# Patient Record
Sex: Female | Born: 1990 | Hispanic: No | Marital: Single | Smoking: Never smoker
Health system: Southern US, Community
[De-identification: ages and names within clinical notes are randomized; demographics above are authoritative.]

## PROBLEM LIST (undated history)

## (undated) ENCOUNTER — Inpatient Hospital Stay (HOSPITAL_COMMUNITY): Payer: Self-pay

## (undated) DIAGNOSIS — IMO0002 Reserved for concepts with insufficient information to code with codable children: Secondary | ICD-10-CM

## (undated) DIAGNOSIS — F419 Anxiety disorder, unspecified: Secondary | ICD-10-CM

## (undated) DIAGNOSIS — N201 Calculus of ureter: Secondary | ICD-10-CM

## (undated) DIAGNOSIS — Z9851 Tubal ligation status: Secondary | ICD-10-CM

## (undated) DIAGNOSIS — B379 Candidiasis, unspecified: Secondary | ICD-10-CM

---
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
## 2010-05-17 ENCOUNTER — Emergency Department (HOSPITAL_COMMUNITY): Admission: EM | Admit: 2010-05-17 | Discharge: 2010-05-17 | Payer: Self-pay | Admitting: Emergency Medicine

## 2010-06-15 ENCOUNTER — Emergency Department (HOSPITAL_COMMUNITY): Admission: EM | Admit: 2010-06-15 | Discharge: 2010-06-16 | Payer: Self-pay | Admitting: Emergency Medicine

## 2010-12-23 LAB — URINALYSIS, ROUTINE W REFLEX MICROSCOPIC
Bilirubin Urine: NEGATIVE
Nitrite: NEGATIVE
Specific Gravity, Urine: 1.021 (ref 1.005–1.030)
Urobilinogen, UA: 0.2 mg/dL (ref 0.0–1.0)

## 2010-12-23 LAB — URINE MICROSCOPIC-ADD ON

## 2010-12-23 LAB — POCT PREGNANCY, URINE: Preg Test, Ur: NEGATIVE

## 2011-08-08 ENCOUNTER — Inpatient Hospital Stay (HOSPITAL_COMMUNITY)
Admission: AD | Admit: 2011-08-08 | Discharge: 2011-08-08 | Disposition: A | Discharge status: Home / Self Care | Payer: Medicaid Other | Source: Ambulatory Visit | Attending: Obstetrics & Gynecology | Admitting: Obstetrics & Gynecology

## 2011-08-08 ENCOUNTER — Inpatient Hospital Stay (HOSPITAL_COMMUNITY): Payer: Medicaid Other

## 2011-08-08 ENCOUNTER — Encounter (HOSPITAL_COMMUNITY): Payer: Self-pay | Admitting: *Deleted

## 2011-08-08 DIAGNOSIS — R109 Unspecified abdominal pain: Secondary | ICD-10-CM | POA: Insufficient documentation

## 2011-08-08 DIAGNOSIS — O26899 Other specified pregnancy related conditions, unspecified trimester: Secondary | ICD-10-CM

## 2011-08-08 DIAGNOSIS — O99891 Other specified diseases and conditions complicating pregnancy: Secondary | ICD-10-CM | POA: Insufficient documentation

## 2011-08-08 DIAGNOSIS — N949 Unspecified condition associated with female genital organs and menstrual cycle: Secondary | ICD-10-CM

## 2011-08-08 LAB — WET PREP, GENITAL

## 2011-08-08 LAB — URINALYSIS, ROUTINE W REFLEX MICROSCOPIC
Bilirubin Urine: NEGATIVE
Nitrite: NEGATIVE
Specific Gravity, Urine: 1.015 (ref 1.005–1.030)
pH: 7 (ref 5.0–8.0)

## 2011-08-08 LAB — CBC
MCV: 93.4 fL (ref 78.0–100.0)
Platelets: 257 10*3/uL (ref 150–400)
RBC: 4.38 MIL/uL (ref 3.87–5.11)
WBC: 17.3 10*3/uL — ABNORMAL HIGH (ref 4.0–10.5)

## 2011-08-08 LAB — ABO/RH: ABO/RH(D): O POS

## 2011-08-08 NOTE — ED Provider Notes (Signed)
History     Chief Complaint  Patient presents with  . Abdominal Pain   HPI Marie Holt 20 y.o.  Positive pregnancy test in MAU.  History of periodic stabbing abdominal pain for 3 days.  No vaginal bleeding.  LMP first of Sept.   OB History    Grav Para Term Preterm Abortions TAB SAB Ect Mult Living   0               Past Medical History  Diagnosis Date  . Asthma     Past Surgical History  Procedure Date  . No past surgeries     No family history on file.  History  Substance Use Topics  . Smoking status: Never Smoker   . Smokeless tobacco: Not on file  . Alcohol Use: No    Allergies: No Known Allergies  Prescriptions prior to admission  Medication Sig Dispense Refill  . budesonide-formoterol (SYMBICORT) 160-4.5 MCG/ACT inhaler Inhale 2 puffs into the lungs 2 (two) times daily.          Review of Systems  Gastrointestinal: Positive for abdominal pain.   Physical Exam   Blood pressure 116/75, pulse 86, temperature 98.7 F (37.1 C), temperature source Oral, resp. rate 16, height 5\' 2"  (1.575 m), weight 111 lb 9.6 oz (50.621 kg).  Physical Exam  Nursing note and vitals reviewed. Constitutional: She is oriented to person, place, and time. She appears well-developed and well-nourished.  HENT:  Head: Normocephalic.  Eyes: EOM are normal.  Neck: Neck supple.  GI: Soft. There is no tenderness. There is no rebound and no guarding.  Genitourinary:       Speculum exam: Vagina - Small amount of creamy discharge, no odor Cervix - No contact bleeding Bimanual exam: Cervix closed Uterus mildly tender, 4-5 week size Adnexa non tender, no masses bilaterally GC/Chlam, wet prep done Chaperone present for exam.  Musculoskeletal: Normal range of motion.  Neurological: She is alert and oriented to person, place, and time.  Skin: Skin is warm and dry.  Psychiatric: She has a normal mood and affect.    MAU Course  Procedures Results for orders placed during the  hospital encounter of 08/08/11 (from the past 24 hour(s))  URINALYSIS, ROUTINE W REFLEX MICROSCOPIC     Status: Normal   Collection Time   08/08/11  8:55 PM      Component Value Range   Color, Urine YELLOW  YELLOW    Appearance CLEAR  CLEAR    Specific Gravity, Urine 1.015  1.005 - 1.030    pH 7.0  5.0 - 8.0    Glucose, UA NEGATIVE  NEGATIVE (mg/dL)   Hgb urine dipstick NEGATIVE  NEGATIVE    Bilirubin Urine NEGATIVE  NEGATIVE    Ketones, ur NEGATIVE  NEGATIVE (mg/dL)   Protein, ur NEGATIVE  NEGATIVE (mg/dL)   Urobilinogen, UA 0.2  0.0 - 1.0 (mg/dL)   Nitrite NEGATIVE  NEGATIVE    Leukocytes, UA NEGATIVE  NEGATIVE   POCT PREGNANCY, URINE     Status: Normal   Collection Time   08/08/11  9:16 PM      Component Value Range   Preg Test, Ur POSITIVE    CBC     Status: Abnormal   Collection Time   08/08/11  9:40 PM      Component Value Range   WBC 17.3 (*) 4.0 - 10.5 (K/uL)   RBC 4.38  3.87 - 5.11 (MIL/uL)   Hemoglobin 13.8  12.0 -  15.0 (g/dL)   HCT 16.1  09.6 - 04.5 (%)   MCV 93.4  78.0 - 100.0 (fL)   MCH 31.5  26.0 - 34.0 (pg)   MCHC 33.7  30.0 - 36.0 (g/dL)   RDW 40.9  81.1 - 91.4 (%)   Platelets 257  150 - 400 (K/uL)  ABO/RH     Status: Normal   Collection Time   08/08/11  9:40 PM      Component Value Range   ABO/RH(D) O POS    HCG, QUANTITATIVE, PREGNANCY     Status: Abnormal   Collection Time   08/08/11  9:40 PM      Component Value Range   hCG, Beta Chain, Quant, S 5212 (*) <5 (mIU/mL)  WET PREP, GENITAL     Status: Abnormal   Collection Time   08/08/11 10:00 PM      Component Value Range   Yeast, Wet Prep NONE SEEN  NONE SEEN    Trich, Wet Prep NONE SEEN  NONE SEEN    Clue Cells, Wet Prep MODERATE (*) NONE SEEN    WBC, Wet Prep HPF POC FEW (*) NONE SEEN     MDM Ultrasound Clinical Data: Abdominal pain. Pregnant.  OBSTETRIC <14 WK [place] AND TRANSVAGINAL OB US  Technique: Both transabdominal and transvaginal ultrasound  examinations were performed for complete  evaluation of the  gestation as well as the maternal uterus, adnexal regions, and  pelvic cul-de-sac. Transvaginal technique was performed to assess  early pregnancy.  Comparison: None.  Intrauterine gestational sac: Visualized/normal in shape.  Yolk sac: Not visualized  Embryo: Not visualized  Cardiac Activity: Not visualized  Heart Rate: Not visualized bpm  MSD: 5.2 mm 5 w 0 d  CRL: mm w d [place] EDC:  Maternal uterus/adnexae:  Normal ovaries. Corpus luteum cyst on the left. No free fluid  IMPRESSION:  Gestational sac is present corresponding to 5-week-0-day gestation.  Fetal pole nor yolk sac visualized. Follow-up suggested.    Assessment and Plan  Abdominal pain in pregnancy  Plan: Return on Wed, Nov 7 between 8 am and 12 noon for repeat ultrasound. Return sooner if you have severe pain or severe bleeding.   Marie Holt 08/08/2011, 10:01 PM   Marie Bernheim, NP 08/08/11 2250

## 2011-08-08 NOTE — Progress Notes (Signed)
Pt unknown LMP, thinks it was sometime early September, having lower abd pain x 2-3 days.  Pt reports increased clear discharge, denies bleeding.

## 2011-08-09 LAB — GC/CHLAMYDIA PROBE AMP, GENITAL
Chlamydia, DNA Probe: NEGATIVE
GC Probe Amp, Genital: NEGATIVE

## 2011-08-13 ENCOUNTER — Inpatient Hospital Stay (HOSPITAL_COMMUNITY)
Admission: AD | Admit: 2011-08-13 | Discharge: 2011-08-13 | Disposition: A | Discharge status: Home / Self Care | Payer: Medicaid Other | Source: Ambulatory Visit | Attending: Obstetrics and Gynecology | Admitting: Obstetrics and Gynecology

## 2011-08-13 ENCOUNTER — Encounter (HOSPITAL_COMMUNITY): Payer: Self-pay

## 2011-08-13 DIAGNOSIS — O210 Mild hyperemesis gravidarum: Secondary | ICD-10-CM | POA: Insufficient documentation

## 2011-08-13 DIAGNOSIS — O211 Hyperemesis gravidarum with metabolic disturbance: Secondary | ICD-10-CM

## 2011-08-13 LAB — URINALYSIS, ROUTINE W REFLEX MICROSCOPIC
Bilirubin Urine: NEGATIVE
Glucose, UA: NEGATIVE mg/dL
Specific Gravity, Urine: >1.03 — ABNORMAL HIGH (ref 1.005–1.030)
pH: 6 (ref 5.0–8.0)

## 2011-08-13 LAB — COMPREHENSIVE METABOLIC PANEL
ALT: 9 U/L (ref 0–35)
CO2: 24 mEq/L (ref 19–32)
Calcium: 10.6 mg/dL — ABNORMAL HIGH (ref 8.4–10.5)
Creatinine, Ser: 0.65 mg/dL (ref 0.50–1.10)
GFR calc Af Amer: >90 mL/min (ref >90)
GFR calc non Af Amer: >90 mL/min (ref >90)
Glucose, Bld: 92 mg/dL (ref 70–99)
Total Bilirubin: 1.3 mg/dL — ABNORMAL HIGH (ref 0.3–1.2)

## 2011-08-13 LAB — URINE MICROSCOPIC-ADD ON

## 2011-08-13 LAB — CBC
Hemoglobin: 14 g/dL (ref 12.0–15.0)
MCH: 31.8 pg (ref 26.0–34.0)
MCV: 93.6 fL (ref 78.0–100.0)
RBC: 4.4 MIL/uL (ref 3.87–5.11)

## 2011-08-13 MED ORDER — PROMETHAZINE HCL 25 MG/ML IJ SOLN
25.0000 mg | Freq: Once | INTRAVENOUS | Status: AC
  Administered 2011-08-13: 25 mg via INTRAVENOUS
  Filled 2011-08-13: qty 1

## 2011-08-13 MED ORDER — ONDANSETRON 8 MG PO TBDP: 8.0000 mg | ORAL_TABLET | Freq: Three times a day (TID) | ORAL | Status: AC | PRN

## 2011-08-13 MED ORDER — ONDANSETRON HCL 4 MG/2ML IJ SOLN
4.0000 mg | Freq: Once | INTRAMUSCULAR | Status: AC
  Administered 2011-08-13: 4 mg via INTRAVENOUS
  Filled 2011-08-13: qty 2

## 2011-08-13 MED ORDER — PROMETHAZINE HCL 25 MG PO TABS: 25.0000 mg | ORAL_TABLET | Freq: Four times a day (QID) | ORAL | Status: DC | PRN

## 2011-08-13 NOTE — Progress Notes (Signed)
Since yesterday morning vomiting, can't keep anything down, has not started prenatal care.

## 2011-08-13 NOTE — ED Provider Notes (Signed)
History     Chief Complaint  Patient presents with  . Morning Sickness   HPI Marie Holt 20 y.o. 5w 4d gestation.  Started vomiting repeatedly yesterday and is not able to keep down food or fluids.  No medications at home.   OB History    Grav Para Term Preterm Abortions TAB SAB Ect Mult Living   1               Past Medical History  Diagnosis Date  . Asthma     Past Surgical History  Procedure Date  . No past surgeries     No family history on file.  History  Substance Use Topics  . Smoking status: Never Smoker   . Smokeless tobacco: Not on file  . Alcohol Use: No    Allergies: No Known Allergies  Prescriptions prior to admission  Medication Sig Dispense Refill  . budesonide-formoterol (SYMBICORT) 160-4.5 MCG/ACT inhaler Inhale 2 puffs into the lungs 2 (two) times daily.          Review of Systems  Gastrointestinal: Positive for nausea and vomiting.   Physical Exam   Blood pressure 117/69, pulse 107, temperature 97.9 F (36.6 C), temperature source Oral, resp. rate 16, height 5\' 2"  (1.575 m), weight 107 lb (48.535 kg).  Physical Exam  Nursing note and vitals reviewed. Constitutional: She is oriented to person, place, and time. She appears well-developed and well-nourished.  HENT:  Head: Normocephalic.  Eyes: EOM are normal.  Neck: Neck supple.  GI: Soft. There is no tenderness.  Musculoskeletal: Normal range of motion.  Neurological: She is alert and oriented to person, place, and time.  Skin: Skin is warm and dry.  Psychiatric: She has a normal mood and affect.    MAU Course  Procedures  MDM Results for orders placed during the hospital encounter of 08/13/11 (from the past 24 hour(s))  URINALYSIS, ROUTINE W REFLEX MICROSCOPIC     Status: Abnormal   Collection Time   08/13/11  7:54 AM      Component Value Range   Color, Urine YELLOW  YELLOW    Appearance HAZY (*) CLEAR    Specific Gravity, Urine >1.030 (*) 1.005 - 1.030    pH 6.0  5.0 -  8.0    Glucose, UA NEGATIVE  NEGATIVE (mg/dL)   Hgb urine dipstick TRACE (*) NEGATIVE    Bilirubin Urine NEGATIVE  NEGATIVE    Ketones, ur >80 (*) NEGATIVE (mg/dL)   Protein, ur NEGATIVE  NEGATIVE (mg/dL)   Urobilinogen, UA 0.2  0.0 - 1.0 (mg/dL)   Nitrite NEGATIVE  NEGATIVE    Leukocytes, UA NEGATIVE  NEGATIVE   URINE MICROSCOPIC-ADD ON     Status: Abnormal   Collection Time   08/13/11  7:54 AM      Component Value Range   Squamous Epithelial / LPF FEW (*) RARE    WBC, UA 0-2  <3 (WBC/hpf)   RBC / HPF 3-6  <3 (RBC/hpf)   Bacteria, UA FEW (*) RARE    Urine-Other MUCOUS PRESENT    CBC     Status: Abnormal   Collection Time   08/13/11  8:30 AM      Component Value Range   WBC 14.7 (*) 4.0 - 10.5 (K/uL)   RBC 4.40  3.87 - 5.11 (MIL/uL)   Hemoglobin 14.0  12.0 - 15.0 (g/dL)   HCT 16.1  09.6 - 04.5 (%)   MCV 93.6  78.0 - 100.0 (fL)  MCH 31.8  26.0 - 34.0 (pg)   MCHC 34.0  30.0 - 36.0 (g/dL)   RDW 08.6  57.8 - 46.9 (%)   Platelets 242  150 - 400 (K/uL)  COMPREHENSIVE METABOLIC PANEL     Status: Abnormal   Collection Time   08/13/11  8:30 AM      Component Value Range   Sodium 132 (*) 135 - 145 (mEq/L)   Potassium 4.1  3.5 - 5.1 (mEq/L)   Chloride 97  96 - 112 (mEq/L)   CO2 24  19 - 32 (mEq/L)   Glucose, Bld 92  70 - 99 (mg/dL)   BUN 10  6 - 23 (mg/dL)   Creatinine, Ser 6.29  0.50 - 1.10 (mg/dL)   Calcium 52.8 (*) 8.4 - 10.5 (mg/dL)   Total Protein 7.4  6.0 - 8.3 (g/dL)   Albumin 4.4  3.5 - 5.2 (g/dL)   AST 13  0 - 37 (U/L)   ALT 9  0 - 35 (U/L)   Alkaline Phosphatase 73  39 - 117 (U/L)   Total Bilirubin 1.3 (*) 0.3 - 1.2 (mg/dL)   GFR calc non Af Amer >90  >90 (mL/min)   GFR calc Af Amer >90  >90 (mL/min)   Urine culture pending - blood noted on micro Phenergan in 1000cc LR given with Zofran 4 mg IV also.  Client able to keep down ice chips well.  Assessment and Plan  Hyperemesis  Plan: Will prescribe Zofran ODT and Phenergan tablets to use at home. Begin prenatal  care as soon as possible. Drink at least 8 8-oz glasses of water every day.   Dylyn Mclaren 08/13/2011, 8:25 AM   [name], NP 08/13/11 1043

## 2011-08-14 LAB — URINE CULTURE

## 2011-08-14 NOTE — ED Provider Notes (Signed)
Attestation of Attending Supervision of Advanced Practitioner: Evaluation and management procedures were performed by the PA/NP/CNM/OB Fellow under my supervision/collaboration. Chart reviewed and agree with management and plan.  Kendyn Zaman V 08/14/2011 9:14 AM

## 2011-08-16 ENCOUNTER — Inpatient Hospital Stay (HOSPITAL_COMMUNITY): Payer: Self-pay

## 2011-08-16 ENCOUNTER — Inpatient Hospital Stay (HOSPITAL_COMMUNITY)
Admission: AD | Admit: 2011-08-16 | Discharge: 2011-08-16 | Disposition: A | Discharge status: Home / Self Care | Payer: Self-pay | Source: Ambulatory Visit | Attending: Obstetrics & Gynecology | Admitting: Obstetrics & Gynecology

## 2011-08-16 ENCOUNTER — Encounter (HOSPITAL_COMMUNITY): Payer: Self-pay | Admitting: *Deleted

## 2011-08-16 DIAGNOSIS — Z3490 Encounter for supervision of normal pregnancy, unspecified, unspecified trimester: Secondary | ICD-10-CM

## 2011-08-16 DIAGNOSIS — Z1389 Encounter for screening for other disorder: Secondary | ICD-10-CM

## 2011-08-16 DIAGNOSIS — Z363 Encounter for antenatal screening for malformations: Secondary | ICD-10-CM

## 2011-08-16 DIAGNOSIS — O99891 Other specified diseases and conditions complicating pregnancy: Secondary | ICD-10-CM | POA: Insufficient documentation

## 2011-08-16 NOTE — ED Provider Notes (Signed)
History     Chief Complaint  Patient presents with  . Follow-up   HPI Marie Holt 20 y.o. 6w 0d gestation.  Comes to MAU today for repeat ultrasound.   OB History    Grav Para Term Preterm Abortions TAB SAB Ect Mult Living   1               Past Medical History  Diagnosis Date  . Asthma     Past Surgical History  Procedure Date  . No past surgeries     Family History  Problem Relation Age of Onset  . Anesthesia problems Neg Hx     History  Substance Use Topics  . Smoking status: Never Smoker   . Smokeless tobacco: Never Used  . Alcohol Use: No    Allergies: No Known Allergies  Prescriptions prior to admission  Medication Sig Dispense Refill  . budesonide-formoterol (SYMBICORT) 160-4.5 MCG/ACT inhaler Inhale 2 puffs into the lungs 2 (two) times daily.        . ondansetron (ZOFRAN ODT) 8 MG disintegrating tablet Take 1 tablet (8 mg total) by mouth every 8 (eight) hours as needed for nausea.  20 tablet  0  . promethazine (PHENERGAN) 25 MG tablet Take 1 tablet (25 mg total) by mouth every 6 (six) hours as needed for nausea. May take 1/2 tablet for milder symptoms.  Sedation precautions.  30 tablet  0    ROS Physical Exam   Blood pressure 111/72, pulse 98, temperature 98.4 F (36.9 C), temperature source Oral, resp. rate 18, SpO2 98.00%.  Physical Exam  MAU Course  Procedures  6w 1d viable pregnancy  Assessment and Plan  Viable pregnancy  Plan: Begin prenatal care   Neil Brickell 08/16/2011, 8:43 AM   [name], NP 08/16/11 [phone]

## 2011-08-16 NOTE — Progress Notes (Signed)
Here today for viability scan.  Doing ok, feels sick- is taking meds which do help.  No pain or bleeding.

## 2011-08-17 NOTE — ED Provider Notes (Signed)
Attestation of Attending Supervision of Advanced Practitioner: Evaluation and management procedures were performed by the PA/NP/CNM/OB Fellow under my supervision/collaboration. Chart reviewed, and agree with management and plan.  Wayburn Shaler A M.D. 08/17/2011 1:27 PM   

## 2011-10-10 NOTE — L&D Delivery Note (Signed)
Delivery Note  Cervix was complete at 0111 and pt was feeling inc pressure, FHR remained reassuring w mod variable decels, pt began pushing at 0120,   At 2:04 AM a viable female was delivered via Vaginal, Spontaneous Delivery (Presentation: Right Occiput Anterior). Somersault  Through loose nuchal cord, shoulders deliv easily  APGAR: 9, 10; weight .pending   Placenta status: Intact, Spontaneous.  Cord: 3 vessels with the following complications: None.  Cord pH: n/a cytotec PR for inc vag bleeding  Anesthesia: Epidural  Episiotomy: Midline Lacerations: 2nd degree Suture Repair: 3.0 vicryl rapide Est. Blood Loss (mL): 300  Mom to postpartum.  Baby to nursery-stable . Skin-skin w mom Mom plans to BF  Marie Holt M 04/01/2012, 2:35 AM

## 2011-12-11 ENCOUNTER — Encounter: Payer: Self-pay | Admitting: Obstetrics and Gynecology

## 2011-12-15 ENCOUNTER — Encounter (INDEPENDENT_AMBULATORY_CARE_PROVIDER_SITE_OTHER): Payer: Medicaid Other | Admitting: Obstetrics and Gynecology

## 2011-12-15 DIAGNOSIS — Z3400 Encounter for supervision of normal first pregnancy, unspecified trimester: Secondary | ICD-10-CM

## 2011-12-18 ENCOUNTER — Encounter: Payer: Self-pay | Admitting: Obstetrics and Gynecology

## 2012-01-12 ENCOUNTER — Encounter: Payer: Medicaid Other | Admitting: Obstetrics and Gynecology

## 2012-01-15 ENCOUNTER — Encounter: Payer: Medicaid Other | Admitting: Registered Nurse

## 2012-01-29 ENCOUNTER — Encounter: Payer: Self-pay | Admitting: Obstetrics and Gynecology

## 2012-01-29 ENCOUNTER — Encounter: Payer: Medicaid Other | Admitting: Obstetrics and Gynecology

## 2012-01-29 DIAGNOSIS — O0930 Supervision of pregnancy with insufficient antenatal care, unspecified trimester: Secondary | ICD-10-CM | POA: Insufficient documentation

## 2012-01-29 DIAGNOSIS — N926 Irregular menstruation, unspecified: Secondary | ICD-10-CM | POA: Insufficient documentation

## 2012-01-29 DIAGNOSIS — J45909 Unspecified asthma, uncomplicated: Secondary | ICD-10-CM | POA: Insufficient documentation

## 2012-01-29 DIAGNOSIS — K089 Disorder of teeth and supporting structures, unspecified: Secondary | ICD-10-CM | POA: Insufficient documentation

## 2012-02-19 ENCOUNTER — Encounter: Payer: Self-pay | Admitting: Obstetrics and Gynecology

## 2012-02-19 ENCOUNTER — Ambulatory Visit (INDEPENDENT_AMBULATORY_CARE_PROVIDER_SITE_OTHER): Payer: Medicaid Other | Admitting: Obstetrics and Gynecology

## 2012-02-19 VITALS — BP 98/70 | Wt 122.0 lb

## 2012-02-19 DIAGNOSIS — Z331 Pregnant state, incidental: Secondary | ICD-10-CM

## 2012-02-19 NOTE — Progress Notes (Signed)
Patient ID: Marie Holt, female   DOB: 1991/04/14, 21 y.o.   MRN: [id number] EGBUS perineum wnl, neg clue, trich, and hyphae discussed. Reviewed s/s preterm labor, srom, vag bleeding, kick counts to report, enc 8 water daily and frequent voids 1 gtt, rpr, hgb today is RH positive Lavera Guise, CNM

## 2012-02-19 NOTE — Progress Notes (Signed)
Pt c/o of discharge & itching

## 2012-02-20 LAB — CBC
MCV: 96.3 fL (ref 78.0–100.0)
Platelets: 226 10*3/uL (ref 150–400)
RDW: 12.8 % (ref 11.5–15.5)
WBC: 8.9 10*3/uL (ref 4.0–10.5)

## 2012-02-20 LAB — RPR

## 2012-02-26 ENCOUNTER — Telehealth: Payer: Self-pay | Admitting: Obstetrics and Gynecology

## 2012-02-26 NOTE — Telephone Encounter (Signed)
Incoming call to CNM, pt c/o "swolen" vagina since yesterday, denies any pain, ctx, VB or LOF, no vag d/c, +FM Pt is [redacted]w[redacted]d Per M.Krebsbach, CNM exam 1wk ago normal rec no use of scented body wash, switch to plain dial soap Reassured pt and to call for [place] visit in AM if not improved overnight

## 2012-02-27 ENCOUNTER — Ambulatory Visit (INDEPENDENT_AMBULATORY_CARE_PROVIDER_SITE_OTHER): Payer: Medicaid Other | Admitting: Obstetrics and Gynecology

## 2012-02-27 ENCOUNTER — Encounter: Payer: Self-pay | Admitting: Obstetrics and Gynecology

## 2012-02-27 ENCOUNTER — Telehealth: Payer: Self-pay | Admitting: Obstetrics and Gynecology

## 2012-02-27 VITALS — BP 106/56 | Wt 123.0 lb

## 2012-02-27 DIAGNOSIS — B373 Candidiasis of vulva and vagina: Secondary | ICD-10-CM

## 2012-02-27 DIAGNOSIS — O26899 Other specified pregnancy related conditions, unspecified trimester: Secondary | ICD-10-CM

## 2012-02-27 DIAGNOSIS — O2343 Unspecified infection of urinary tract in pregnancy, third trimester: Secondary | ICD-10-CM

## 2012-02-27 DIAGNOSIS — N898 Other specified noninflammatory disorders of vagina: Secondary | ICD-10-CM

## 2012-02-27 DIAGNOSIS — Z331 Pregnant state, incidental: Secondary | ICD-10-CM

## 2012-02-27 DIAGNOSIS — O9989 Other specified diseases and conditions complicating pregnancy, childbirth and the puerperium: Secondary | ICD-10-CM

## 2012-02-27 DIAGNOSIS — Z283 Underimmunization status: Secondary | ICD-10-CM | POA: Insufficient documentation

## 2012-02-27 LAB — POCT WET PREP (WET MOUNT): Clue Cells Wet Prep Whiff POC: NEGATIVE

## 2012-02-27 LAB — POCT URINALYSIS DIPSTICK
Blood, UA: NEGATIVE
Glucose, UA: NEGATIVE
Nitrite, UA: POSITIVE
Urobilinogen, UA: NEGATIVE

## 2012-02-27 MED ORDER — NITROFURANTOIN MONOHYD MACRO 100 MG PO CAPS: 100.0000 mg | ORAL_CAPSULE | Freq: Two times a day (BID) | ORAL | Status: AC

## 2012-02-27 MED ORDER — NYSTATIN-TRIAMCINOLONE 100000-0.1 UNIT/GM-% EX OINT: TOPICAL_OINTMENT | Freq: Three times a day (TID) | CUTANEOUS | Status: DC | PRN

## 2012-02-27 MED ORDER — TERCONAZOLE 0.4 % VA CREA: 1.0000 | TOPICAL_CREAM | Freq: Every day | VAGINAL | Status: DC

## 2012-02-27 NOTE — Progress Notes (Signed)
Pt complaining of thick white discharge and swollen with itching x 3days JM

## 2012-02-27 NOTE — Progress Notes (Signed)
U/A with 2+ leuk and positive nitrites: to culture, Macrobid BID for 7 days Wet prep: Hyphae : Terazol 7 and Mycolog II GBS at next visit S<D with poor weigh gain: ultrasound ordered

## 2012-02-28 LAB — CULTURE, OB URINE: Colony Count: NO GROWTH

## 2012-03-05 ENCOUNTER — Ambulatory Visit (INDEPENDENT_AMBULATORY_CARE_PROVIDER_SITE_OTHER): Payer: Medicaid Other | Admitting: Obstetrics and Gynecology

## 2012-03-05 ENCOUNTER — Encounter: Payer: Self-pay | Admitting: Obstetrics and Gynecology

## 2012-03-05 ENCOUNTER — Ambulatory Visit (INDEPENDENT_AMBULATORY_CARE_PROVIDER_SITE_OTHER): Payer: Medicaid Other

## 2012-03-05 VITALS — BP 108/60 | Ht 62.0 in | Wt 122.0 lb

## 2012-03-05 DIAGNOSIS — K089 Disorder of teeth and supporting structures, unspecified: Secondary | ICD-10-CM

## 2012-03-05 DIAGNOSIS — R110 Nausea: Secondary | ICD-10-CM

## 2012-03-05 DIAGNOSIS — B49 Unspecified mycosis: Secondary | ICD-10-CM

## 2012-03-05 DIAGNOSIS — B379 Candidiasis, unspecified: Secondary | ICD-10-CM

## 2012-03-05 DIAGNOSIS — O26899 Other specified pregnancy related conditions, unspecified trimester: Secondary | ICD-10-CM

## 2012-03-05 DIAGNOSIS — B373 Candidiasis of vulva and vagina: Secondary | ICD-10-CM

## 2012-03-05 DIAGNOSIS — O0930 Supervision of pregnancy with insufficient antenatal care, unspecified trimester: Secondary | ICD-10-CM

## 2012-03-05 DIAGNOSIS — O2610 Low weight gain in pregnancy, unspecified trimester: Secondary | ICD-10-CM | POA: Insufficient documentation

## 2012-03-05 DIAGNOSIS — O3660X0 Maternal care for excessive fetal growth, unspecified trimester, not applicable or unspecified: Secondary | ICD-10-CM

## 2012-03-05 LAB — POCT WET PREP (WET MOUNT)
Bacteria Wet Prep HPF POC: NEGATIVE
Source Wet Prep POC: NEGATIVE
WBC, Wet Prep HPF POC: NEGATIVE

## 2012-03-05 LAB — US OB FOLLOW UP

## 2012-03-05 MED ORDER — RANITIDINE HCL 150 MG PO TABS: 150.0000 mg | ORAL_TABLET | Freq: Two times a day (BID) | ORAL | Status: DC

## 2012-03-05 MED ORDER — TERCONAZOLE 0.4 % VA CREA: 1.0000 | TOPICAL_CREAM | Freq: Every day | VAGINAL | Status: AC

## 2012-03-05 MED ORDER — ENSURE NUTRA SHAKE HI-CAL PO LIQD: 1.0000 | Freq: Every day | ORAL | Status: DC

## 2012-03-05 NOTE — Patient Instructions (Signed)
High Protein, High Calorie Diet  A high protein, high calorie diet increases the amount of protein and calories you eat. You may need more protein and calories in your diet because of illness, surgery, injury, weight loss, or having a poor appetite. Eating high protein and high calorie foods can help you gain weight, heal, and recover after illness.    SERVING SIZES  Measuring foods and serving sizes helps to make sure you are getting the right amount of food. The list below tells how big or small some common serving sizes are.     1 oz.........4 stacked dice.    3 oz.........Deck of cards.    1 tsp........Tip of little finger.    1 tbs........Thumb.    2 tbs........Golf ball.     cup.......Half of a fist.    1 cup........A fist.   HIGH PROTEIN FOODS  Dairy   Whole milk.    Whole milk yogurt.    Powdered milk.    Cheese.    Cottage Cheese.    Instant breakfast products.    Eggnog.   Tips for adding to your diet:   Use whole milk when making hot cereal, puddings, soups, and hot cocoa.    Add powdered milk to baked goods, smoothies, and milkshakes.    Make whole milk yogurt parfaits by adding granola, fruit, or nuts.    Add cheese to sandwiches, pastas, soups, and casseroles.    Add fruit to cottage cheese.   Meat    Beef, pork, and poultry.    Fish and seafood.    Peanut butter.    Dried beans.    Eggs.   Tips for adding to your diet:   Make meat and cheese omelets.    Add eggs to salads and baked goods.    Add fish and seafood to salads.    Add meat and poultry to casseroles, salads, and soups.    Use peanut butter as a topping for pretzels, celery, crackers, or add it to baked goods.    Use beans in casseroles, dips, and spreads.   GENERAL GUIDELINES TO INCREASE CALORIES   Replace calorie-free drinks with calorie-containing drinks, such as milk, fruit juices, regular soda, milkshakes, and hot chocolate.    Try to eat 6 small meals instead of 3 large meals each day.     Keep snacks handy, such as nuts, trail mixes, dried fruit, and yogurt.    Choose foods with sauces and gravies.    Add dried fruits, honey, and half-and-half to hot or cold cereal.    Add extra fats when possible, such as butter, sour cream, cream cheese, and salad dressings.    Add cheese to foods often.    Consider adding a clear liquid nutritional supplement to your diet. Your caregiver can give you recommendations.   HIGH CALORIE FOODS  Grain/Starch   Baked goods, such as muffins and quick breads.    Croissants.    Pancakes and waffles.   Vegetable    Sauted vegetables in oil.    Fried vegetables.    Salad greens with regular salad dressing or vinegar and oil.   Fruit   Dried fruit.    Canned fruit in syrup.    Fruit juice.   Fat   Avocado.    Butter or margarine.    Whipped cream.    Mayonnaise.    Salad dressing.    Peanuts and mixed nuts.    Cream cheese and sour cream.   

## 2012-03-05 NOTE — Progress Notes (Signed)
1) [place] EFW  5lb 3 oz 27.6%ile Nl fluid vtx  2) Poor wt gain.  Pt states nausea as soon as she starts eating makes her unable to eat well Rec;  a) Zantac bid  b) Ensure qd  C) high calorie, high protein diet instructions 3) Irritation.  Wet Prep= yeast.   Rec: Repeat Terazol 7 4) Checklist completed

## 2012-03-19 ENCOUNTER — Encounter: Payer: Self-pay | Admitting: Obstetrics and Gynecology

## 2012-03-19 ENCOUNTER — Ambulatory Visit (INDEPENDENT_AMBULATORY_CARE_PROVIDER_SITE_OTHER): Payer: Medicaid Other | Admitting: Obstetrics and Gynecology

## 2012-03-19 ENCOUNTER — Encounter: Payer: Medicaid Other | Admitting: Obstetrics and Gynecology

## 2012-03-19 VITALS — BP 98/58 | Wt 125.0 lb

## 2012-03-19 DIAGNOSIS — O368190 Decreased fetal movements, unspecified trimester, not applicable or unspecified: Secondary | ICD-10-CM

## 2012-03-19 DIAGNOSIS — N898 Other specified noninflammatory disorders of vagina: Secondary | ICD-10-CM

## 2012-03-19 DIAGNOSIS — Z331 Pregnant state, incidental: Secondary | ICD-10-CM

## 2012-03-19 LAB — POCT WET PREP (WET MOUNT)

## 2012-03-19 MED ORDER — TERCONAZOLE 80 MG VA SUPP: 80.0000 mg | Freq: Every day | VAGINAL | Status: DC

## 2012-03-19 NOTE — Progress Notes (Addendum)
Pt c/o vaginal itching and irritation and does not think the rx for yeast infection is working.  Pt also c/o decreased fetal movement BP 98/58  Wt 125 lb (56.7 kg) V/v white d/c wet prep c/w yeast  terazol rx given NST for decreased fetal movemnt FK reviewed NST 130s reacvtive   No ctx

## 2012-03-19 NOTE — Patient Instructions (Signed)
Fetal Movement Counts Patient Name: __________________________________________________ Patient Due Date: ____________________ Kick counts is highly recommended in high risk pregnancies, but it is a good idea for every pregnant woman to do. Start counting fetal movements at 28 weeks of the pregnancy. Fetal movements increase after eating a full meal or eating or drinking something sweet (the blood sugar is higher). It is also important to drink plenty of fluids (well hydrated) before doing the count. Lie on your left side because it helps with the circulation or you can sit in a comfortable chair with your arms over your belly (abdomen) with no distractions around you. DOING THE COUNT  Try to do the count the same time of day each time you do it.   Mark the day and time, then see how long it takes for you to feel 10 movements (kicks, flutters, swishes, rolls). You should have at least 10 movements within 2 hours. You will most likely feel 10 movements in much less than 2 hours. If you do not, wait an hour and count again. After a couple of days you will see a pattern.   What you are looking for is a change in the pattern or not enough counts in 2 hours. Is it taking longer in time to reach 10 movements?  SEEK MEDICAL CARE IF:  You feel less than 10 counts in 2 hours. Tried twice.   No movement in one hour.   The pattern is changing or taking longer each day to reach 10 counts in 2 hours.   You feel the baby is not moving as it usually does.  Date: ____________ Movements: ____________ Start time: ____________ Finish time: ____________  Date: ____________ Movements: ____________ Start time: ____________ Finish time: ____________ Date: ____________ Movements: ____________ Start time: ____________ Finish time: ____________ Date: ____________ Movements: ____________ Start time: ____________ Finish time: ____________ Date: ____________ Movements: ____________ Start time: ____________ Finish time:  ____________ Date: ____________ Movements: ____________ Start time: ____________ Finish time: ____________ Date: ____________ Movements: ____________ Start time: ____________ Finish time: ____________ Date: ____________ Movements: ____________ Start time: ____________ Finish time: ____________  Date: ____________ Movements: ____________ Start time: ____________ Finish time: ____________ Date: ____________ Movements: ____________ Start time: ____________ Finish time: ____________ Date: ____________ Movements: ____________ Start time: ____________ Finish time: ____________ Date: ____________ Movements: ____________ Start time: ____________ Finish time: ____________ Date: ____________ Movements: ____________ Start time: ____________ Finish time: ____________ Date: ____________ Movements: ____________ Start time: ____________ Finish time: ____________ Date: ____________ Movements: ____________ Start time: ____________ Finish time: ____________  Date: ____________ Movements: ____________ Start time: ____________ Finish time: ____________ Date: ____________ Movements: ____________ Start time: ____________ Finish time: ____________ Date: ____________ Movements: ____________ Start time: ____________ Finish time: ____________ Date: ____________ Movements: ____________ Start time: ____________ Finish time: ____________ Date: ____________ Movements: ____________ Start time: ____________ Finish time: ____________ Date: ____________ Movements: ____________ Start time: ____________ Finish time: ____________ Date: ____________ Movements: ____________ Start time: ____________ Finish time: ____________  Date: ____________ Movements: ____________ Start time: ____________ Finish time: ____________ Date: ____________ Movements: ____________ Start time: ____________ Finish time: ____________ Date: ____________ Movements: ____________ Start time: ____________ Finish time: ____________ Date: ____________ Movements:  ____________ Start time: ____________ Finish time: ____________ Date: ____________ Movements: ____________ Start time: ____________ Finish time: ____________ Date: ____________ Movements: ____________ Start time: ____________ Finish time: ____________ Date: ____________ Movements: ____________ Start time: ____________ Finish time: ____________  Date: ____________ Movements: ____________ Start time: ____________ Finish time: ____________ Date: ____________ Movements: ____________ Start time: ____________ Finish time: ____________ Date: ____________ Movements: ____________ Start time:   ____________ Finish time: ____________ Date: ____________ Movements: ____________ Start time: ____________ Finish time: ____________ Date: ____________ Movements: ____________ Start time: ____________ Finish time: ____________ Date: ____________ Movements: ____________ Start time: ____________ Finish time: ____________ Date: ____________ Movements: ____________ Start time: ____________ Finish time: ____________  Date: ____________ Movements: ____________ Start time: ____________ Finish time: ____________ Date: ____________ Movements: ____________ Start time: ____________ Finish time: ____________ Date: ____________ Movements: ____________ Start time: ____________ Finish time: ____________ Date: ____________ Movements: ____________ Start time: ____________ Finish time: ____________ Date: ____________ Movements: ____________ Start time: ____________ Finish time: ____________ Date: ____________ Movements: ____________ Start time: ____________ Finish time: ____________ Date: ____________ Movements: ____________ Start time: ____________ Finish time: ____________  Date: ____________ Movements: ____________ Start time: ____________ Finish time: ____________ Date: ____________ Movements: ____________ Start time: ____________ Finish time: ____________ Date: ____________ Movements: ____________ Start time: ____________ Finish  time: ____________ Date: ____________ Movements: ____________ Start time: ____________ Finish time: ____________ Date: ____________ Movements: ____________ Start time: ____________ Finish time: ____________ Date: ____________ Movements: ____________ Start time: ____________ Finish time: ____________ Date: ____________ Movements: ____________ Start time: ____________ Finish time: ____________  Date: ____________ Movements: ____________ Start time: ____________ Finish time: ____________ Date: ____________ Movements: ____________ Start time: ____________ Finish time: ____________ Date: ____________ Movements: ____________ Start time: ____________ Finish time: ____________ Date: ____________ Movements: ____________ Start time: ____________ Finish time: ____________ Date: ____________ Movements: ____________ Start time: ____________ Finish time: ____________ Date: ____________ Movements: ____________ Start time: ____________ Finish time: ____________ Document Released: 10/25/2006 Document Revised: 09/14/2011 Document Reviewed: 04/27/2009 [employer] Patient Information 2012 [employer]. 

## 2012-03-21 ENCOUNTER — Other Ambulatory Visit: Payer: Self-pay | Admitting: Obstetrics and Gynecology

## 2012-03-21 ENCOUNTER — Telehealth: Payer: Self-pay | Admitting: Obstetrics and Gynecology

## 2012-03-21 MED ORDER — FLUCONAZOLE 150 MG PO TABS: 150.0000 mg | ORAL_TABLET | Freq: Once | ORAL | Status: AC

## 2012-03-21 NOTE — Telephone Encounter (Signed)
Triage/2nd call 

## 2012-03-21 NOTE — Telephone Encounter (Signed)
PT CALLED, STATES HAS YEAST INFECTION AND HAS NOT GOTTEN ANY RELIEF FROM THE TEREZOL 3 THAT WAS RX'D TO HER AND HAS WORSENED SXS.  PT HAS VAGINAL REDNESS, BURNING AND ITCHING AND IS PAINFUL, ALSO HAS A LOT OF WHITE D/C.  PT DENIES ANY BLDG, DOES HAVE +FM.  PER VL STOP TEREZOL, CAN RX DIFLUCAN 150MG  AND MYCOLOG CREAM.  DIFLUCAN WAS E-PRESCRIBED TO PT'S PHARM, PT ALREADY HAS RX FOR MYCOLOG CREAM.  PT TO CALL BACK IF NO RELIEF OR ANY CONCERNS.

## 2012-03-21 NOTE — Telephone Encounter (Signed)
Triage/epic 

## 2012-03-22 LAB — CULTURE, BETA STREP (GROUP B ONLY)

## 2012-03-26 ENCOUNTER — Ambulatory Visit (INDEPENDENT_AMBULATORY_CARE_PROVIDER_SITE_OTHER): Payer: Medicaid Other | Admitting: Obstetrics and Gynecology

## 2012-03-26 ENCOUNTER — Telehealth: Payer: Self-pay | Admitting: Obstetrics and Gynecology

## 2012-03-26 ENCOUNTER — Ambulatory Visit (INDEPENDENT_AMBULATORY_CARE_PROVIDER_SITE_OTHER): Payer: Medicaid Other

## 2012-03-26 ENCOUNTER — Encounter: Payer: Medicaid Other | Admitting: Obstetrics and Gynecology

## 2012-03-26 ENCOUNTER — Inpatient Hospital Stay (HOSPITAL_COMMUNITY)
Admission: AD | Admit: 2012-03-26 | Discharge: 2012-03-26 | Disposition: A | Discharge status: Home / Self Care | Payer: Medicaid Other | Source: Ambulatory Visit | Attending: Obstetrics and Gynecology | Admitting: Obstetrics and Gynecology

## 2012-03-26 ENCOUNTER — Other Ambulatory Visit: Payer: Self-pay | Admitting: Obstetrics and Gynecology

## 2012-03-26 ENCOUNTER — Encounter (HOSPITAL_COMMUNITY): Payer: Self-pay

## 2012-03-26 DIAGNOSIS — O368190 Decreased fetal movements, unspecified trimester, not applicable or unspecified: Secondary | ICD-10-CM

## 2012-03-26 DIAGNOSIS — O26849 Uterine size-date discrepancy, unspecified trimester: Secondary | ICD-10-CM

## 2012-03-26 DIAGNOSIS — O4690 Antepartum hemorrhage, unspecified, unspecified trimester: Secondary | ICD-10-CM

## 2012-03-26 DIAGNOSIS — Z331 Pregnant state, incidental: Secondary | ICD-10-CM

## 2012-03-26 DIAGNOSIS — Z283 Underimmunization status: Secondary | ICD-10-CM

## 2012-03-26 DIAGNOSIS — Z3490 Encounter for supervision of normal pregnancy, unspecified, unspecified trimester: Secondary | ICD-10-CM

## 2012-03-26 LAB — WET PREP, GENITAL
Trich, Wet Prep: NONE SEEN
Yeast Wet Prep HPF POC: NONE SEEN

## 2012-03-26 NOTE — Discharge Instructions (Signed)
Fetal Movement Counts Patient Name: __________________________________________________ Patient Due Date: ____________________ Kick counts is highly recommended in high risk pregnancies, but it is a good idea for every pregnant woman to do. Start counting fetal movements at 28 weeks of the pregnancy. Fetal movements increase after eating a full meal or eating or drinking something sweet (the blood sugar is higher). It is also important to drink plenty of fluids (well hydrated) before doing the count. Lie on your left side because it helps with the circulation or you can sit in a comfortable chair with your arms over your belly (abdomen) with no distractions around you. DOING THE COUNT  Try to do the count the same time of day each time you do it.   Mark the day and time, then see how long it takes for you to feel 10 movements (kicks, flutters, swishes, rolls). You should have at least 10 movements within 2 hours. You will most likely feel 10 movements in much less than 2 hours. If you do not, wait an hour and count again. After a couple of days you will see a pattern.   What you are looking for is a change in the pattern or not enough counts in 2 hours. Is it taking longer in time to reach 10 movements?  SEEK MEDICAL CARE IF:  You feel less than 10 counts in 2 hours. Tried twice.   No movement in one hour.   The pattern is changing or taking longer each day to reach 10 counts in 2 hours.   You feel the baby is not moving as it usually does.  Date: ____________ Movements: ____________ Start time: ____________ Finish time: ____________  Date: ____________ Movements: ____________ Start time: ____________ Finish time: ____________ Date: ____________ Movements: ____________ Start time: ____________ Finish time: ____________ Date: ____________ Movements: ____________ Start time: ____________ Finish time: ____________ Date: ____________ Movements: ____________ Start time: ____________ Finish time:  ____________ Date: ____________ Movements: ____________ Start time: ____________ Finish time: ____________ Date: ____________ Movements: ____________ Start time: ____________ Finish time: ____________ Date: ____________ Movements: ____________ Start time: ____________ Finish time: ____________  Date: ____________ Movements: ____________ Start time: ____________ Finish time: ____________ Date: ____________ Movements: ____________ Start time: ____________ Finish time: ____________ Date: ____________ Movements: ____________ Start time: ____________ Finish time: ____________ Date: ____________ Movements: ____________ Start time: ____________ Finish time: ____________ Date: ____________ Movements: ____________ Start time: ____________ Finish time: ____________ Date: ____________ Movements: ____________ Start time: ____________ Finish time: ____________ Date: ____________ Movements: ____________ Start time: ____________ Finish time: ____________  Date: ____________ Movements: ____________ Start time: ____________ Finish time: ____________ Date: ____________ Movements: ____________ Start time: ____________ Finish time: ____________ Date: ____________ Movements: ____________ Start time: ____________ Finish time: ____________ Date: ____________ Movements: ____________ Start time: ____________ Finish time: ____________ Date: ____________ Movements: ____________ Start time: ____________ Finish time: ____________ Date: ____________ Movements: ____________ Start time: ____________ Finish time: ____________ Date: ____________ Movements: ____________ Start time: ____________ Finish time: ____________  Date: ____________ Movements: ____________ Start time: ____________ Finish time: ____________ Date: ____________ Movements: ____________ Start time: ____________ Finish time: ____________ Date: ____________ Movements: ____________ Start time: ____________ Finish time: ____________ Date: ____________ Movements:  ____________ Start time: ____________ Finish time: ____________ Date: ____________ Movements: ____________ Start time: ____________ Finish time: ____________ Date: ____________ Movements: ____________ Start time: ____________ Finish time: ____________ Date: ____________ Movements: ____________ Start time: ____________ Finish time: ____________  Date: ____________ Movements: ____________ Start time: ____________ Finish time: ____________ Date: ____________ Movements: ____________ Start time: ____________ Finish time: ____________ Date: ____________ Movements: ____________ Start time:   ____________ Finish time: ____________ Date: ____________ Movements: ____________ Start time: ____________ Finish time: ____________ Date: ____________ Movements: ____________ Start time: ____________ Finish time: ____________ Date: ____________ Movements: ____________ Start time: ____________ Finish time: ____________ Date: ____________ Movements: ____________ Start time: ____________ Finish time: ____________  Date: ____________ Movements: ____________ Start time: ____________ Finish time: ____________ Date: ____________ Movements: ____________ Start time: ____________ Finish time: ____________ Date: ____________ Movements: ____________ Start time: ____________ Finish time: ____________ Date: ____________ Movements: ____________ Start time: ____________ Finish time: ____________ Date: ____________ Movements: ____________ Start time: ____________ Finish time: ____________ Date: ____________ Movements: ____________ Start time: ____________ Finish time: ____________ Date: ____________ Movements: ____________ Start time: ____________ Finish time: ____________  Date: ____________ Movements: ____________ Start time: ____________ Finish time: ____________ Date: ____________ Movements: ____________ Start time: ____________ Finish time: ____________ Date: ____________ Movements: ____________ Start time: ____________ Finish  time: ____________ Date: ____________ Movements: ____________ Start time: ____________ Finish time: ____________ Date: ____________ Movements: ____________ Start time: ____________ Finish time: ____________ Date: ____________ Movements: ____________ Start time: ____________ Finish time: ____________ Date: ____________ Movements: ____________ Start time: ____________ Finish time: ____________  Date: ____________ Movements: ____________ Start time: ____________ Finish time: ____________ Date: ____________ Movements: ____________ Start time: ____________ Finish time: ____________ Date: ____________ Movements: ____________ Start time: ____________ Finish time: ____________ Date: ____________ Movements: ____________ Start time: ____________ Finish time: ____________ Date: ____________ Movements: ____________ Start time: ____________ Finish time: ____________ Date: ____________ Movements: ____________ Start time: ____________ Finish time: ____________ Document Released: 10/25/2006 Document Revised: 09/14/2011 Document Reviewed: 04/27/2009 ExitCare Patient Information 2012 [employer].Normal Labor and Delivery Your caregiver must first be sure you are in labor. Signs of labor include:  You may pass what is called "the mucus plug" before labor begins. This is a small amount of blood stained mucus.   Regular uterine contractions.   The time between contractions get closer together.   The discomfort and pain gradually gets more intense.   Pains are mostly located in the back.   Pains get worse when walking.   The cervix (the opening of the uterus becomes thinner (begins to efface) and opens up (dilates).  Once you are in labor and admitted into the hospital or care center, your caregiver will do the following:  A complete physical examination.   Check your vital signs (blood pressure, pulse, temperature and the fetal heart rate).   Do a vaginal examination (using a sterile glove and  lubricant) to determine:   The position (presentation) of the baby (head [vertex] or buttock first).   The level (station) of the baby's head in the birth canal.   The effacement and dilatation of the cervix.   You may have your pubic hair shaved and be given an enema depending on your caregiver and the circumstance.   An electronic monitor is usually placed on your abdomen. The monitor follows the length and intensity of the contractions, as well as the baby's heart rate.   Usually, your caregiver will insert an IV in your arm with a bottle of sugar water. This is done as a precaution so that medications can be given to you quickly during labor or delivery.  NORMAL LABOR AND DELIVERY IS DIVIDED UP INTO 3 STAGES: First Stage This is when regular contractions begin and the cervix begins to efface and dilate. This stage can last from 3 to 15 hours. The end of the first stage is when the cervix is 100% effaced and 10 centimeters dilated. Pain medications may be given by   Injection (morphine, demerol,   etc.)   Regional anesthesia (spinal, caudal or epidural, anesthetics given in different locations of the spine). Paracervical pain medication may be given, which is an injection of and anesthetic on each side of the cervix.  A pregnant woman may request to have "Natural Childbirth" which is not to have any medications or anesthesia during her labor and delivery. Second Stage This is when the baby comes down through the birth canal (vagina) and is born. This can take 1 to 4 hours. As the baby's head comes down through the birth canal, you may feel like you are going to have a bowel movement. You will get the urge to bear down and push until the baby is delivered. As the baby's head is being delivered, the caregiver will decide if an episiotomy (a cut in the perineum and vagina area) is needed to prevent tearing of the tissue in this area. The episiotomy is sewn up after the delivery of the baby and  placenta. Sometimes a mask with nitrous oxide is given for the mother to breath during the delivery of the baby to help if there is too much pain. The end of Stage 2 is when the baby is fully delivered. Then when the umbilical cord stops pulsating it is clamped and cut. Third Stage The third stage begins after the baby is completely delivered and ends after the placenta (afterbirth) is delivered. This usually takes 5 to 30 minutes. After the placenta is delivered, a medication is given either by intravenous or injection to help contract the uterus and prevent bleeding. The third stage is not painful and pain medication is usually not necessary. If an episiotomy was done, it is repaired at this time. After the delivery, the mother is watched and monitored closely for 1 to 2 hours to make sure there is no postpartum bleeding (hemorrhage). If there is a lot of bleeding, medication is given to contract the uterus and stop the bleeding. Document Released: 07/04/2008 Document Revised: 09/14/2011 Document Reviewed: 07/04/2008 ExitCare Patient Information 2012 [employer].  

## 2012-03-26 NOTE — Telephone Encounter (Signed)
Pt called and states that she woke up this morning with heavy bleeding. No fetal movement. Advised pt to go to [facility] hospital.

## 2012-03-26 NOTE — MAU Provider Note (Signed)
History     CSN: [id number]  Arrival date and time: 03/26/12 1000   First Provider Initiated Contact with Patient 03/26/12 1035      Chief Complaint  Patient presents with  . Vaginal Bleeding  . Decreased Fetal Movement   HPI Comments: Pt is a G1P0 at [redacted]w[redacted]d that arrives after calling office this morning c/o vaginal bleeding. She denies any ctx, LOF, also c/o dec FM earlier today, reports baby active since being here. She denies any d/c. She does report having IC last night.  Pregnancy significant for:  1. Late PNC 2. S<D, poor wgt gain, [place] from 5-28, EFW 26% 3. Asthma 4. Rubella NI 5. irreg menses/?LMP       Past Medical History  Diagnosis Date  . Asthma     Past Surgical History  Procedure Date  . No past surgeries     Family History  Problem Relation Age of Onset  . Anesthesia problems Neg Hx   . Von Willebrand disease Sister   . Hypertension Maternal Grandmother   . Cancer Paternal Grandmother     unknown origin  . Cancer Paternal Grandfather     unknown origin    History  Substance Use Topics  . Smoking status: Never Smoker   . Smokeless tobacco: Never Used  . Alcohol Use: No    Allergies: No Known Allergies  Prescriptions prior to admission  Medication Sig Dispense Refill  . budesonide-formoterol (SYMBICORT) 160-4.5 MCG/ACT inhaler Inhale 2 puffs into the lungs 2 (two) times daily.        . Nutritional Supplements (ENSURE NUTRA SHAKE HI-CAL) LIQD Take 1 Can by mouth daily.  30 Can  3  . nystatin-triamcinolone ointment (MYCOLOG) Apply 1 application topically 3 (three) times daily as needed. For itch from infection      . Prenatal Vit-Fe Fumarate-FA (PRENATAL MULTIVITAMIN) TABS Take 1 tablet by mouth daily.      . ranitidine (ZANTAC) 150 MG tablet Take 1 tablet (150 mg total) by mouth 2 (two) times daily.  60 tablet  3  . terconazole (TERAZOL 3) 80 MG vaginal suppository Place 1 suppository (80 mg total) vaginally at bedtime.  3 suppository  0     Review of Systems  Genitourinary:       Lg amt dark red blood this AM, spotting when wiping now   All other systems reviewed and are negative.   Physical Exam   Blood pressure 110/71, pulse 87, temperature 97.8 F (36.6 C), temperature source Oral, resp. rate 16, height 5' 2.5" (1.588 m), weight 124 lb 3.2 oz (56.337 kg).  Physical Exam  Nursing note and vitals reviewed. Constitutional: She is oriented to person, place, and time. She appears well-developed and well-nourished. No distress.  HENT:  Head: Normocephalic.  Neck: Normal range of motion.  Cardiovascular: Normal rate, regular rhythm and normal heart sounds.   Respiratory: Effort normal and breath sounds normal.  GI: Soft. Bowel sounds are normal.       Gravid NT FH=34cm - vertex is low   Genitourinary: Vagina normal.       Scant amt BRB in vault, cx friable  Wet prep - neg GC/CT collected VE=FT/50/0 - cx posterior, scant bloody show on glove  Musculoskeletal: Normal range of motion. She exhibits no edema.  Neurological: She is alert and oriented to person, place, and time.  Skin: Skin is warm and dry.  Psychiatric: She has a normal mood and affect. Her behavior is normal.   FHR 130  reactive, cat 1 toco irreg 4-7  MAU Course  Procedures  Wet prep GC/CT   Assessment and Plan  IUP at [redacted]w[redacted]d  S<D Vag bleeding - likely post-coital bleeding FHR reassuring GBS neg Wet prep neg GC/CT - pending  Pt worked in at [employer] office for [place] at noon for f/u growth and to check placenta and AFI, and BPP Pt then has f/u w Dr Su Hilt at 1pm D/C'd from MAU w instructions to go to office, rv'd labor sx's and FKC   Marie Holt M 03/26/2012, 11:03 AM

## 2012-03-26 NOTE — Telephone Encounter (Signed)
TC from [facility].  Pt is being D/C'd from MAU at this time.   Sched for U/S for growth, bleeding, BPP and AFI today and to keep ROB appt with DR AR.

## 2012-03-26 NOTE — MAU Note (Signed)
Patient is in with c/o light vaginal bleeding that she noticed while wiping this morning. She also reports decreased fetal movement. Denies pain, feels mild pelvic pressure. She denies lof.

## 2012-03-26 NOTE — Progress Notes (Signed)
Pt did not return to be seen. U/S 6lbs 4oz, 19.9%, nl fluid (AFI 14cm), vtx, BPP 8/8, post placenta

## 2012-03-27 LAB — GC/CHLAMYDIA PROBE AMP, GENITAL: GC Probe Amp, Genital: NEGATIVE

## 2012-03-31 ENCOUNTER — Inpatient Hospital Stay (HOSPITAL_COMMUNITY): Payer: Medicaid Other | Admitting: Anesthesiology

## 2012-03-31 ENCOUNTER — Inpatient Hospital Stay (HOSPITAL_COMMUNITY)
Admission: AD | Admit: 2012-03-31 | Discharge: 2012-04-03 | DRG: 775 | Disposition: A | Discharge status: Home / Self Care | Payer: Medicaid Other | Source: Ambulatory Visit | Attending: Obstetrics and Gynecology | Admitting: Obstetrics and Gynecology

## 2012-03-31 ENCOUNTER — Encounter (HOSPITAL_COMMUNITY): Payer: Self-pay | Admitting: *Deleted

## 2012-03-31 ENCOUNTER — Encounter (HOSPITAL_COMMUNITY): Payer: Self-pay | Admitting: Anesthesiology

## 2012-03-31 DIAGNOSIS — Z283 Underimmunization status: Secondary | ICD-10-CM

## 2012-03-31 DIAGNOSIS — O0930 Supervision of pregnancy with insufficient antenatal care, unspecified trimester: Secondary | ICD-10-CM

## 2012-03-31 DIAGNOSIS — O365990 Maternal care for other known or suspected poor fetal growth, unspecified trimester, not applicable or unspecified: Secondary | ICD-10-CM | POA: Diagnosis present

## 2012-03-31 DIAGNOSIS — O2610 Low weight gain in pregnancy, unspecified trimester: Secondary | ICD-10-CM | POA: Diagnosis present

## 2012-03-31 LAB — CBC
HCT: 37 % (ref 36.0–46.0)
Platelets: 201 10*3/uL (ref 150–400)
RDW: 13.2 % (ref 11.5–15.5)
WBC: 11.5 10*3/uL — ABNORMAL HIGH (ref 4.0–10.5)

## 2012-03-31 MED ORDER — LIDOCAINE HCL (PF) 1 % IJ SOLN
30.0000 mL | INTRAMUSCULAR | Status: DC | PRN
  Administered 2012-04-01: 30 mL via SUBCUTANEOUS
  Filled 2012-03-31: qty 30

## 2012-03-31 MED ORDER — LACTATED RINGERS IV SOLN
INTRAVENOUS | Status: DC
  Administered 2012-03-31 (×2): via INTRAVENOUS

## 2012-03-31 MED ORDER — ONDANSETRON HCL 4 MG/2ML IJ SOLN: 4.0000 mg | Freq: Four times a day (QID) | INTRAMUSCULAR | Status: DC | PRN

## 2012-03-31 MED ORDER — OXYTOCIN BOLUS FROM INFUSION
250.0000 mL | Freq: Once | INTRAVENOUS | Status: DC
  Filled 2012-03-31: qty 500

## 2012-03-31 MED ORDER — DIPHENHYDRAMINE HCL 50 MG/ML IJ SOLN: 12.5000 mg | INTRAMUSCULAR | Status: DC | PRN

## 2012-03-31 MED ORDER — IBUPROFEN 600 MG PO TABS: 600.0000 mg | ORAL_TABLET | Freq: Four times a day (QID) | ORAL | Status: DC | PRN

## 2012-03-31 MED ORDER — EPHEDRINE 5 MG/ML INJ
10.0000 mg | INTRAVENOUS | Status: DC | PRN
  Filled 2012-03-31: qty 4

## 2012-03-31 MED ORDER — OXYTOCIN 40 UNITS IN LACTATED RINGERS INFUSION - SIMPLE MED
62.5000 mL/h | Freq: Once | INTRAVENOUS | Status: AC
  Administered 2012-04-01: 5 [IU]/h via INTRAVENOUS
  Administered 2012-04-01: 2.5 [IU]/h via INTRAVENOUS
  Filled 2012-03-31: qty 1000

## 2012-03-31 MED ORDER — OXYTOCIN 10 UNIT/ML IJ SOLN: 10.0000 [IU] | Freq: Once | INTRAMUSCULAR | Status: DC

## 2012-03-31 MED ORDER — CITRIC ACID-SODIUM CITRATE 334-500 MG/5ML PO SOLN
30.0000 mL | ORAL | Status: DC | PRN
  Filled 2012-03-31: qty 15

## 2012-03-31 MED ORDER — PHENYLEPHRINE 40 MCG/ML (10ML) SYRINGE FOR IV PUSH (FOR BLOOD PRESSURE SUPPORT): 80.0000 ug | PREFILLED_SYRINGE | INTRAVENOUS | Status: DC | PRN

## 2012-03-31 MED ORDER — EPHEDRINE 5 MG/ML INJ: 10.0000 mg | INTRAVENOUS | Status: DC | PRN

## 2012-03-31 MED ORDER — LACTATED RINGERS IV SOLN: 500.0000 mL | INTRAVENOUS | Status: DC | PRN

## 2012-03-31 MED ORDER — FENTANYL 2.5 MCG/ML BUPIVACAINE 1/10 % EPIDURAL INFUSION (WH - ANES)
INTRAMUSCULAR | Status: DC | PRN
  Administered 2012-03-31: 14 mL/h via EPIDURAL

## 2012-03-31 MED ORDER — OXYCODONE-ACETAMINOPHEN 5-325 MG PO TABS: 1.0000 | ORAL_TABLET | ORAL | Status: DC | PRN

## 2012-03-31 MED ORDER — FENTANYL 2.5 MCG/ML BUPIVACAINE 1/10 % EPIDURAL INFUSION (WH - ANES)
14.0000 mL/h | INTRAMUSCULAR | Status: DC
  Administered 2012-04-01: 14 mL/h via EPIDURAL
  Filled 2012-03-31 (×2): qty 60

## 2012-03-31 MED ORDER — SODIUM BICARBONATE 8.4 % IV SOLN
INTRAVENOUS | Status: DC | PRN
  Administered 2012-03-31: 4 mL via EPIDURAL

## 2012-03-31 MED ORDER — PHENYLEPHRINE 40 MCG/ML (10ML) SYRINGE FOR IV PUSH (FOR BLOOD PRESSURE SUPPORT)
80.0000 ug | PREFILLED_SYRINGE | INTRAVENOUS | Status: DC | PRN
  Filled 2012-03-31: qty 5

## 2012-03-31 MED ORDER — FENTANYL CITRATE 0.05 MG/ML IJ SOLN: 100.0000 ug | INTRAMUSCULAR | Status: DC | PRN

## 2012-03-31 MED ORDER — ACETAMINOPHEN 325 MG PO TABS: 650.0000 mg | ORAL_TABLET | ORAL | Status: DC | PRN

## 2012-03-31 MED ORDER — LACTATED RINGERS IV SOLN
500.0000 mL | Freq: Once | INTRAVENOUS | Status: AC
  Administered 2012-04-01: 500 mL via INTRAVENOUS

## 2012-03-31 NOTE — Anesthesia Procedure Notes (Signed)
Epidural Patient location during procedure: OB  Preanesthetic Checklist Completed: patient identified, site marked, surgical consent, pre-op evaluation, timeout performed, IV checked, risks and benefits discussed and monitors and equipment checked  Epidural Patient position: sitting Prep: site prepped and draped and DuraPrep Patient monitoring: continuous pulse ox and blood pressure Approach: midline Injection technique: LOR air  Needle:  Needle type: Tuohy  Needle gauge: 17 G Needle length: 9 cm Needle insertion depth: 4 cm Catheter type: closed end flexible Catheter size: 19 Gauge Catheter at skin depth: 9 cm Test dose: negative  Assessment Events: blood not aspirated, injection not painful, no injection resistance, negative IV test and no paresthesia  Additional Notes Dosing of Epidural:  1st dose, through needle ............................................. epi 1:200K + Xylocaine 40 mg  2nd dose, through catheter, after waiting 3 minutes.....epi 1:200K + Xylocaine 40 mg  3rd dose, through catheter after waiting 3 minutes .............................Marcaine   4mg   ( mg Marcaine are expressed as equivilent  cc's medication removed from the 0.1%Bupiv / fentanyl syringe from L&D pump)  ( 2% Xylo charted as a single dose in Epic Meds for ease of charting; actual dosing was fractionated as above, for saftey's sake)  As each dose occurred, patient was free of IV sx; and patient exhibited no evidence of SA injection.  Patient is more comfortable after epidural dosed. Please see RN's note for documentation of vital signs,and FHR which are stable.  Patient reminded not to try to ambulate with numb legs, and that an RN must be present the 1st time she attempts to get up.    

## 2012-03-31 NOTE — MAU Note (Signed)
Contractions every 5-15 minutes x2 hours. Denies leaking of fluid. Red/brown spotting x2 days. Has only felt fetal movement twice today.

## 2012-03-31 NOTE — Anesthesia Preprocedure Evaluation (Addendum)
Anesthesia Evaluation  Patient identified by MRN, date of birth, ID band Patient awake    Reviewed: Allergy & Precautions, H&P , Patient's Chart, lab work & pertinent test results  Airway Mallampati: II  TM Distance: >3 FB Neck ROM: full    Dental  (+) Teeth Intact   Pulmonary asthma ,  breath sounds clear to auscultation        Cardiovascular Rhythm:regular Rate:Normal     Neuro/Psych    GI/Hepatic GERD-  ,  Endo/Other    Renal/GU      Musculoskeletal   Abdominal   Peds  Hematology   Anesthesia Other Findings       Reproductive/Obstetrics (+) Pregnancy                             Anesthesia Physical Anesthesia Plan  ASA: II  Anesthesia Plan: Epidural   Post-op Pain Management:    Induction:   Airway Management Planned:   Additional Equipment:   Intra-op Plan:   Post-operative Plan:   Informed Consent: I have reviewed the patients History and Physical, chart, labs and discussed the procedure including the risks, benefits and alternatives for the proposed anesthesia with the patient or authorized representative who has indicated his/her understanding and acceptance.   Dental Advisory Given  Plan Discussed with:   Anesthesia Plan Comments: (Labs checked- platelets confirmed with RN in room. Fetal heart tracing, per RN, reported to be stable enough for sitting procedure. Discussed epidural, and patient consents to the procedure:  included risk of possible headache,backache, failed block, allergic reaction, and nerve injury. This patient was asked if she had any questions or concerns before the procedure started.)        Anesthesia Quick Evaluation  

## 2012-03-31 NOTE — MAU Note (Signed)
Pt c/o bloody show for the past two days.  Had intercourse 2 days ago, states contractions started 3 hrs ago and are getting closer together.  Denies any leaking of fluid.

## 2012-03-31 NOTE — H&P (Signed)
Marie Holt is a 21 y.o. female presenting for ctx, states she's been having them since last night, but stronger and closer this evening. Reports some brown/bloody show, no LOF, +FM, tho states she's not noticed as much FM today.  Pregnancy significant for: 1. LTC 2. Rubella equivocal 3. Poor wgt gain 4. Asthma 5. irreg menses 6. Poor dentition 7. Fibrocystic breast  HPI: Pt began [facility] at [facility] at 14wks, tho she did have [place] at [facility] at 6wk that confirmed [facility] 04/09/12. Pt was treated for YI and had normal anatomy scan at 18wks. She continued to have routine prenatal care. [place] on 6-18 for size<dates was normal w growth at 19%, norm AFI and BPP 8/8.   Maternal Medical History:  Reason for admission: Reason for admission: contractions.  Contractions: Onset was 3-5 hours ago.   Frequency: regular.   Duration is approximately 60 seconds.   Perceived severity is moderate.    Fetal activity: Perceived fetal activity is normal.   Last perceived fetal movement was within the past hour.    Prenatal complications: no prenatal complications Prenatal Complications - Diabetes: none.    OB History    Grav Para Term Preterm Abortions TAB SAB Ect Mult Living   1              Past Medical History  Diagnosis Date  . Asthma    Past Surgical History  Procedure Date  . No past surgeries    Family History: family history includes Cancer in her paternal grandfather and paternal grandmother; Hypertension in her maternal grandmother; and Von Willebrand disease in her sister.  There is no history of Anesthesia problems. Social History:  reports that she has never smoked. She has never used smokeless tobacco. She reports that she uses illicit drugs (Marijuana). She reports that she does not drink alcohol.   Prenatal Transfer Tool  Maternal Diabetes: No Genetic Screening: Declined Maternal Ultrasounds/Referrals: Normal Fetal Ultrasounds or other Referrals:  None Maternal Substance Abuse:   No Significant Maternal Medications:  None Significant Maternal Lab Results:  None Other Comments:  None  Review of Systems  All other systems reviewed and are negative.    Dilation: 4.5 Effacement (%): 90 Station: -1 Exam by:: Elie Confer RN Blood pressure 113/75, pulse 68, temperature 98.3 F (36.8 C), temperature source Oral, resp. rate 18, height 5\' 2"  (1.575 m), weight 124 lb 12.8 oz (56.609 kg), SpO2 100.00%. Maternal Exam:  Uterine Assessment: Contraction strength is moderate.  Contraction duration is 60 seconds. Contraction frequency is regular.   Abdomen: Patient reports no abdominal tenderness. Fundal height is S<D.   Estimated fetal weight is 6+.   Fetal presentation: vertex  Introitus: Normal vulva. Normal vagina.  Vagina is negative for discharge.  Pelvis: adequate for delivery.   Cervix: Cervix evaluated by digital exam.     Fetal Exam Fetal Monitor Review: Mode: ultrasound.   Baseline rate: 130.  Variability: moderate (6-25 bpm).   Pattern: accelerations present and no decelerations.    Fetal State Assessment: Category I - tracings are normal.     Physical Exam  Nursing note and vitals reviewed. Constitutional: She is oriented to person, place, and time. She appears well-developed and well-nourished. She appears distressed.  HENT:  Head: Normocephalic and atraumatic.  Eyes: Pupils are equal, round, and reactive to light.  Neck: Normal range of motion.  Cardiovascular: Normal rate, regular rhythm and normal heart sounds.   Respiratory: Effort normal and breath sounds normal.  GI: Soft.  Bowel sounds are normal.       Gravid, NT  Genitourinary: Vagina normal. No vaginal discharge found.  Musculoskeletal: Normal range of motion. She exhibits no edema.  Neurological: She is alert and oriented to person, place, and time. She has normal reflexes.  Skin: Skin is warm and dry.  Psychiatric: She has a normal mood and affect. Her behavior is normal.     Prenatal labs: ABO, Rh: --/--/O POS (10/30 2140) Antibody:  neg Rubella:  equivocal  RPR: NON REAC (05/13 1654)  HBsAg:   neg HIV:   neg GBS:   neg GC/CT neg  Assessment/Plan: IUP at [redacted]w[redacted]d Active labor GBS neg FHR reassuring  Admit to b.s. Dr Pennie Rushing attending CNM care Routine L&D orders Analgesia/anethesia per pt request    Bellany Elbaum M 03/31/2012, 9:10 PM

## 2012-03-31 NOTE — Progress Notes (Signed)
Patient ID: Marie Holt, female   DOB: 12-Dec-1990, 21 y.o.   MRN: [id number] .Subjective:  Comfortable now w epidural  Objective: BP 109/61  Pulse 65  Temp 97.3 F (36.3 C) (Oral)  Resp 20  Ht 5\' 2"  (1.575 m)  Wt 124 lb 12.8 oz (56.609 kg)  BMI 22.83 kg/m2  SpO2 100%   FHT:  FHR: 130 bpm, variability: moderate,  accelerations:  Present,  decelerations:  Absent UC:   regular, every 2-3 minutes SVE:   Dilation: 4.5 Effacement (%): 90 Station: -1 Exam by:: Marie Confer RN  VE per RN same  Assessment / Plan: Spontaneous labor, progressing normally GBS neg Will recheck cervix in 1-2 hr consider AROM/augment PRN   Fetal Wellbeing:  Category I Pain Control:  Epidural  Update physician PRN  Marie Holt 03/31/2012, 10:51 PM

## 2012-04-01 ENCOUNTER — Encounter (HOSPITAL_COMMUNITY): Payer: Self-pay | Admitting: *Deleted

## 2012-04-01 LAB — CBC
HCT: 33.7 % — ABNORMAL LOW (ref 36.0–46.0)
Hemoglobin: 11.1 g/dL — ABNORMAL LOW (ref 12.0–15.0)
MCH: 30.5 pg (ref 26.0–34.0)
MCV: 92.6 fL (ref 78.0–100.0)
RBC: 3.64 MIL/uL — ABNORMAL LOW (ref 3.87–5.11)

## 2012-04-01 LAB — RPR: RPR Ser Ql: NONREACTIVE

## 2012-04-01 MED ORDER — ONDANSETRON HCL 4 MG PO TABS: 4.0000 mg | ORAL_TABLET | ORAL | Status: DC | PRN

## 2012-04-01 MED ORDER — IBUPROFEN 600 MG PO TABS
600.0000 mg | ORAL_TABLET | Freq: Four times a day (QID) | ORAL | Status: DC
  Administered 2012-04-01 – 2012-04-03 (×9): 600 mg via ORAL
  Filled 2012-04-01 (×9): qty 1

## 2012-04-01 MED ORDER — KETOROLAC TROMETHAMINE 30 MG/ML IJ SOLN
30.0000 mg | Freq: Once | INTRAMUSCULAR | Status: AC
  Administered 2012-04-01: 30 mg via INTRAVENOUS
  Filled 2012-04-01: qty 1

## 2012-04-01 MED ORDER — LANOLIN HYDROUS EX OINT: TOPICAL_OINTMENT | CUTANEOUS | Status: DC | PRN

## 2012-04-01 MED ORDER — MISOPROSTOL 200 MCG PO TABS
ORAL_TABLET | ORAL | Status: AC
  Administered 2012-04-01: 8000 ug
  Filled 2012-04-01: qty 4

## 2012-04-01 MED ORDER — DIPHENHYDRAMINE HCL 25 MG PO CAPS: 25.0000 mg | ORAL_CAPSULE | Freq: Four times a day (QID) | ORAL | Status: DC | PRN

## 2012-04-01 MED ORDER — SIMETHICONE 80 MG PO CHEW: 80.0000 mg | CHEWABLE_TABLET | ORAL | Status: DC | PRN

## 2012-04-01 MED ORDER — DIBUCAINE 1 % RE OINT: 1.0000 "application " | TOPICAL_OINTMENT | RECTAL | Status: DC | PRN

## 2012-04-01 MED ORDER — ZOLPIDEM TARTRATE 5 MG PO TABS: 5.0000 mg | ORAL_TABLET | Freq: Every evening | ORAL | Status: DC | PRN

## 2012-04-01 MED ORDER — SENNOSIDES-DOCUSATE SODIUM 8.6-50 MG PO TABS
2.0000 | ORAL_TABLET | Freq: Every day | ORAL | Status: DC
  Administered 2012-04-01 – 2012-04-02 (×2): 2 via ORAL

## 2012-04-01 MED ORDER — TERBUTALINE SULFATE 1 MG/ML IJ SOLN
INTRAMUSCULAR | Status: AC
  Administered 2012-04-01: 0.25 mg
  Filled 2012-04-01: qty 1

## 2012-04-01 MED ORDER — TETANUS-DIPHTH-ACELL PERTUSSIS 5-2.5-18.5 LF-MCG/0.5 IM SUSP
0.5000 mL | Freq: Once | INTRAMUSCULAR | Status: AC
  Administered 2012-04-02: 0.5 mL via INTRAMUSCULAR
  Filled 2012-04-01: qty 0.5

## 2012-04-01 MED ORDER — WITCH HAZEL-GLYCERIN EX PADS: 1.0000 "application " | MEDICATED_PAD | CUTANEOUS | Status: DC | PRN

## 2012-04-01 MED ORDER — BENZOCAINE-MENTHOL 20-0.5 % EX AERO
1.0000 "application " | INHALATION_SPRAY | CUTANEOUS | Status: DC | PRN
  Administered 2012-04-01: 1 via TOPICAL
  Filled 2012-04-01: qty 56

## 2012-04-01 MED ORDER — PRENATAL MULTIVITAMIN CH
1.0000 | ORAL_TABLET | Freq: Every day | ORAL | Status: DC
  Administered 2012-04-01 – 2012-04-03 (×4): 1 via ORAL
  Filled 2012-04-01 (×3): qty 1

## 2012-04-01 MED ORDER — MEASLES, MUMPS & RUBELLA VAC ~~LOC~~ INJ
0.5000 mL | INJECTION | Freq: Once | SUBCUTANEOUS | Status: AC
  Administered 2012-04-03: 0.5 mL via SUBCUTANEOUS
  Filled 2012-04-01 (×2): qty 0.5

## 2012-04-01 MED ORDER — OXYCODONE-ACETAMINOPHEN 5-325 MG PO TABS
1.0000 | ORAL_TABLET | ORAL | Status: DC | PRN
  Administered 2012-04-01 – 2012-04-03 (×4): 1 via ORAL
  Filled 2012-04-01 (×4): qty 1

## 2012-04-01 MED ORDER — ONDANSETRON HCL 4 MG/2ML IJ SOLN
4.0000 mg | INTRAMUSCULAR | Status: DC | PRN
  Administered 2012-04-01: 4 mg via INTRAVENOUS
  Filled 2012-04-01: qty 2

## 2012-04-01 NOTE — Progress Notes (Signed)
Post Partum Day 0--s/p SVB Subjective: Feeling much better.  Nausea resolved, no further vomiting.  Breastfeeding going well.  Objective: Blood pressure 119/77, pulse 96, temperature 100.3 F (37.9 C), temperature source Oral, resp. rate 18, height 5\' 2"  (1.575 m), weight 124 lb 12.8 oz (56.609 kg), SpO2 100.00%, unknown if currently breastfeeding.  Physical Exam:  General: alert Lochia: appropriate Uterine Fundus: firm Incision: healing well DVT Evaluation: No evidence of DVT seen on physical exam. Negative Homan's sign.   Basename 04/01/12 0525 03/31/12 2057  HGB 11.1* 12.5  HCT 33.7* 37.0    Assessment/Plan: Will CTO tolerance of fluids, etc. Routine pp care. Anticipate d/c 04/03/12.   LOS: 1 day   Nigel Bridgeman 04/01/2012, 8:51 AM

## 2012-04-01 NOTE — Progress Notes (Signed)
Palpated abdomen without relaxation between uterine contractions. Almond Lint CNM palpated abdomen also

## 2012-04-01 NOTE — Progress Notes (Signed)
Pt came to unit having N/V, had two episodes of vomiting after arrival, unable to give any pain medication PO due to N/V.  Pt uterus firm and one below with small amount of lochia, small trickle of blood noted on uterine palpation.  Informed Almond Lint, CNM of N/V to get order for pain medication via IV and informed her of small trickle of blood during uterine palpation.  See orders for pain medication, took verbal order to increase flow rate of pitocin from 62.94ml/hr to 124ml/hr until bag complete.

## 2012-04-01 NOTE — Progress Notes (Signed)
SVD of viable female 

## 2012-04-01 NOTE — Progress Notes (Signed)
Patient ID: Marie Holt, female   DOB: [birth year], 21 y.o.   MRN: [id number] .Subjective: Remains comfortable   Objective: BP 105/69  Pulse 86  Temp 97.3 F (36.3 C) (Oral)  Resp 20  Ht 5\' 2"  (1.575 m)  Wt 124 lb 12.8 oz (56.609 kg)  BMI 22.83 kg/m2  SpO2 100%   FHT:  FHR: 130 bpm, variability: moderate,  accelerations:  Present,  decelerations:  Present variables UC:   regular, every 2-3 minutes SVE:   Dilation: 4.5 Effacement (%): 90 Station: -1 Exam by:: [employer] RN Cx=5/100/-1 vtx well applied AROM for mod amt clear fluid, FHR then had variable decels  Assessment / Plan: Spontaneous labor, progressing normally GBS neg Will place amnioinfusion and CTO closely  Fetal Wellbeing:  Category II Pain Control:  Epidural  Update physician PRN  Marie Holt 04/01/2012, 12:13 AM

## 2012-04-01 NOTE — Progress Notes (Signed)
Transferred to room 105 via wheelchair with FOB at bedside.

## 2012-04-01 NOTE — Progress Notes (Signed)
UR chart review completed.  

## 2012-04-01 NOTE — Progress Notes (Signed)
Called to see pt for tachysystole s/p AROM.  CNM at bedside had administered terbutaline, placed O2, started an amnioinfusion.  On my arrival, contractions q90-120 secs and FHR reassurring.  Cx recheck by CNM was 6 cm.   Will continue amnioinfusion and to observe for further cervical progression.BP 105/69  Pulse 86  Temp 97.3 F (36.3 C) (Oral)  Resp 20  Ht 5\' 2"  (1.575 m)  Wt 124 lb 12.8 oz (56.609 kg)  BMI 22.83 kg/m2  SpO2 100%

## 2012-04-01 NOTE — Progress Notes (Signed)
Patient ID: Marie Holt, female   DOB: 1991/04/21, 21 y.o.   MRN: [id number] .Subjective: Remains comfortable   Objective: BP 105/69  Pulse 86  Temp 97.3 F (36.3 C) (Oral)  Resp 20  Ht 5\' 2"  (1.575 m)  Wt 124 lb 12.8 oz (56.609 kg)  BMI 22.83 kg/m2  SpO2 100%   FHT:  FHR: 130 bpm, variability: moderate,  accelerations:  Present,  decelerations:  Present variables, prolonged UC:   Tachysystole, carrying uterine tone SVE:   Dilation: 5 Effacement (%): 100 Station: -1 Exam by:: [employer] RN  terb .25mg  sq given  Assessment / Plan: active labor GBS neg Amnioinfusion in progress   Fetal Wellbeing:  Category II Pain Control:  Epidural At 0030 called dr Pennie Rushing to inform of FHR pattern  Ercilia Bettinger M 04/01/2012, 12:38 AM

## 2012-04-01 NOTE — Progress Notes (Signed)
Patient ID: Marie Holt, female   DOB: [birth year], 21 y.o.   MRN: [id number] .Subjective: Feeling more pressure, otherwise comfortable   Objective: BP 105/69  Pulse 86  Temp 97.3 F (36.3 C) (Oral)  Resp 20  Ht 5\' 2"  (1.575 m)  Wt 124 lb 12.8 oz (56.609 kg)  BMI 22.83 kg/m2  SpO2 100%   FHT:  FHR: 130 bpm, variability: moderate,  accelerations:  Present,  decelerations:  Present variables UC:   regular, every 1-2 minutes SVE:   Dilation: 10 Effacement (%): 100 Station: -1 Exam by:: Narayan Scull CNM  At 0057 Cervix now 8cm, position changes, amnioinfusion continues, w  Assessment / Plan: Spontaneous labor, progressing normally GBS neg  Will cont labor down, await stronger urge for pushing, CTO FHR closely,   Fetal Wellbeing:  Category II Pain Control:  Epidural  Update physician PRN  [name] 04/01/2012, 2:41 AM

## 2012-04-02 MED ORDER — PNEUMOCOCCAL VAC POLYVALENT 25 MCG/0.5ML IJ INJ
0.5000 mL | INJECTION | INTRAMUSCULAR | Status: AC
  Administered 2012-04-02: 0.5 mL via INTRAMUSCULAR
  Filled 2012-04-02: qty 0.5

## 2012-04-02 NOTE — Anesthesia Postprocedure Evaluation (Signed)
  Anesthesia Post-op Note  Patient: Marie Holt  Procedure(s) Performed: * No procedures listed *  Patient Location: Mother/Baby  Anesthesia Type: Epidural  Level of Consciousness: awake  Airway and Oxygen Therapy: Patient Spontanous Breathing  Post-op Pain: none  Post-op Assessment: Post-op Vital signs reviewed  Post-op Vital Signs: Reviewed and stable  Complications: No apparent anesthesia complications

## 2012-04-02 NOTE — Progress Notes (Signed)
Post Partum Day 1 Subjective: no complaints, up ad lib, voiding, tolerating PO, + flatus and pt breastfeeding on my arrival to room; good latch on Rt breast in cradle hold.  No dizziness.  VB a littler heavier than her normal periods.   Objective: Blood pressure 96/59, pulse 75, temperature 97.6 F (36.4 C), temperature source Oral, resp. rate 18, height 5\' 2"  (1.575 m), weight 124 lb 12.8 oz (56.609 kg), SpO2 100.00%, unknown if currently breastfeeding.  Physical Exam:  General: alert, cooperative and no distress Lochia: appropriate, rubra Uterine Fundus: firm, below umbilicus Incision: n/a DVT Evaluation: No evidence of DVT seen on physical exam. Negative Homan's sign. No significant calf/ankle edema.   Basename 04/01/12 0525 03/31/12 2057  HGB 11.1* 12.5  HCT 33.7* 37.0    Assessment/Plan: Plan for discharge tomorrow and Breastfeeding Rubella Nonimmune; offer MMR before d/c.   S/p ML episiotomy.   LOS: 2 days   Marie Holt H 04/02/2012, 10:32 AM

## 2012-04-03 MED ORDER — IBUPROFEN 600 MG PO TABS: 600.0000 mg | ORAL_TABLET | Freq: Four times a day (QID) | ORAL | Status: AC | PRN

## 2012-04-03 NOTE — Discharge Instructions (Signed)
Postpartum Care After Vaginal Delivery  After you deliver your baby, you will stay in the hospital for 24 to 72 hours, unless there were problems with the labor or delivery, or you have medical problems. While you are in the hospital, you will receive help and instructions on how to care for yourself and your baby.  Your doctor will order pain medicine, in case you need it. You will have a small amount of bleeding from your vagina and should change your sanitary pad frequently. Wash your hands thoroughly with soap and water for at least 20 seconds after changing pads and using the toilet. Let the nurses know if you begin to pass blood clots or your bleeding increases. Do not flush blood clots down the toilet before having the nurse look at them, to make sure there is no placental tissue with them.  If you had an intravenous (IV), it will be removed within 24 hours, if there are no problems. The first time you get out of bed or take a shower, call the nurse to help you because you may get weak, lightheaded, or even faint. If you are breastfeeding, you may feel painful contractions of your uterus for a couple of weeks. This is normal. The contractions help your uterus get back to normal size. If you are not breastfeeding, wear a supportive bra and handle your breasts as little as possible until your milk has dried up. Hormones should not be given to dry up the breasts, because they can cause blood clots. You will be given your normal diet, unless you have diabetes or other medical problems.   The nurses may put an ice pack on your episiotomy (surgically enlarged opening), if you have one, to reduce the pain and swelling. On rare occasions, you may not be able to urinate and the nurse will need to empty your bladder with a catheter. If you had a postpartum tubal ligation ("tying tubes," female sterilization), it should not make your stay in the hospital longer.  You may have your baby in your room with you as much as  you like, unless you or the baby has a problem. Use the bassinet (basket) for the baby when going to and from the nursery. Do not carry the baby. Do not leave the postpartum area. If the mother is Rh negative (lacks a protein on the red blood cells) and the baby is Rh positive, the mother should get a Rho-gam shot to prevent Rh problems with future pregnancies.  You may be given written instructions for you and your baby, and necessary medicines, when you are discharged from the hospital. Be sure you understand and follow the instructions as advised.  HOME CARE INSTRUCTIONS   · Follow instructions and take the medicines given to you.   · Only take over-the-counter or prescription medicines for pain, discomfort, or fever as directed by your caregiver.   · Do not take aspirin, because it can cause bleeding.   · Increase your activities a little bit every day to build up your strength and endurance.   · Do not drink alcohol, especially if you are breastfeeding or taking pain medicine.   · Take your temperature twice a day and record it.   · You may have a small amount of bleeding or spotting for 2 to 4 weeks. This is normal.   · Do not use tampons or douche. Use sanitary pads.   · Try to have someone stay and help you for a   the baby is sleeping.   If you are breastfeeding, wear a good support bra. If you are not breastfeeding, wear a supportive bra and do not stimulate your nipples.   Eat a healthy, nutritious diet and continue to take your prenatal vitamins.   Do not drive, do any heavy activities, or travel until your caregiver tells you it is okay.   Do not have intercourse until your caregiver gives you permission to do so.   Ask your caregiver when you can begin to exercise and what type of exercises to do.   Call your caregiver if you think you are having a problem from your delivery.   Call your pediatrician if you are having a problem  with the baby.   Schedule your postpartum visit and keep it.  SEEK MEDICAL CARE IF:   You have a temperature of 100 F (37.8 C) or higher.   You have increased vaginal bleeding or are passing clots. Save any clots to show your caregiver.   You have bloody urine or pain when you urinate.   You have a bad smelling vaginal discharge.   You have increasing pain or swelling on your episiotomy.   You develop a severe headache.   You feel depressed.   The episiotomy is separating.   You become dizzy or lightheaded.   You develop a rash.   You have a reaction or problems with your medicine.   You have pain, redness, or swelling at the intravenous site.  SEEK IMMEDIATE MEDICAL CARE IF:   You have chest pain.   You develop shortness of breath.   You pass out.   You develop pain, with or without swelling or redness in your leg.   You develop heavy vaginal bleeding, with or without blood clots.   You develop stomach pain.   You develop a bad smelling vaginal discharge.  MAKE SURE YOU:   Understand these instructions.   Will watch your condition.   Will get help right away if you are not doing well or get worse.  Document Released: 07/23/2007 Document Revised: 09/14/2011 Document Reviewed: 08/04/2009 [facility] Patient Information 2012 [place], [place]. Episiotomy or Perineal Tear Care After Refer to this sheet in the next few weeks. These instructions provide you with information on caring for yourself after your episiotomy or perineal tear. Your caregiver may also give you more specific instructions. Your treatment has been planned according to current medical practices, but problems sometimes occur. Call your caregiver if you have any problems or questions after your episiotomy or delivery. An episiotomy is a surgical cut (incision) made in the perineum. The perineum is the area of skin between the vaginal opening and the anus.  HOME CARE INSTRUCTIONS   The first day, put  ice on the episiotomy or torn area.   Put ice in a plastic bag.   Place a towel between your skin and the bag.   Leave the ice on for 15 to 20 minutes, 3 to 4 times a day.   Sit in warm sitz baths as directed by your caregiver. This can speed up healing. Make sure the water is not hot enough to cause burns. The use of sitz baths may be continued for several days, or as directed by your caregiver.   Keep the skin dry and clean. Use a towel to lightly clean the area of the episiotomy or tear. Avoid rubbing.   Wash your hands before and after applying medicine to the episiotomy or tear  area.   Put about 3 hemorrhoid pads in your sanitary pad. The witch hazel in the hemorrhoid pads helps with the discomfort and swelling.   Get a peri-bottle to squeeze warm water on the perineum area when urinating. Pat the area to dry it.   Apply a numbing spray to the episiotomy or tear area as directed by your caregiver. This may help with discomfort.   Sitting on an inflatable ring or pillow may provide comfort.   Only take over-the-counter or prescription medicines for pain, discomfort, or fever as directed by your caregiver.   Do not have intercourse or use tampons until your caregiver says it is okay. Typically, you must wait at least 6 weeks.   Keep all postpartum appointments.  SEEK MEDICAL CARE IF:   You need stronger pain medicine.   You have painful urination.  SEEK IMMEDIATE MEDICAL CARE IF:   You have redness, swelling, or increasing pain in the episiotomy or tear area.   You have pus coming from the episiotomy or tear area.   You have a fever.   You notice a bad smell coming from the episiotomy or tear area.   Your episiotomy or tear opens.   You notice swelling that is larger than when you left the hospital in the episiotomy area.   You cannot urinate.  MAKE SURE YOU:  Understand these instructions.   Will watch your condition.   Will get help right away if you are not  doing well or get worse.  Document Released: 09/25/2005 Document Revised: 09/14/2011 Document Reviewed: 07/31/2011 [facility] Patient Information 2012 [place], [place]. Breastfeeding Challenges Breastfeeding is often the best way to feed your baby. Challenges may discourage you from breastfeeding. But solutions can usually be found to help you. Some babies have conditions that may interfere with or make breastfeeding more difficult. But, in all of the following cases, breastfeeding is still best for a baby's health.  ADVANTAGES OF BREASTFEEDING  Breastfed babies tend to be healthier and less affected by disease.   Breastfed babies may have better brain development and be less likely to be overweight than formulafed babies.   Breastfeeding also benefits the mother. It will give you time to be close to your baby and helps create a strong bond. It also:   Delays the return of your periods.   Stimulates your uterus to contract back to normal.   Helps you lose some of the weight you gained during pregnancy.   Breastfeeding is also cheaper than formula feeding. It also does not require mixing formula, heating bottles, or washing extra dishes.   Breastfeeding mothers have a lower risk of developing breast cancer.   Breastfeeding should be encouraged in women with gestational diabetes and diabetes type I and type II.  BREASTFEEDING CHALLENGES  Breastfeeding involves taking the time to get to know your baby's patterns and responding to his or her cues. Once breastfeeding is well established, feedings usually become more regular and more widely spaced. Some mothers do not nurse their babies because they come across problems early on. If at all possible, begin breastfeeding your baby within an hour after delivery. The first milk you produce is called colostrum. It is packed with nutrients and disease-fighting substances. These will help nourish and protect your baby against infections as he or she grows  up.   Some babies are unable to breastfeed because of premature birth and small size along with weakness and difficulty sucking. Sometimes with birth defects of the mouth (  cleft lip or cleft palate) the mother may be able to pump breastmilk to give to her baby. Some digestive problems (breast milk jaundice, galactosemia) may be reasons not to nurse. See a lactation consultant if you have a breast infection or breast abscess, breast cancer or other cancer, previous surgery or radiation treatment, or inadequate milk supply (uncommon).  SOME MOTHERS ARE ADVISED NOT TO BREASTFEED DUE TO HEALTH PROBLEMS SUCH AS:  Serious illnesses.   Severe malnutrition.   Undergoing radiation therapy.   Taking psychiatric medication.   Active herpes lesions on the breast.   Chickenpox or shingles.   Active, untreated tuberculosis.   HIV (human immunodeficiency virus) infection.   Drug or alcohol addiction.   Undergoing radioactive iodine therapy.   Leukemia human cell Type I or II.  BREASTFEEDING SHOULD NOT BE PAINFUL  It is natural for minor problems to arise in first time breastfeeding. Problems you may have and some solutions are as follows:   Nipple soreness may be caused by:   Improper position of baby.   Improper feeding techniques.   Improper nipple care.   For many women, there is no identified cause. A simple change in your baby's position while feeding may relieve nipple soreness. Some breastfeeding mothers report nipple soreness only during the initial adjustment period.   If there is tenderness at first, it should gradually go away as the days go by. Poor latch-on and positioning are common causes of sore nipples. This is usually because the baby is not getting enough of the areola into his or her mouth, and is sucking mostly on the nipple. The areola is the colored portion around the nipple. In general, nurse early and often. Nurse with the nipple and areola in the baby's mouth, not  just the nipple. And feed your baby on demand.   If you have sore nipples and put off feedings because of the pain, this can lead to your breasts becoming overly full. This may lead to plugged milk ducts in the breast followed by engorgement or even infection of the breasts. If your baby is latched on correctly, he or she should be able to nurse as long as needed without causing any pain. If it hurts, take the baby off of your breast and try again. Ask for help if it is still painful for you.   Check the positioning of your baby's body and the way she latches on and sucks. To minimize soreness, your baby's mouth should be open wide with as much of the areola in his or her mouth as possible. You should find that it feels better right away once the baby is positioned correctly.   Do not delay feedings. Try to relax so your let-down reflex comes easily. You also can hand-express a little milk before beginning the feeding so your baby does not clamp down harder, waiting for the milk to come.   If your nipples are very sore, it may be helpful to change positions each time you nurse. This puts the pressure on a different part of the nipple.   After nursing, you can also express a few drops of milk and gently rub it on your nipples. Human milk has natural healing properties. Let your nipples air-dry after feeding, or wear a soft-cotton shirt.   Wearing a nipple shield during nursing will not relieve sore nipples. They actually can prolong soreness by making it hard for the baby to learn to nurse without the shield.   Avoid wearing  bras or clothes that are too tight and put pressure on your nipples. If you wear a bra, get one that offers good support to your breasts.   Change nursing pads often to avoid trapping in moisture. Only use cotton pads.   Avoid using soap, ointments or other chemicals on your nipples. Make sure to avoid products that must be removed before nursing. Washing with clean water is all  that is necessary to keep your nipples and breasts clean.   Try rubbing pure lanolin on your nipples after breastfeeding to soothe the pain.   Making sure you get enough rest, eating healthy foods, and getting enough fluids also can help the healing process. If you have very sore nipples, you can ask your caregiver about using non-aspirin pain relievers.   Another cause of sore nursing is a condition called thrush. This is a fungal infection that can form on your nipples from the milk. Other signs of thrush include itching, flaking and drying skin, tender or pink skin. The infection also can form in the baby's mouth from having contact with your nipples. There it appears as little white spots on the inside of the cheeks, gums, or tongue. It also can appear as a diaper rash on your baby that will not go away by using regular diaper rash ointments. If you have any of these symptoms or think you have thrush, contact your doctor and your baby's doctor, or a Advertising copywriter. A lactation consultant is a breastfeeding Risk analyst. You can get medication for your nipples and for your baby.   If you still have sore nipples after following the above tips, you may need to see someone who is trained in breastfeeding, like a Advertising copywriter.  ENGORGEMENT Engorgement is a condition after pregnancy, when your breasts feel very hard and painful. You also may have breast swelling, tenderness, warmth, redness, throbbing and flattening of the nipple. Engorgement may cause a low-grade fever. This can be confused with a breast infection. Engorgement is the result of the milk building up. It usually happens during the third to fifth day after birth. This slows circulation. When blood and lymph move through the breasts, fluid from the blood vessels can seep into the breast tissues. All of the following can cause engorgement.  Poor positioning.   Infrequent feedings.   Giving supplementary bottles of  water, juice, formula, or breast milk or using a pacifier. All of these cut down on your feeding and may lead to engorgement.   Changing the breastfeeding schedule with decreasing in feeding.   The baby changes the nursing pattern.   Having a baby with a weak suck who is not able to nurse effectively.   Fatigue, stress, or anemia in the mother.   An overabundant milk supply.   Nipple damage.   Breast abnormalities.  Engorgement can lead to plugged ducts or a breast infection. So it is important to try to prevent it before this happens. If treated properly, engorgement should only last for one to two days.  Minimize engorgement by making sure the baby is latched on and positioned correctly at your breast.   Nurse frequently after birth. Allow the baby to nurse as long as he/she likes, as long as he/she is latched on well and sucking effectively.   In the early days when your milk is coming in, you should awaken a sleepy baby every 2 to 3 hours to breastfeed. Breastfeeding often on the affected side helps to remove the  milk and keeps milk moving freely. This prevents overfilling of the breast.   Avoid additional bottles and pacifiers.   Try hand expressing or pumping a little milk to first soften the breast, areola, and nipple before breastfeeding. Or massage the breast before feeding.   Cold compresses in between feedings can help ease pain and swelling.   If you are returning to work, try to pump your milk on the same schedule your baby was fed.   Eat a well balanced diet and drink plenty of fluids.   Use a well-fitting, supportive bra that is not too tight.   If your engorgement lasts for more than two days even after treating it, contact a Advertising copywriter.   Use a breast pump to keep up with your nursing schedule.   Use a breast pump if your baby is not taking enough milk or you feel you may be getting engorged.  PLUGGED DUCTS AND INFECTION Plugged ducts and breast  infection (mastitis) are common sources of sore breasts postpartum. It is common for many women to have a plugged duct in the breast at some point if breastfeeding.   A plugged milk duct feels like a tender, sore, lump in the breast. It is not accompanied by a fever or other symptoms. It happens when a milk duct does not properly drain. Then, pressure builds up behind the plug, and surrounding tissue becomes inflamed. A plugged duct usually only occurs in one breast at a time.   A breast infection (mastitis), on the other hand, is soreness or a lump in the breast that can be accompanied by a fever and/or flu-like symptoms. You may feel run down or very achy. Some women with a breast infection also have nausea and vomiting. You also may have yellowish discharge from the nipple that looks like colostrum. Or the breasts feel warm or hot to the touch and appear pink or red. A breast infection can occur when other family members have a cold or the flu. Like a plugged duct, it usually only occurs in one breast. It is not always easy to tell the difference between a breast infection and a plugged duct. Both have similar symptoms and can improve within 24 to 48 hours.   Treatment for plugged ducts and breast infections is similar. But some breast infections need to also be treated with an antibiotic.   If mastitis is not treated quickly, it may lead to a breast abscess.   It may help to massage the area, starting behind the sore spot. Use your fingers in a circular motion and massage toward the nipple.   Breastfeed more often on the affected side. This helps loosen the plug, keeps the milk moving freely, and the breast from becoming overly full. Nursing every two hours, both day and night on the affected side first can be helpful.   Getting extra sleep or relaxing with your feet up can help speed healing. Often a plugged duct or breast infection is the first sign that a mother is doing too much and becoming  overly tired.   Wear a well-fitting supportive bra that is not too tight, since this can constrict milk ducts.   If you do not feel better within 24 hours of trying these steps, and you have a fever or your symptoms worsen, call your doctor. You may need an antibiotic. Also, if you have a breast infection in which both breasts look affected, or there is pus or blood in the milk,  red streaks near the area, or your symptoms came on severely and suddenly, see your doctor right away.   Even if you need an antibiotic, continuing to breastfeed during treatment is best for both you and your baby. Most antibiotics will not affect your baby through your breast milk.  THRUSH Thrush (yeast) is a fungal infection that can form on your nipples or in your breast because it thrives on milk. The infection forms from an overgrowth of the candida organism. Candida usually exists in our bodies and is kept at healthy levels by the natural bacteria in our bodies. But, when the natural balance of bacteria is upset, candida can overgrow, causing an infection. Some of the things that can cause thrush include:   Having an overly moist environment on your skin or nipples that are sore or cracked.   Taking antibiotics, birth control pills or steroids.   Having a diet that contains large amounts of sugar or foods with yeast.   Having a chronic illness like HIV infection, diabetes, or anemia.  If you have sore nipples that last more than a few days even after you make sure your baby's latch and positioning is correct, or you suddenly get sore nipples after several weeks of unpainful nursing, you could have thrush. Some other signs of thrush include:   Pink, flaky, shiny, itchy or cracked nipples.   Deep pink and blistered nipples.   You also could have shooting pains deep in the breast during or after feedings, or achy breasts.  The infection also can form in your baby's mouth from having contact with your nipples, and  appear as little white spots on the inside of the cheeks, gums, or tongue. It also can appear as a diaper rash (small red dots around a rash) on your baby that will not go away by using regular diaper rash ointments. Many babies with thrush refuse to nurse, or are gassy or cranky.  Solution:   If you or your baby have any of these symptoms, contact your doctor and your baby's doctor so you both can be correctly diagnosed.   You can get medication for your nipples and for your baby. Medication for a mother is usually an ointment for the nipples. Your baby can be given a liquid medication for his/her mouth, and/or an ointment for any diaper rash.   Change disposable nursing pads often. Wash any towels or clothing that come in contact with the yeast in very hot water (above 122 F or 50 C).   Wear a clean bra every day. Wash your hands often, and wash your baby's hands often, especially if he or she sucks on his/her fingers.   Only use cotton pads.   Boil any pacifiers, bottle nipples, or toys your baby puts in his or her mouth once a day for 20 minutes to kill the thrush. After one week of treatment, discard pacifiers and nipples and buy new ones.   Boil daily for 20 minutes all breast pump parts that touch the milk.   Make sure other family members are free of thrush or other fungal infections. If they have symptoms, get them treatment.  NURSING STRIKE A nursing strike is when your baby has been nursing well for months, then suddenly loses interest in breastfeeding and begins to refuse the breast. A nursing strike can mean several things are happening with your baby and that she or he is trying to communicate with you to let you know that something is  wrong. Not all babies will react the same to different situations that can cause a nursing strike. Some will continue to breastfeed without a problem, others may just become fussy at the breast, and others will refuse the breast entirely. Some of the  major causes of a nursing strike include:  Mouth pain from teething, a fungal infection, or a cold sore.   An ear infection.   Pain from a certain nursing position.   Being upset about a long separation from the mother or a major change in routine.   Being interested in other things around him or her.   A cold or stuffy nose that makes breathing difficult.   Reduced milk supply from supplementing with bottles or overuse of a pacifier.   Responding to the mother's strong reaction if the baby has bitten her.   Being upset about hearing arguing or people talking in a harsh voice with other family members while nursing.   Reacting to stress, over-stimulation, or having been repeatedly put off when wanting to nurse.   If your baby is on a nursing strike, it is normal to feel frustrated and upset, especially if your baby is unhappy. It is important not to feel guilty or that you have done something wrong. Your breasts also may become uncomfortable as the milk builds up.  Treatment  Try to express your milk manually or with a breast pump on the same schedule as the baby used to breastfeed to avoid engorgement and plugged ducts.   Try another feeding method temporarily to give your baby your milk, such as a cup, dropper, or spoon. Keep track of your baby's wet diapers to make sure he/she is getting enough milk (5 to 6 per day).   Keep offering your breast to the baby. If the baby is frustrated, stop and try again later. Try when the baby is sleeping or very sleepy.   Try various breastfeeding positions.   Focus on the baby with all of your attention and comfort him or her with extra touching and cuddling.   Try nursing while rocking and in a quiet room free of distractions.  INVERTED OR LARGE NIPPLES Some women have nipples that naturally are inverted, or that turn inward instead of protruding, or that are flat and do not protrude. Inverted or flat nipples can sometimes make it harder to  breastfeed because your baby can have a harder time latching on. But remember that for breastfeeding to work, your baby has to latch on to both the nipple and the breast, so even inverted nipples can work just fine. Very large nipples can make it hard for the baby to get enough of the areola into his or her mouth to compress the milk ducts and get enough milk.  Know what type of nipples you have before you have your baby, so you can be prepared in case you have a problem getting your baby to latch on correctly.   Talk with a lactation consultant at the hospital or at a breastfeeding clinic for extra help if you have flat, inverted, or very large nipples.   Sometimes a Advertising copywriter can help inverted nipples to be pulled out with a small device before your baby is brought to your breast.   In many cases, inverted nipples will protrude more as the baby starts to latch on and as time passes. The baby's sucking will help.   Flat nipples cause fewer problems than inverted nipples. Good latch-on and positioning are  usually enough to ensure that a baby latched to a flat nipple breastfeeds well.   The latch for babies of mothers with very large nipples will improve with time as the baby grows. In some cases, it might take several weeks to get the baby to latch well. A good milk supply helps insure that her baby will get enough milk.  RETURNING TO WORK More and more women are breastfeeding when they return to work because they believe in the benefits of breastfeeding. You can purchase or rent effective breast pumps and storage containers for your milk. Many employers are willing to set up special rooms for mothers who pump. But others are not as educated about the benefits of breastfeeding. Also, many women are not able to take off as much time as they'd like after having their babies and might have to return to work before breastfeeding is well established. After you have your baby, take as much time  off as possible. This will help you get your breastfeeding established well and may reduce the number of months you may need to pump your milk while you are at work.   If you plan to have your baby take a bottle of expressed breast milk while you are at work, you can introduce your baby to a bottle when he or she is around four weeks old. Otherwise, the baby might not accept the bottle later on. Once your baby is comfortable taking a bottle, it is a good idea to have Dad or another family member offer a bottle of pumped breast milk on a regular basis so the baby stays in practice.   Let your employer or Geophysical data processor know that you plan to continue breastfeeding once you return to work. Before you return to work, or even before you have your baby, start talking with your employer about breastfeeding. Do not be afraid to request a clean and private area where you can pump your milk. If you do not have your own office space, you can ask to use a supervisor's office during certain times. Or you can ask to have a clean, clutter free corner of a storage room. All you need is a chair, a small table, and an outlet if you are using an electric pump. Many electric pumps also can run on batteries and do not require an outlet. You can lock the door and place a small sign on it that asks for some privacy. You can pump your breast milk during lunch or other breaks. You could suggest to your employer that you are willing to make up work time for time spent pumping milk.   After pumping, you can refrigerate your milk, place it in a cooler, or freeze it for the baby to be fed later. Many breast pumps come with carrying cases that have a section to store your milk with ice packs. If you do not have access to a refrigerator, you can leave it at room temperatures for up to 6 hours.   Many employers are not aware of state laws that state they have to allow you to breastfeed at your job. Under these laws, your  employer is required to set up a space for you to breastfeed and/or allow paid/unpaid time for breastfeeding employees. To see if your state has a breastfeeding law for employers, go to https://www.warren.info/ or call us at 1-800-LALECHE (in [place]).  JAUNDICE  Jaundice is a condition that is common in many newborns. It appears as a  yellowing of the skin and eyes. It is caused by an excess of bilirubin, a yellow pigment that is a product in the blood. All babies are born with extra red blood cells that undergo a process of being broken down and eliminated from the body. Bilirubin levels in the blood can be high because of:  Higher production of it in a newborn.   Increased ability of the newborn intestine to absorb it.   Limited ability of the newborn liver to handle large amounts of it.  Many cases of jaundice do not need to be treated. Your baby's doctor will carefully monitor your baby's bilirubin levels. Sometimes infants have to be temporarily separated from their mothers to receive special treatment with phototherapy (aiming lights on the baby). In these cases, breastfeeding is encouraged but supplements may also be given to the baby. American Academy of Pediatrics advises against stopping breastfeeding in jaundiced babies and suggests continuing frequent breastfeeding, even during treatment. If your baby is jaundiced or develops jaundice, it is important to discuss with your baby's caregiver all possible treatment options. Share that you do not want to interrupt nursing if this is at all possible.  REFLUX  It is not unusual for babies to spit up after nursing. Usually, babies can spit up and show no other signs of illness. The spitting up disappears as the baby's digestive system matures. As long as the baby has 6 to 8 wet diapers and at least 2 bowel movements in a 24 hour period (under 36 weeks of age), and your baby is gaining weight (at least 4 ounces a week) you can be assured your  baby is getting enough milk.  However, some babies have a condition called gastroesophageal reflux (GER). This happens when the muscle at the opening of the stomach opens at the wrong times, allowing milk and food to come back up into the the tube in the throat (esophagus). Symptoms of GER can include:   Crying as if in discomfort.   Waking up frequently at night.   Problems swallowing.   Frequent red or sore throat.   Signs of asthma, bronchitis, wheezing or problems breathing.   Projectile vomiting.   Arching of the back as if in severe pain.   Slow weight gain.   Gagging or choking.   Frequent hiccupping or burping.   Severe spitting up, or spitting up after every feeding, or hours after eating.   Refusal to eat.  Many healthy babies might have some of these symptoms and do not have GER. But there are babies who might only have a few of these symptoms and have a severe case of GER. Not all babies with GER spit up or vomit.  Some babies with GER do not have a serious medical problem. But caring for them can be hard since they tend to be very fussy and wake up frequently at night. More severe cases of GER may need to be treated with medication if the baby, in addition to spitting up, also refuses to nurse, gains weight poorly or is losing weight, or has periods of gagging or choking.   If your baby spits up after every feeding and has any of the other symptoms mentioned above, it is best to see his or her doctor for a correct diagnosis. Other than GER, your baby could have another condition that needs treatment. If there are no other signs of illness, he/she could just be sensitive to a food in your diet or a  medication he/she is receiving. If your baby has GER, it is important to try to continue to breastfeed since breast milk still is more easily digested than formula. Try smaller, more frequent feedings, thorough burping, and putting the baby in an upright position during and after  feedings.  CLEFT PALATE AND CLEFT LIP  Cleft palate and cleft lip are some of the most common birth defects that happen as a baby is developing in the womb. A cleft, or opening, in either the palate or lip can happen together or separately and both can be corrected through surgery. Both conditions can prevent babies from breastfeeding because a baby cannot form a good seal around the nipple and areola with his or her mouth, or get milk out of the breast well.   Cleft palate can happen on one or both sides of a baby's mouth and be partial or complete. Right after birth, a mother whose baby has a cleft palate can try to breastfeed her baby, and she can start expressing her milk right away to keep up her supply. Even if her baby cannot latch on well to her breast, the baby can be fed breast milk by cup. In some hospitals, babies with cleft palate are fitted with a mouthpiece called an obturator. This fits into the cleft and seals it for easier feeding. The baby should be able to exclusively breastfeed after surgery.   Cleft lip can happen on one or both sides of a baby's lip. But a mother can try different breastfeeding positions and use her thumb or breast to help fill in the opening left by the lip to form a seal around the breast. With cleft lip repair, breastfeeding may only have to be stopped for a few hours.   If your baby is born with a cleft palate or cleft lip, talk with a lactation consultant in the hospital for assistance as soon as possible. Human milk and early breastfeeding is still best for your baby's health.  TWINS OR MULTIPLES Mothers of twins or multiples might feel overwhelmed with the idea of breastfeeding more than one baby at a time. The benefits of human milk to both these mothers and babies are the same as for all mothers and babies. When breastfeeding twins, your breast milk will increase to the amount the babies will need. You will have to take in more calories and liquids when  nursing twins. If the babies are premature and unable to nurse, you can pump your breasts and freeze the milk until the babies are ready to nurse. But mothers of multiples get even more benefits from breastfeeding:  Their uterus contracts, which is helpful because it has stretched even more to hold more than one baby.   Hormones are released that relax the mother, which is helpful with the added stress of caring for more than one baby.   Eight to ten hours per week are saved because there is no need to prepare formula or bottles and the mother's milk is available right away.   Breastfeeding early and often for a mother of multiples is important to keep up her milk supply. A good latch-on for each baby is important to avoid sore nipples. Many mothers find that it is easier to nurse the babies together rather than separately, and that it gets easier as the babies get older. There are many breastfeeding holds that make it easier to nurse more than one baby at a time. If you are having multiples, talk with  a Advertising copywriter about more ways you can successfully breastfeed your babies.  BREASTFEEDING DURING PREGNANCY While most mothers who are nursing a toddler stop breastfeeding if they find out they are pregnant, it is an individual choice to decide whether to keep breastfeeding during the pregnancy. It is not unsafe for the unborn child if you continue to breastfeed an older child during this time. But, if you are having some problems in your pregnancy such as uterine pain or bleeding, a history of preterm labor or problems gaining weight during pregnancy, your doctor may advise you to wean. Your child also may decide to wean on his or her own because pregnancy changes the amount and flavor of your milk. Some women also choose to wean at this time because they have nipple soreness caused by pregnancy hormones, are nauseous, tire more easily, or find that their growing stomachs make breastfeeding  uncomfortable. You will need more calories when you breastfeed while pregnant. Your milk production usually slows down around the fourth month of pregnancy.  BREASTFEEDING AFTER BREAST SURGERY   If you have had breast surgery, including breast implants, you might be worried about whether you will be able to breastfeed. The most important things that affect whether you can produce enough milk for your baby are how your surgery was done and where your incisions are, and the reasons for your surgery. For example, women who have had incisions in the fold under the breasts are less likely to have problems producing milk than women who have had incisions around or across the areola. Incisions around the areola can cut into milk ducts and nerves, where milk is produced and travels. Women who have had breast surgery to augment breasts that never fully developed may not have enough glands to produce a full milk supply.   If you had breast surgery and are worried about how it will affect breastfeeding, talk with a Advertising copywriter. If you are planning breast surgery and are worried about how it will affect breastfeeding, talk with your surgeon about ways he or she can preserve as much of the breast tissue and milk ducts as possible.  INDUCING LACTATION  Many mothers who adopt want to breastfeed their babies and can do it successfully with some help. It is successful ? to  of the time it is tried. Many will need to supplement their breast milk with donated breast milk or infant formula. But some adoptive mothers can breastfeed exclusively, especially if they have been pregnant before. Lactation is a hormonal response to a physical action. So the stimulation of the baby nursing causes the body to see a need for and produce milk. The more the baby nurses, the more a woman's body will produce milk.   Beginning a combination of hormone treatment several months before the baby is born should help facilitate lactation  in many cases.   One thing you can do to prepare is to pump every 3 hours around the clock for two to three weeks before your baby arrives. Or you can wait until the baby arrives and starts to nurse. Breast milk can be frozen until you are ready to nurse the baby. A supplemental nursing system (SNS) or a lactation aid can help ensure that your baby gets enough nutrition and that your breasts are stimulated to produce milk at the same time.   If you are an adoptive mother who wants to breastfeed, you should see both a Advertising copywriter and your doctor for help  in establishing an initial milk supply.  FOR MORE INFORMATION [employer]: www.llli.org Document Released: 03/19/2006 Document Revised: 09/14/2011 Document Reviewed: 06/10/2008 [facility] Patient Information 2012 [place], [place].

## 2012-04-03 NOTE — Discharge Summary (Signed)
Obstetric Discharge Summary Reason for Admission: onset of labor Prenatal Procedures: ultrasound Intrapartum Procedures: spontaneous vaginal delivery and epidural; amnioinfusion for variables; Terb for tachysystole. Postpartum Procedures: Rubella Ig Complications-Operative and Postpartum: MLE Hemoglobin  Date Value Range Status  04/01/2012 11.1* 12.0 - 15.0 g/dL Final     HCT  Date Value Range Status  04/01/2012 33.7* 36.0 - 46.0 % Final  Hospital Course:  Pt admitted in active labor at [redacted]w[redacted]d at 4.5 cm.  Received epidural soon after admission.  Cx unchanged thereafter, and AROM for clear fluid at 0002 on 04/01/12 and cx 5cm.  Tachysystole noted soon after and moderate variables noted; Terb given and amnioinfusion started.  Dr. Pennie Rushing called to bedside for assessment.  Labor progressed rapidly after AROM.  FHR stable w/ interventions.  Cx complete at 0111.  Pushing started at 0120 and SVD over MLE at 0204.  PP recovery unremarkable.  Up ad lib and tol po.  Voiding w/o difficulty.  Breastfeeding going well.  C/o back pain on DOD, and comfort measures disc'd.  VB WNL. Pt considering Depo PP; will give Rx at [facility] visit.    Physical Exam:  General: alert, cooperative and no distress Breasts:  Bilateral breasts intact; no cracking or bruising.   Lochia: appropriate, rubra Uterine Fundus: firm; u/-2 Incision: n/a DVT Evaluation: No evidence of DVT seen on physical exam. Negative Homan's sign. No significant calf/ankle edema.  Discharge Diagnoses: Term Pregnancy-delivered and lactating; IUGR; stable asthma; rubella non-immune;   Discharge Information: Date: 04/03/2012 PPD#2 Activity: pelvic rest Diet: routine Medications: PNV, Ibuprofen and symbicort & albuterol as previously prescribed;  Condition: stable Instructions: refer to practice specific booklet Discharge to: home Follow-up Information    Follow up with [facility]. Schedule an appointment as soon as possible for a visit  in 6 weeks. (or call as needed with any questions or concerns)          Newborn Data: Live born female "Cherish" (delivery provider: Sanda Klein, CNM). Birth Weight: 5 lb 10.3 oz (2560 g) APGAR: 9, 10  Home with mother.  Tjay Velazquez H 04/03/2012, 9:54 AM

## 2012-04-04 ENCOUNTER — Ambulatory Visit (HOSPITAL_COMMUNITY)
Admission: RE | Admit: 2012-04-04 | Discharge: 2012-04-04 | Disposition: A | Discharge status: Home / Self Care | Payer: Medicaid Other | Source: Ambulatory Visit | Attending: Obstetrics and Gynecology | Admitting: Obstetrics and Gynecology

## 2012-04-04 NOTE — Progress Notes (Signed)
Adult Lactation Consultation Outpatient Visit Note  Patient Name: Marie Holt Date of Birth: May 18, 1991 Gestational Age at Delivery: [redacted]w[redacted]d Type of Delivery:   Infants Name:  Cherish Maynor DOB:  [birth year] @ 0204 Vaginal Delivery Birth weight:  2560 grams (5 lbs, 10.3 oz) Discharge weight:  2385 grams (5 lbs, 4.1 oz) Today's weight:  2348 grams (5 lbs, 2.8 oz.)  From infant's chart bilirubin levels - 12.6 at 53 hours; Serum bili 12.3 at 58 hrs.  Bilirubin was re-checked today by hospital lab.  Mother and grandmother appeared to [facility] office at 1200 asking for appointment because the baby was constantly hungry yesterday and today will not eat.  Appointment made for 1430 today.  Upon inspection at consultation breasts are soft with scabs on nipples.  Hand expression taught with only one drop of colostrum noted.  Attempted to latch infant, but infant was too sleepy.  Breastfeeding History:   Frequency of Breastfeeding: Exclusively breastfed in hospital, but last night began giving formula via bottle because infant was too sleepy to breastfeed.  Mom has not breastfed infant since 6:00 pm last night (>20 hours ago). Length of Feeding: none since last night by breast.  Formula feeding by bottle. Voids: could not get a good report from mom Stools: could not get a good report from mom  Supplementing / Method: Pumping:  Type of Pump:has hand pump but has not used it     Comments: Has been giving infant "Enfamil can milk" via bottle since last night every 2 hrs.  Infant has only consumed 15 ml or less with each feeding.  Discussed jaundice and the sleepy behaviors associated with jaundice.  Educated on infant's need to consume appropriate amounts for stooling to decrease jaundice levels.  Lots education done about early feeding cues and cluster feeding and milk production.  Mom and paternal grandmother verbalized understanding.     Consultation Evaluation:  Initial Feeding Assessment: Pre-feed  Weight: 2348 grams (5 lbs, 2.8 oz) Comments:  Could not get infant to open mouth for feeding; too sleepy.  Finally got bottle of formula in mouth; infant ate 15 ml via bottle from LC and additional 15 ml via bottle from grandmother over 5-10 minutes.   Total Breast milk Transferred this Visit: 0 Total Supplement Given: 30 ml formula  Additional Interventions: Plan of Care: Hardcopy given to mother 1.  Begin awaking infant every 1.5-2 hours so that she is on the breast by 2-2.5 hours for feeding; skin-to-skin for feedings with mom. 2.  Assure a deep latch - Keep her feeding for 15-20 minutes on each breast.  Let her finish first breast even if it is more than 20 minutes before switching her over.   3.  If infant does not feed by breast, then use hand pump and pump each side for 20 minutes with good massaging and compressions.  Hand express at end of pumping.  (Consulting WIC was discussed). 4.  Feed infant any milk pumped or formula based on supplementation guidelines (orange sheet given). 5.  Use comfort gels according to package instructions.   6.  Yellow feeding log sheets given to mom and explained how to use.  Instructed her to take log sheets with her tomorrow for pediatrician visit.     Follow-Up Infant has follow up visit with peds tomorrow according to chart. Mom & Infant have a lactation consultation visit next week Wed, July 3 @ 1430      Lendon Ka 04/04/2012, 4:01 PM

## 2012-04-05 ENCOUNTER — Encounter: Payer: Self-pay | Admitting: Obstetrics and Gynecology

## 2012-04-05 ENCOUNTER — Telehealth: Payer: Self-pay | Admitting: Obstetrics and Gynecology

## 2012-04-05 ENCOUNTER — Ambulatory Visit (INDEPENDENT_AMBULATORY_CARE_PROVIDER_SITE_OTHER): Payer: Medicaid Other | Admitting: Obstetrics and Gynecology

## 2012-04-05 VITALS — BP 108/72 | Temp 98.2°F | Wt 120.5 lb

## 2012-04-05 DIAGNOSIS — G8918 Other acute postprocedural pain: Secondary | ICD-10-CM

## 2012-04-05 DIAGNOSIS — N949 Unspecified condition associated with female genital organs and menstrual cycle: Secondary | ICD-10-CM

## 2012-04-05 NOTE — Progress Notes (Signed)
20 YO S/P SVB 04/01/12 complains of pain on right side of episiotomy.  Denies any drainage.  Has only used sitz bath once. Denies fever. Patient is breastfeeding.  O: Pelvic: EGBUS-right vulva mildly edematous and tender, sutures intact, no adenopathy or evidence of infection   A: Episiotomy Check  P:  Take Ibuprofen 600 mg q 6 hrs. pc around the clock for       5 days then prn        Sitz baths in lukewarm water 4 times a day for 15 minutes       each time.        Ice packs to perineum x 20 minutes when reclining       If symptoms don't improve by Monday, July 1st-call       RTO-PP visit or prn  Dajahnae Vondra, PA-C

## 2012-04-05 NOTE — Patient Instructions (Addendum)
Do Sitz baths 4 times a day in lukewarm water for 15 minutes each time  You may use ice pack on perineum whenever you are laying down for 20 minutes each time.  Take Ibuprofen 600 mg with food every 6 hours around the clock for the next 5 days  Call if no better on Monday, July 1st

## 2012-04-05 NOTE — Telephone Encounter (Signed)
Spoke with pt rgd concerns pt states delivered on 04/01/12 tore and had to get stitches pt states stitches are very painful only on right side no relief with meds pt has appt 04/05/12 at 2:00 with EP for eval pt voice understanding

## 2012-04-10 ENCOUNTER — Ambulatory Visit (HOSPITAL_COMMUNITY): Admission: RE | Admit: 2012-04-10 | Payer: Medicaid Other | Source: Ambulatory Visit

## 2012-04-17 ENCOUNTER — Observation Stay (HOSPITAL_COMMUNITY)
Admission: AD | Admit: 2012-04-17 | Discharge: 2012-04-18 | Disposition: A | Discharge status: Home / Self Care | Payer: Medicaid Other | Source: Ambulatory Visit | Attending: Obstetrics and Gynecology | Admitting: Obstetrics and Gynecology

## 2012-04-17 ENCOUNTER — Encounter (HOSPITAL_COMMUNITY): Payer: Self-pay

## 2012-04-17 ENCOUNTER — Inpatient Hospital Stay (HOSPITAL_COMMUNITY): Payer: Medicaid Other

## 2012-04-17 DIAGNOSIS — N200 Calculus of kidney: Secondary | ICD-10-CM | POA: Insufficient documentation

## 2012-04-17 DIAGNOSIS — O26839 Pregnancy related renal disease, unspecified trimester: Principal | ICD-10-CM | POA: Insufficient documentation

## 2012-04-17 DIAGNOSIS — O9089 Other complications of the puerperium, not elsewhere classified: Principal | ICD-10-CM | POA: Insufficient documentation

## 2012-04-17 DIAGNOSIS — M549 Dorsalgia, unspecified: Secondary | ICD-10-CM | POA: Insufficient documentation

## 2012-04-17 LAB — URINE MICROSCOPIC-ADD ON

## 2012-04-17 LAB — URINALYSIS, ROUTINE W REFLEX MICROSCOPIC
Bilirubin Urine: NEGATIVE
Glucose, UA: NEGATIVE mg/dL
Ketones, ur: NEGATIVE mg/dL
Nitrite: NEGATIVE
Protein, ur: 30 mg/dL — AB
Specific Gravity, Urine: 1.025 (ref 1.005–1.030)
Specific Gravity, Urine: >1.03 — ABNORMAL HIGH (ref 1.005–1.030)
Urobilinogen, UA: 0.2 mg/dL (ref 0.0–1.0)
pH: 6 (ref 5.0–8.0)

## 2012-04-17 LAB — CBC WITH DIFFERENTIAL/PLATELET
Basophils Absolute: 0 10*3/uL (ref 0.0–0.1)
Eosinophils Absolute: 0.1 10*3/uL (ref 0.0–0.7)
Eosinophils Relative: 1 % (ref 0–5)
Lymphocytes Relative: 22 % (ref 12–46)
Lymphs Abs: 3.2 10*3/uL (ref 0.7–4.0)
MCH: 29.9 pg (ref 26.0–34.0)
MCV: 94.1 fL (ref 78.0–100.0)
Neutrophils Relative %: 70 % (ref 43–77)
Platelets: 459 10*3/uL — ABNORMAL HIGH (ref 150–400)
RBC: 4.05 MIL/uL (ref 3.87–5.11)
RDW: 12.8 % (ref 11.5–15.5)
WBC: 14.3 10*3/uL — ABNORMAL HIGH (ref 4.0–10.5)

## 2012-04-17 LAB — COMPREHENSIVE METABOLIC PANEL
ALT: 12 U/L (ref 0–35)
AST: 16 U/L (ref 0–37)
Albumin: 3.3 g/dL — ABNORMAL LOW (ref 3.5–5.2)
Alkaline Phosphatase: 131 U/L — ABNORMAL HIGH (ref 39–117)
Calcium: 9.8 mg/dL (ref 8.4–10.5)
Glucose, Bld: 98 mg/dL (ref 70–99)
Potassium: 3.1 mEq/L — ABNORMAL LOW (ref 3.5–5.1)
Sodium: 142 mEq/L (ref 135–145)
Total Protein: 7 g/dL (ref 6.0–8.3)

## 2012-04-17 MED ORDER — OXYCODONE-ACETAMINOPHEN 5-325 MG PO TABS
1.0000 | ORAL_TABLET | ORAL | Status: DC | PRN
  Administered 2012-04-17 – 2012-04-18 (×2): 1 via ORAL
  Administered 2012-04-18: 2 via ORAL
  Administered 2012-04-18: 1 via ORAL
  Filled 2012-04-17: qty 1
  Filled 2012-04-17: qty 2
  Filled 2012-04-17 (×2): qty 1

## 2012-04-17 MED ORDER — ONDANSETRON HCL 4 MG/2ML IJ SOLN: 4.0000 mg | INTRAMUSCULAR | Status: DC | PRN

## 2012-04-17 MED ORDER — PROMETHAZINE HCL 25 MG/ML IJ SOLN
12.5000 mg | INTRAMUSCULAR | Status: DC | PRN
  Administered 2012-04-17 (×2): 12.5 mg via INTRAVENOUS
  Filled 2012-04-17 (×2): qty 1

## 2012-04-17 MED ORDER — LACTATED RINGERS IV BOLUS (SEPSIS)
500.0000 mL | Freq: Once | INTRAVENOUS | Status: AC
  Administered 2012-04-17: 500 mL via INTRAVENOUS

## 2012-04-17 MED ORDER — IOHEXOL 300 MG/ML  SOLN
100.0000 mL | Freq: Once | INTRAMUSCULAR | Status: AC | PRN
  Administered 2012-04-17: 100 mL via INTRAVENOUS

## 2012-04-17 MED ORDER — DIPHENHYDRAMINE HCL 50 MG/ML IJ SOLN
25.0000 mg | Freq: Once | INTRAMUSCULAR | Status: AC
  Administered 2012-04-17: 25 mg via INTRAVENOUS
  Filled 2012-04-17: qty 1

## 2012-04-17 MED ORDER — MORPHINE SULFATE 4 MG/ML IJ SOLN
2.0000 mg | INTRAMUSCULAR | Status: DC | PRN
  Administered 2012-04-17: 2 mg via INTRAVENOUS
  Filled 2012-04-17: qty 1

## 2012-04-17 MED ORDER — BUTORPHANOL TARTRATE 2 MG/ML IJ SOLN
2.0000 mg | INTRAMUSCULAR | Status: DC | PRN
  Administered 2012-04-17 (×2): 2 mg via INTRAVENOUS
  Filled 2012-04-17 (×2): qty 1

## 2012-04-17 MED ORDER — LACTATED RINGERS IV SOLN
INTRAVENOUS | Status: DC
  Administered 2012-04-17 – 2012-04-18 (×5): via INTRAVENOUS

## 2012-04-17 MED ORDER — ONDANSETRON 8 MG/NS 50 ML IVPB
8.0000 mg | Freq: Once | INTRAVENOUS | Status: AC
  Administered 2012-04-17: 8 mg via INTRAVENOUS
  Filled 2012-04-17: qty 8

## 2012-04-17 NOTE — MAU Note (Signed)
Right sided abdominal pain that radiates to the back for the last 3-4 hours. Patient states it feels like she has to urinate all the time, like something is sitting on her bladder. No fever or chills.

## 2012-04-17 NOTE — Progress Notes (Addendum)
Rn calls pt with emesis green now, Intake 725 IV output 150 no sediment Results for orders placed during the hospital encounter of 04/17/12 (from the past 24 hour(s))  URINALYSIS, ROUTINE W REFLEX MICROSCOPIC     Status: Abnormal   Collection Time   04/17/12  4:00 AM      Component Value Range   Color, Urine YELLOW  YELLOW   APPearance CLEAR  CLEAR   Specific Gravity, Urine 1.025  1.005 - 1.030   pH 6.0  5.0 - 8.0   Glucose, UA NEGATIVE  NEGATIVE mg/dL   Hgb urine dipstick LARGE (*) NEGATIVE   Bilirubin Urine NEGATIVE  NEGATIVE   Ketones, ur NEGATIVE  NEGATIVE mg/dL   Protein, ur 30 (*) NEGATIVE mg/dL   Urobilinogen, UA 0.2  0.0 - 1.0 mg/dL   Nitrite NEGATIVE  NEGATIVE   Leukocytes, UA TRACE (*) NEGATIVE  URINE MICROSCOPIC-ADD ON     Status: Abnormal   Collection Time   04/17/12  4:00 AM      Component Value Range   Squamous Epithelial / LPF FEW (*) RARE   WBC, UA 3-6  <3 WBC/hpf   RBC / HPF 11-20  <3 RBC/hpf   Bacteria, UA MANY (*) RARE  URINALYSIS, ROUTINE W REFLEX MICROSCOPIC     Status: Abnormal   Collection Time   04/17/12  4:43 AM      Component Value Range   Color, Urine YELLOW  YELLOW   APPearance CLEAR  CLEAR   Specific Gravity, Urine >1.030 (*) 1.005 - 1.030   pH 6.0  5.0 - 8.0   Glucose, UA NEGATIVE  NEGATIVE mg/dL   Hgb urine dipstick NEGATIVE  NEGATIVE   Bilirubin Urine NEGATIVE  NEGATIVE   Ketones, ur NEGATIVE  NEGATIVE mg/dL   Protein, ur 30 (*) NEGATIVE mg/dL   Urobilinogen, UA 0.2  0.0 - 1.0 mg/dL   Nitrite NEGATIVE  NEGATIVE   Leukocytes, UA NEGATIVE  NEGATIVE  URINE MICROSCOPIC-ADD ON     Status: Abnormal   Collection Time   04/17/12  4:43 AM      Component Value Range   Squamous Epithelial / LPF RARE  RARE   WBC, UA 3-6  <3 WBC/hpf   RBC / HPF 7-10  <3 RBC/hpf   Bacteria, UA FEW (*) RARE  CBC WITH DIFFERENTIAL     Status: Abnormal   Collection Time   04/17/12  4:53 AM      Component Value Range   WBC 14.3 (*) 4.0 - 10.5 K/uL   RBC 4.05  3.87 -  5.11 MIL/uL   Hemoglobin 12.1  12.0 - 15.0 g/dL   HCT 21.3  08.6 - 57.8 %   MCV 94.1  78.0 - 100.0 fL   MCH 29.9  26.0 - 34.0 pg   MCHC 31.8  30.0 - 36.0 g/dL   RDW 46.9  62.9 - 52.8 %   Platelets 459 (*) 150 - 400 K/uL   Neutrophils Relative 70  43 - 77 %   Neutro Abs 9.9 (*) 1.7 - 7.7 K/uL   Lymphocytes Relative 22  12 - 46 %   Lymphs Abs 3.2  0.7 - 4.0 K/uL   Monocytes Relative 7  3 - 12 %   Monocytes Absolute 1.0  0.1 - 1.0 K/uL   Eosinophils Relative 1  0 - 5 %   Eosinophils Absolute 0.1  0.0 - 0.7 K/uL   Basophils Relative 0  0 - 1 %   Basophils  Absolute 0.0  0.0 - 0.1 K/uL  COMPREHENSIVE METABOLIC PANEL     Status: Abnormal   Collection Time   04/17/12  4:53 AM      Component Value Range   Sodium 142  135 - 145 mEq/L   Potassium 3.1 (*) 3.5 - 5.1 mEq/L   Chloride 103  96 - 112 mEq/L   CO2 27  19 - 32 mEq/L   Glucose, Bld 98  70 - 99 mg/dL   BUN 7  6 - 23 mg/dL   Creatinine, Ser 3.08  0.50 - 1.10 mg/dL   Calcium 9.8  8.4 - 65.7 mg/dL   Total Protein 7.0  6.0 - 8.3 g/dL   Albumin 3.3 (*) 3.5 - 5.2 g/dL   AST 16  0 - 37 U/L   ALT 12  0 - 35 U/L   Alkaline Phosphatase 131 (*) 39 - 117 U/L   Total Bilirubin 0.2 (*) 0.3 - 1.2 mg/dL   GFR calc non Af Amer >90  >90 mL/min   GFR calc Af Amer >90  >90 mL/min  AMYLASE     Status: Normal   Collection Time   04/17/12  4:53 AM      Component Value Range   Amylase 50  0 - 105 U/L  LIPASE, BLOOD     Status: Normal   Collection Time   04/17/12  4:53 AM      Component Value Range   Lipase 29  11 - 59 U/L  A RLQ pain P CT abd pelvis without contrast RLQ pain, r/o appy, r/o kidney stone, IV stadol, phenergan, collaboration with Dr. Pennie Rushing per telephone at [phone], with review of labs, progress note. Lavera Guise, CNM

## 2012-04-17 NOTE — Consult Note (Addendum)
Patient and RN are inquiring as to whether morphine is compatible with BF.  Per Sheffield Slider it is an L3 .  He states it is the opiate of choice due to its poor bioavailibilty.   The AAP statement is that it is a maternal medication usually compatible  with BF.  Advised RN to share info with the mother so that she could make an informed decision.  Also encouraged her to pump prior to receiving morphine when the levels would be the lowest.

## 2012-04-17 NOTE — Progress Notes (Signed)
History   presents with c/o of "pain x 3-4 hours, had pain like this a few days ago that hurt for a few hours, ate at 9 pm sausage biscuit"denies N,V,D, c/o of urinary frequency" just keeps feeling like I need to pee again, some constipation last bm 2 days ago" little vaginal bleeding, SVD uncomplicated 04/01/12  Breastfeeding, no intercouse since pregnancy, denies physical abuse.  Med: Ibuprofen 400 mg at 0330          symbacort as needed not used today Chief Complaint  Patient presents with  . Abdominal Pain   @SFHPI @  OB History    Grav Para Term Preterm Abortions TAB SAB Ect Mult Living   1 1 1       1     04/01/2012 SVD uncomplicated  Past Medical History  Diagnosis Date  . Asthma     Past Surgical History  Procedure Date  . No past surgeries     Family History  Problem Relation Age of Onset  . Anesthesia problems Neg Hx   . Von Willebrand disease Sister   . Hypertension Maternal Grandmother   . Cancer Paternal Grandmother     unknown origin  . Cancer Paternal Grandfather     unknown origin    History  Substance Use Topics  . Smoking status: Never Smoker   . Smokeless tobacco: Never Used  . Alcohol Use: No    Allergies: No Known Allergies  Prescriptions prior to admission  Medication Sig Dispense Refill  . albuterol (PROVENTIL HFA;VENTOLIN HFA) 108 (90 BASE) MCG/ACT inhaler Inhale 2 puffs into the lungs every 6 (six) hours as needed. For shortness of breath      . budesonide-formoterol (SYMBICORT) 160-4.5 MCG/ACT inhaler Inhale 2 puffs into the lungs 2 (two) times daily.        Marland Kitchen ibuprofen (ADVIL,MOTRIN) 200 MG tablet Take 400 mg by mouth every 6 (six) hours as needed.      . Nutritional Supplements (ENSURE NUTRA SHAKE HI-CAL) LIQD Take 1 Can by mouth daily.  30 Can  3  . Prenatal Vit-Fe Fumarate-FA (PRENATAL MULTIVITAMIN) TABS Take 1 tablet by mouth daily.      . ranitidine (ZANTAC) 150 MG tablet Take 1 tablet (150 mg total) by mouth 2 (two) times daily.  60  tablet  3    @ROS @ Physical Exam  Moan softly, prefers to sit upright lungs clear bilaterally, AP RRR, tender to slightest touch or motion, abd soft no masses palable but difficult to assess, not tympanic bowel sounds active, abdomen tender increased pain at RLQ, uterus palpable 12 week size No adnexal masses palpable with increased discomfort to RLQ with exam, R knee with 3 cm size bruise, no edema to lower legs  {Blood pressure 137/85, pulse 61, temperature 97.7 F (36.5 C), resp. rate 18, unknown if currently breastfeeding. Voided UA with +blood   ED Course  A acute abdomen     6 days postpartum     lactating P cbc, cmp, amylase, lipase, ua cath, IV fluid bolus, strain urine, collaboration with Dr. Pennie Rushing per telephone at written at [phone]. Lavera Guise, CNM

## 2012-04-17 NOTE — Progress Notes (Addendum)
Pt is again asking for more pain medication.  Pt was able to sleep after previous Stadol dose.  Pt did incur n/v while drinking po contrast for CT scan, but none since then.  Pt's pain continues to be concentrated in RLQ, Rt flank and back area.  Pt's family now at bedside and inquiring about POC.  Infant has received formula and being cared for by family.  Rev'd CT scan result with pt and family r/e Rt obstructive renal calculi.  Dr. Su Hilt is awaiting return call from urology r/e POC, but will admit pt now for 23 hr observation to 3rd floor while awaiting consultation.  Will keep pt NPO and continue IVF.  Continue intake & output and straining urine.  CBC w/ diff in AM, and d/c'd stadol and will try Morphine sulfate prn.  Will help pt w/ lactation needs as well as needed.    Pt seen in MAU by me after CT Scan results returned and I called Urology for consultation.  Dr. Laverle Patter reviewed pts information in computer as well as CT Scan and he said that 50% of the time these stones pass on their own and that he recommended she be discharged on pain meds if they control her pain and she is afebrile and without persistent N/V.  We discussed that the pt was breastfeeding and he said for that reason may not be good candidate for medical mgmt.  He recommended she f/u with urology in a week and if it hasn't passed by then, treatment options will be pursued.  He also recommend she strain her urine and bring stone in when she comes to appt if she passes anything.  Upon seeing pt, she was afebrile and s/p stadol and phenergan with erythematous rash at side of face.  Decision was made to observe pt overnight and see which po meds would be best to send her home on.  Dr. Laverle Patter said his office would contact the pt within the next 24hrs with appt time.

## 2012-04-18 DIAGNOSIS — N200 Calculus of kidney: Secondary | ICD-10-CM | POA: Diagnosis present

## 2012-04-18 LAB — CBC WITH DIFFERENTIAL/PLATELET
Basophils Absolute: 0 10*3/uL (ref 0.0–0.1)
Basophils Relative: 0 % (ref 0–1)
HCT: 32.1 % — ABNORMAL LOW (ref 36.0–46.0)
Lymphocytes Relative: 24 % (ref 12–46)
MCHC: 31.5 g/dL (ref 30.0–36.0)
Monocytes Absolute: 1.1 10*3/uL — ABNORMAL HIGH (ref 0.1–1.0)
Neutro Abs: 6.6 10*3/uL (ref 1.7–7.7)
Neutrophils Relative %: 64 % (ref 43–77)
Platelets: 386 10*3/uL (ref 150–400)
RDW: 13 % (ref 11.5–15.5)
WBC: 10.2 10*3/uL (ref 4.0–10.5)

## 2012-04-18 MED ORDER — OXYCODONE-ACETAMINOPHEN 5-325 MG PO TABS: 1.0000 | ORAL_TABLET | ORAL | Status: AC | PRN

## 2012-04-18 MED ORDER — POTASSIUM CHLORIDE CRYS ER 20 MEQ PO TBCR
40.0000 meq | EXTENDED_RELEASE_TABLET | Freq: Two times a day (BID) | ORAL | Status: DC
  Administered 2012-04-18: 40 meq via ORAL
  Filled 2012-04-18 (×3): qty 2

## 2012-04-18 MED ORDER — PROMETHAZINE HCL 12.5 MG PO TABS: 25.0000 mg | ORAL_TABLET | Freq: Four times a day (QID) | ORAL | Status: DC | PRN

## 2012-04-18 MED ORDER — OXYCODONE-ACETAMINOPHEN 5-325 MG PO TABS: 1.0000 | ORAL_TABLET | ORAL | Status: DC | PRN

## 2012-04-18 NOTE — Progress Notes (Signed)
Pt awaiting discharge per Dr.Dillard I rewrote the RX for percocet and signed it to give to pt as per Dr Redmond Baseman orders. RX for percocet printed by Dr.Dillard was shredded, as it was not signed.  Pt is requesting medicine for nausea, I added Phenergan 25mg  PO q6h PRN for nausea e-scribe to pt's pharmacy.   S.Lyndel Dancel, CNM

## 2012-04-18 NOTE — Consult Note (Signed)
Mom is a re-admitt for kidney stones. She had used a breast pump and was dumping her milk dur to her taking morphine. She had some milk  From this morning she had pumped, which I told her was fine to keep since  She had not had Morphine since yesterday. I reviewed how to use the hand pump. Mom had easily expressed milk. She was being discharged to home. I gave her the lactation information, and encouraged her to call for any questions. I told her that is she is told to pump and dump again for any reason, to call lactation and check with  [place] first.

## 2012-04-18 NOTE — Discharge Summary (Signed)
Physician Discharge Summary  Patient ID: Marie Holt MRN: [id number] DOB/AGE: October 20, 1990 21 y.o.  Admit date: 04/17/2012 Discharge date: 04/18/2012  Admission Diagnoses: obstructing kidney stone  Discharge Diagnoses:  Principal Problem:  *Kidney stone Active Problems:  Kidney stone   Discharged Condition: stable  Hospital Course: pt came in complaining of severe back pain.  She was dxd with a obstructing kidney stone.  She received pain management and urology was consulted.  They want her to follow up in a week for an evaluation  Consults: urology  Significant Diagnostic Studies: radiology: CT scan: significant for a kidney stone  Treatments: IV hydration and analgesia: acetaminophen w/ codeine and Morphine  Discharge Exam: Blood pressure 101/63, pulse 64, temperature 98 F (36.7 C), temperature source Oral, resp. rate 16, height 5\' 2"  (1.575 m), weight 112 lb 2 oz (50.86 kg), SpO2 98.00%. Physical Examination: General appearance - alert, well appearing, and in no distress Abdomen - soft, nontender, nondistended, no masses or organomegaly Back exam - full range of motion, no tenderness, palpable spasm or pain on motion, positive CVATB Extremities - Homan's sign negative bilaterally   Disposition: 01-Home or Self Care  Discharge Orders    Future Appointments: Provider: Department: Dept Phone: Center:   05/08/2012 11:30 AM Lavera Guise, CNM [facility] [phone] None     Future Orders Please Complete By Expires   Diet general      Discharge instructions      Comments:   Call for vaginal bleeding if you soak greater than pad per hour   Increase activity slowly      May shower / Bathe      Sexual Activity Restrictions      Comments:   Pelvic rest.  Nothing in the vagina   Call MD for:  temperature >100.4      Call MD for:  severe uncontrolled pain      Call MD for:  persistant dizziness or light-headedness      Call MD for:  persistant nausea and vomiting      Call MD for:  redness, tenderness, or signs of infection (pain, swelling, redness, odor or green/yellow discharge around incision site)      Call MD for:  difficulty breathing, headache or visual disturbances      Call MD for:  hives      Call MD for:  extreme fatigue        Medication List  As of 04/18/2012 12:25 PM   TAKE these medications         albuterol 108 (90 BASE) MCG/ACT inhaler   Commonly known as: PROVENTIL HFA;VENTOLIN HFA   Inhale 2 puffs into the lungs every 6 (six) hours as needed. For shortness of breath      budesonide-formoterol 160-4.5 MCG/ACT inhaler   Commonly known as: SYMBICORT   Inhale 2 puffs into the lungs 2 (two) times daily.      Ensure Nutra Shake Hi-Cal Liqd   Take 1 Can by mouth daily.      ibuprofen 200 MG tablet   Commonly known as: ADVIL,MOTRIN   Take 400 mg by mouth every 6 (six) hours as needed.      oxyCODONE-acetaminophen 5-325 MG per tablet   Commonly known as: PERCOCET   Take 1-2 tablets by mouth every 4 (four) hours as needed for pain.      prenatal multivitamin Tabs   Take 1 tablet by mouth daily.  SignedJaymes Graff A 04/18/2012, 12:25 PM

## 2012-04-19 LAB — URINE CULTURE: Colony Count: NO GROWTH

## 2012-04-19 NOTE — Progress Notes (Signed)
Post discharge UR completed.

## 2012-05-01 NOTE — Progress Notes (Signed)
This encounter was created in error - please disregard.

## 2012-05-08 ENCOUNTER — Ambulatory Visit (INDEPENDENT_AMBULATORY_CARE_PROVIDER_SITE_OTHER): Payer: Medicaid Other | Admitting: Obstetrics and Gynecology

## 2012-05-08 VITALS — BP 90/60 | Resp 16 | Ht 62.0 in | Wt 110.5 lb

## 2012-05-11 NOTE — Progress Notes (Signed)
S: comfortable      Bleeding stopped     Breastfeeding has f/o for kidney stone removal in 2 weeks O VSS     abd soft, nontender     Diastasis recti 1 finger breath     Normal hair distrubition mons pubis,      EGBUS and perineum WNL,  vagina good vaginal tone cerix LTC, no cervical motion tenderness, No adnexal masses or tenderness uterus firm small Discharge with [place] +hyphae, neg clue, neg trich     A normal involution     Lactating     6 weeks PP P f/o up annual for PAP and prn    F/o up depo RX given 1 month had intercouse discussed risk of anomalies, tx for yeast discussed. Needs Rubella. Lavera Guise, CNM

## 2012-05-14 ENCOUNTER — Other Ambulatory Visit: Payer: Self-pay | Admitting: Urology

## 2012-05-15 ENCOUNTER — Other Ambulatory Visit (INDEPENDENT_AMBULATORY_CARE_PROVIDER_SITE_OTHER): Payer: Medicaid Other

## 2012-05-15 DIAGNOSIS — Z3009 Encounter for other general counseling and advice on contraception: Secondary | ICD-10-CM

## 2012-05-15 LAB — POCT URINE PREGNANCY: Preg Test, Ur: NEGATIVE

## 2012-05-15 MED ORDER — MEDROXYPROGESTERONE ACETATE 150 MG/ML IM SUSP
150.0000 mg | Freq: Once | INTRAMUSCULAR | Status: AC
  Administered 2012-05-15: 150 mg via INTRAMUSCULAR

## 2012-05-15 NOTE — Progress Notes (Unsigned)
Next Depo due-08-06-2012

## 2012-05-20 ENCOUNTER — Encounter: Payer: Medicaid Other | Admitting: Obstetrics and Gynecology

## 2012-05-28 ENCOUNTER — Encounter (HOSPITAL_BASED_OUTPATIENT_CLINIC_OR_DEPARTMENT_OTHER): Payer: Self-pay | Admitting: *Deleted

## 2012-05-28 ENCOUNTER — Encounter: Payer: Self-pay | Admitting: Obstetrics and Gynecology

## 2012-05-28 NOTE — Progress Notes (Signed)
NPO AFTER MN. ARRIVES AT 0730. NEEDS HG AND URINE PREG. 

## 2012-05-30 LAB — US OB FOLLOW UP

## 2012-05-30 NOTE — H&P (Signed)
presents with c/o of "pain x 3-4 hours, had pain like this a few days ago that hurt for a few hours, ate at 9 pm sausage biscuit"denies N,V,D, c/o of urinary frequency" just keeps feeling like I need to pee again, some constipation last bm 2 days ago" little vaginal bleeding, SVD uncomplicated 04/01/12 Breastfeeding, no intercouse since pregnancy, denies physical abuse.  Med: Ibuprofen 400 mg at 0330  symbacort as needed not used today  Chief Complaint   Patient presents with   .  Abdominal Pain   @SFHPI @  OB History    Grav  Para  Term  Preterm  Abortions  TAB  SAB  Ect  Mult  Living    1  1  1        1      04/01/2012 SVD uncomplicated  Past Medical History   Diagnosis  Date   .  Asthma     Past Surgical History   Procedure  Date   .  No past surgeries     Family History   Problem  Relation  Age of Onset   .  Anesthesia problems  Neg Hx    .  Von Willebrand disease  Sister    .  Hypertension  Maternal Grandmother    .  Cancer  Paternal Grandmother       unknown origin    .  Cancer  Paternal Grandfather       unknown origin    History   Substance Use Topics   .  Smoking status:  Never Smoker   .  Smokeless tobacco:  Never Used   .  Alcohol Use:  No   Allergies: No Known Allergies  Prescriptions prior to admission   Medication  Sig  Dispense  Refill   .  albuterol (PROVENTIL HFA;VENTOLIN HFA) 108 (90 BASE) MCG/ACT inhaler  Inhale 2 puffs into the lungs every 6 (six) hours as needed. For shortness of breath     .  budesonide-formoterol (SYMBICORT) 160-4.5 MCG/ACT inhaler  Inhale 2 puffs into the lungs 2 (two) times daily.     Marland Kitchen  ibuprofen (ADVIL,MOTRIN) 200 MG tablet  Take 400 mg by mouth every 6 (six) hours as needed.     .  Nutritional Supplements (ENSURE NUTRA SHAKE HI-CAL) LIQD  Take 1 Can by mouth daily.  30 Can  3   .  Prenatal Vit-Fe Fumarate-FA (PRENATAL MULTIVITAMIN) TABS  Take 1 tablet by mouth daily.     .  ranitidine (ZANTAC) 150 MG tablet  Take 1 tablet (150 mg  total) by mouth 2 (two) times daily.  60 tablet  3   @ROS @  Physical Exam   Moan softly, prefers to sit upright  lungs clear bilaterally, AP RRR, tender to slightest touch or motion, abd soft no masses palable but difficult to assess, not tympanic bowel sounds active, abdomen tender increased pain at RLQ, uterus palpable 12 week size  No adnexal masses palpable with increased discomfort to RLQ with exam, R knee with 3 cm size bruise, no edema to lower legs  {Blood pressure 137/85, pulse 61, temperature 97.7 F (36.5 C), resp. rate 18, unknown if currently breastfeeding.  Voided UA with +blood  ED Course   A acute abdomen  6 days postpartum  lactating  P cbc, cmp, amylase, lipase, ua cath, IV fluid bolus, strain urine, collaboration with Dr. Pennie Rushing per telephone at written at [phone].  Lavera Guise, CNM    Rn calls pt with  emesis green now, Intake 725 IV output 150 no sediment    Results for orders placed during the hospital encounter of 04/17/12 (from the past 24 hour(s))    URINALYSIS, ROUTINE W REFLEX MICROSCOPIC Status: Abnormal     Collection Time     04/17/12 4:00 AM    Component  Value  Range     Color, Urine  YELLOW  YELLOW     APPearance  CLEAR  CLEAR     Specific Gravity, Urine  1.025  1.005 - 1.030     pH  6.0  5.0 - 8.0     Glucose, UA  NEGATIVE  NEGATIVE mg/dL     Hgb urine dipstick  LARGE (*)  NEGATIVE     Bilirubin Urine  NEGATIVE  NEGATIVE     Ketones, ur  NEGATIVE  NEGATIVE mg/dL     Protein, ur  30 (*)  NEGATIVE mg/dL     Urobilinogen, UA  0.2  0.0 - 1.0 mg/dL     Nitrite  NEGATIVE  NEGATIVE     Leukocytes, UA  TRACE (*)  NEGATIVE    URINE MICROSCOPIC-ADD ON Status: Abnormal     Collection Time     04/17/12 4:00 AM    Component  Value  Range     Squamous Epithelial / LPF  FEW (*)  RARE     WBC, UA  3-6  <3 WBC/hpf     RBC / HPF  11-20  <3 RBC/hpf     Bacteria, UA  MANY (*)  RARE    URINALYSIS, ROUTINE W REFLEX MICROSCOPIC Status: Abnormal     Collection Time      04/17/12 4:43 AM    Component  Value  Range     Color, Urine  YELLOW  YELLOW     APPearance  CLEAR  CLEAR     Specific Gravity, Urine  >1.030 (*)  1.005 - 1.030     pH  6.0  5.0 - 8.0     Glucose, UA  NEGATIVE  NEGATIVE mg/dL     Hgb urine dipstick  NEGATIVE  NEGATIVE     Bilirubin Urine  NEGATIVE  NEGATIVE     Ketones, ur  NEGATIVE  NEGATIVE mg/dL     Protein, ur  30 (*)  NEGATIVE mg/dL     Urobilinogen, UA  0.2  0.0 - 1.0 mg/dL     Nitrite  NEGATIVE  NEGATIVE     Leukocytes, UA  NEGATIVE  NEGATIVE    URINE MICROSCOPIC-ADD ON Status: Abnormal     Collection Time     04/17/12 4:43 AM    Component  Value  Range     Squamous Epithelial / LPF  RARE  RARE     WBC, UA  3-6  <3 WBC/hpf     RBC / HPF  7-10  <3 RBC/hpf     Bacteria, UA  FEW (*)  RARE    CBC WITH DIFFERENTIAL Status: Abnormal     Collection Time     04/17/12 4:53 AM    Component  Value  Range     WBC  14.3 (*)  4.0 - 10.5 K/uL     RBC  4.05  3.87 - 5.11 MIL/uL     Hemoglobin  12.1  12.0 - 15.0 g/dL     HCT  16.1  09.6 - 04.5 %     MCV  94.1  78.0 - 100.0 fL     MCH  29.9  26.0 - 34.0 pg     MCHC  31.8  30.0 - 36.0 g/dL     RDW  16.1  09.6 - 04.5 %     Platelets  459 (*)  150 - 400 K/uL     Neutrophils Relative  70  43 - 77 %     Neutro Abs  9.9 (*)  1.7 - 7.7 K/uL     Lymphocytes Relative  22  12 - 46 %     Lymphs Abs  3.2  0.7 - 4.0 K/uL     Monocytes Relative  7  3 - 12 %     Monocytes Absolute  1.0  0.1 - 1.0 K/uL     Eosinophils Relative  1  0 - 5 %     Eosinophils Absolute  0.1  0.0 - 0.7 K/uL     Basophils Relative  0  0 - 1 %     Basophils Absolute  0.0  0.0 - 0.1 K/uL    COMPREHENSIVE METABOLIC PANEL Status: Abnormal     Collection Time     04/17/12 4:53 AM    Component  Value  Range     Sodium  142  135 - 145 mEq/L     Potassium  3.1 (*)  3.5 - 5.1 mEq/L     Chloride  103  96 - 112 mEq/L     CO2  27  19 - 32 mEq/L     Glucose, Bld  98  70 - 99 mg/dL     BUN  7  6 - 23 mg/dL     Creatinine, Ser   4.09  0.50 - 1.10 mg/dL     Calcium  9.8  8.4 - 10.5 mg/dL     Total Protein  7.0  6.0 - 8.3 g/dL     Albumin  3.3 (*)  3.5 - 5.2 g/dL     AST  16  0 - 37 U/L     ALT  12  0 - 35 U/L     Alkaline Phosphatase  131 (*)  39 - 117 U/L     Total Bilirubin  0.2 (*)  0.3 - 1.2 mg/dL     GFR calc non Af Amer  >90  >90 mL/min     GFR calc Af Amer  >90  >90 mL/min    AMYLASE Status: Normal     Collection Time     04/17/12 4:53 AM    Component  Value  Range     Amylase  50  0 - 105 U/L    LIPASE, BLOOD Status: Normal     Collection Time     04/17/12 4:53 AM    Component  Value  Range     Lipase  29  11 - 59 U/L    A RLQ pain  P CT abd pelvis without contrast RLQ pain, r/o appy, r/o kidney stone, IV stadol, phenergan, collaboration with Dr. Pennie Rushing per telephone at [phone], with review of labs, progress note.  Lavera Guise, CNM       Pt is again asking for more pain medication. Pt was able to sleep after previous Stadol dose. Pt did incur n/v while drinking po contrast for CT scan, but none since then. Pt's pain continues to be concentrated in RLQ, Rt flank and back area. Pt's family now at bedside and inquiring about POC. Infant has received formula and being cared for by family. Rev'd CT  scan result with pt and family r/e Rt obstructive renal calculi. Dr. Su Hilt is awaiting return call from urology r/e POC, but will admit pt now for 23 hr observation to 3rd floor while awaiting consultation. Will keep pt NPO and continue IVF. Continue intake & output and straining urine. CBC w/ diff in AM, and d/c'd stadol and will try Morphine sulfate prn. Will help pt w/ lactation needs as well as needed.  Pt seen in MAU by me after CT Scan results returned and I called Urology for consultation. Dr. Laverle Patter reviewed pts information in computer as well as CT Scan and he said that 50% of the time these stones pass on their own and that he recommended she be discharged on pain meds if they control her pain and she is  afebrile and without persistent N/V. We discussed that the pt was breastfeeding and he said for that reason may not be good candidate for medical mgmt. He recommended she f/u with urology in a week and if it hasn't passed by then, treatment options will be pursued. He also recommend she strain her urine and bring stone in when she comes to appt if she passes anything. Upon seeing pt, she was afebrile and s/p stadol and phenergan with erythematous rash at side of face. Decision was made to observe pt overnight and see which po meds would be best to send her home on. Dr. Laverle Patter said his office would contact the pt within the next 24hrs with appt time.

## 2012-06-02 NOTE — H&P (Signed)
  Marie Holt is an 21 y.o. female.   Chief Complaint:right urteral calculus HPI: History of Present Illness   Referral from the [facility] for a recently diagnosed 5 mm ureteral calculus. The patient is 4-5 weeks post partum.  She presented recently with some right lower quadrant abdominal pain.  She apparently was noted to have microhematuria at that time.  CT scan was performed which showed a tiny nonobstructing 1-2 mm stone in her right lower pole.  The patient also had a significant right-sided hydronephrosis down to a 5 mm stone at her ureterovesical junction.  The left side appeared unremarkable.  She was not given an alpha-blocker.  The patient has not had any other systemic issues.  She has not had any obvious fever.  She has had some increased bladder irritability.  This is her first clinical stone event.  Probable positive family history.  Patient appears to have had this distal stone now for proximally 6 weeks. Issue was seen recently by one of the physician extenders in our office and imaging at that time revealed the stone still to be present. Surgical intervention was discussed with her and was recommended that given the lack of spontaneous passage intervention needs to be considered. Risks benefits and procedure discussed with her.       Past Medical History  Diagnosis Date  . Asthma   . Right ureteral stone     History reviewed. No pertinent past surgical history.  Family History  Problem Relation Age of Onset  . Anesthesia problems Neg Hx   . Von Willebrand disease Sister   . Hypertension Maternal Grandmother   . Cancer Paternal Grandmother     unknown origin  . Cancer Paternal Grandfather     unknown origin   Social History:  reports that she has never smoked. She has never used smokeless tobacco. She reports that she uses illicit drugs (Marijuana). She reports that she does not drink alcohol.  Allergies: No Known Allergies  No prescriptions prior  to admission    No results found for this or any previous visit (from the past 48 hour(s)). No results found.  Review of Systems - Negative except mild abdominal discomfort and mild irritative voiding symptoms.  Height 5\' 2"  (1.575 m), weight 49.896 kg (110 lb), last menstrual period 05/28/2012, not currently breastfeeding. General appearance: alert, cooperative and no distress Neck: no adenopathy and no JVD Resp: clear to auscultation bilaterally Cardio: regular rate and rhythm GI: soft, non-tender; bowel sounds normal; no masses,  no organomegaly Extremities: extremities normal, atraumatic, no cyanosis or edema Skin: Skin color, texture, turgor normal. No rashes or lesions Neurologic: Grossly normal  Assessment/Plan 5 mm right distal ureteral calculus. This was diagnosed on July 10. Patient is now here for definitive management of the stone. She will undergo outpatient cystoscopy with right-sided retrograde pyelography probable ureteroscopy with stone basketing and/or holmium laser lithotripsy. Double-J stent placement may be required.  Nasser Ku S 06/02/2012, 6:39 PM

## 2012-06-03 ENCOUNTER — Encounter (HOSPITAL_BASED_OUTPATIENT_CLINIC_OR_DEPARTMENT_OTHER): Payer: Self-pay | Admitting: *Deleted

## 2012-06-03 ENCOUNTER — Ambulatory Visit (HOSPITAL_BASED_OUTPATIENT_CLINIC_OR_DEPARTMENT_OTHER): Payer: Medicaid Other | Admitting: Anesthesiology

## 2012-06-03 ENCOUNTER — Encounter (HOSPITAL_BASED_OUTPATIENT_CLINIC_OR_DEPARTMENT_OTHER)
Admission: RE | Disposition: A | Discharge status: Home / Self Care | Payer: Self-pay | Source: Ambulatory Visit | Attending: Urology

## 2012-06-03 ENCOUNTER — Ambulatory Visit (HOSPITAL_BASED_OUTPATIENT_CLINIC_OR_DEPARTMENT_OTHER)
Admission: RE | Admit: 2012-06-03 | Discharge: 2012-06-03 | Disposition: A | Discharge status: Home / Self Care | Payer: Medicaid Other | Source: Ambulatory Visit | Attending: Urology | Admitting: Urology

## 2012-06-03 ENCOUNTER — Encounter (HOSPITAL_BASED_OUTPATIENT_CLINIC_OR_DEPARTMENT_OTHER): Payer: Self-pay | Admitting: Anesthesiology

## 2012-06-03 DIAGNOSIS — N201 Calculus of ureter: Secondary | ICD-10-CM | POA: Insufficient documentation

## 2012-06-03 HISTORY — DX: Calculus of ureter: N20.1

## 2012-06-03 HISTORY — PX: CYSTOSCOPY W/ RETROGRADES: SHX1426

## 2012-06-03 LAB — POCT HEMOGLOBIN-HEMACUE: Hemoglobin: 12.5 g/dL (ref 12.0–15.0)

## 2012-06-03 SURGERY — CYSTOSCOPY, WITH RETROGRADE PYELOGRAM
Anesthesia: General | Site: Ureter | Laterality: Right | Wound class: Clean Contaminated

## 2012-06-03 MED ORDER — ONDANSETRON HCL 4 MG/2ML IJ SOLN
INTRAMUSCULAR | Status: DC | PRN
  Administered 2012-06-03: 4 mg via INTRAVENOUS

## 2012-06-03 MED ORDER — MIDAZOLAM HCL 5 MG/5ML IJ SOLN
INTRAMUSCULAR | Status: DC | PRN
  Administered 2012-06-03: 2 mg via INTRAVENOUS

## 2012-06-03 MED ORDER — SODIUM CHLORIDE 0.9 % IR SOLN
Status: DC | PRN
  Administered 2012-06-03: 3000 mL

## 2012-06-03 MED ORDER — DEXAMETHASONE SODIUM PHOSPHATE 4 MG/ML IJ SOLN
INTRAMUSCULAR | Status: DC | PRN
  Administered 2012-06-03: 10 mg via INTRAVENOUS

## 2012-06-03 MED ORDER — LACTATED RINGERS IV SOLN
INTRAVENOUS | Status: DC
Start: 2012-06-03 — End: 2012-06-03
  Administered 2012-06-03: 100 mL/h via INTRAVENOUS

## 2012-06-03 MED ORDER — FENTANYL CITRATE 0.05 MG/ML IJ SOLN
INTRAMUSCULAR | Status: DC | PRN
  Administered 2012-06-03: 50 ug via INTRAVENOUS

## 2012-06-03 MED ORDER — LIDOCAINE HCL 2 % EX GEL
CUTANEOUS | Status: DC | PRN
  Administered 2012-06-03: 1 via URETHRAL

## 2012-06-03 MED ORDER — FENTANYL CITRATE 0.05 MG/ML IJ SOLN: 25.0000 ug | INTRAMUSCULAR | Status: DC | PRN

## 2012-06-03 MED ORDER — LIDOCAINE HCL (CARDIAC) 20 MG/ML IV SOLN
INTRAVENOUS | Status: DC | PRN
  Administered 2012-06-03: 50 mg via INTRAVENOUS

## 2012-06-03 MED ORDER — PROMETHAZINE HCL 25 MG/ML IJ SOLN: 6.2500 mg | INTRAMUSCULAR | Status: DC | PRN

## 2012-06-03 MED ORDER — LACTATED RINGERS IV SOLN: INTRAVENOUS | Status: DC

## 2012-06-03 MED ORDER — CIPROFLOXACIN IN D5W 400 MG/200ML IV SOLN
400.0000 mg | INTRAVENOUS | Status: AC
  Administered 2012-06-03: 400 mg via INTRAVENOUS

## 2012-06-03 MED ORDER — FENTANYL CITRATE 0.05 MG/ML IJ SOLN: INTRAMUSCULAR | Status: DC | PRN

## 2012-06-03 MED ORDER — PROPOFOL 10 MG/ML IV EMUL
INTRAVENOUS | Status: DC | PRN
  Administered 2012-06-03: 160 mg via INTRAVENOUS

## 2012-06-03 MED ORDER — IOHEXOL 350 MG/ML SOLN
INTRAVENOUS | Status: DC | PRN
  Administered 2012-06-03: 5 mL via INTRAVENOUS

## 2012-06-03 MED ORDER — KETOROLAC TROMETHAMINE 30 MG/ML IJ SOLN
30.0000 mg | Freq: Four times a day (QID) | INTRAMUSCULAR | Status: DC | PRN
  Administered 2012-06-03: 30 mg via INTRAVENOUS

## 2012-06-03 SURGICAL SUPPLY — 38 items
ADAPTER CATH URET PLST 4-6FR (CATHETERS) ×2 IMPLANT
ADPR CATH URET STRL DISP 4-6FR (CATHETERS) ×2
APL SKNCLS STERI-STRIP NONHPOA (GAUZE/BANDAGES/DRESSINGS)
BAG DRAIN URO-CYSTO SKYTR STRL (DRAIN) ×3 IMPLANT
BAG DRN UROCATH (DRAIN) ×2
BASKET LASER NITINOL 1.9FR (BASKET) IMPLANT
BASKET STNLS GEMINI 4WIRE 3FR (BASKET) IMPLANT
BASKET ZERO TIP NITINOL 2.4FR (BASKET) ×1 IMPLANT
BENZOIN TINCTURE PRP APPL 2/3 (GAUZE/BANDAGES/DRESSINGS) IMPLANT
BSKT STON RTRVL 120 1.9FR (BASKET)
BSKT STON RTRVL GEM 120X11 3FR (BASKET)
BSKT STON RTRVL ZERO TP 2.4FR (BASKET)
CANISTER SUCT LVC 12 LTR MEDI- (MISCELLANEOUS) ×2 IMPLANT
CATH INTERMIT  6FR 70CM (CATHETERS) ×2 IMPLANT
CATH URET 5FR 28IN CONE TIP (BALLOONS)
CATH URET 5FR 28IN OPEN ENDED (CATHETERS) IMPLANT
CATH URET 5FR 70CM CONE TIP (BALLOONS) IMPLANT
CLOTH BEACON ORANGE TIMEOUT ST (SAFETY) ×3 IMPLANT
DRAPE CAMERA CLOSED 9X96 (DRAPES) ×3 IMPLANT
DRSG TEGADERM 2-3/8X2-3/4 SM (GAUZE/BANDAGES/DRESSINGS) IMPLANT
GLOVE BIO SURGEON STRL SZ7.5 (GLOVE) ×3 IMPLANT
GLOVE ECLIPSE 6.0 STRL STRAW (GLOVE) ×2 IMPLANT
GLOVE INDICATOR 6.5 STRL GRN (GLOVE) ×2 IMPLANT
GOWN STRL REIN XL XLG (GOWN DISPOSABLE) ×3 IMPLANT
GUIDEWIRE 0.038 PTFE COATED (WIRE) IMPLANT
GUIDEWIRE ANG ZIPWIRE 038X150 (WIRE) IMPLANT
GUIDEWIRE STR DUAL SENSOR (WIRE) ×2 IMPLANT
IV NS IRRIG 3000ML ARTHROMATIC (IV SOLUTION) ×6 IMPLANT
KIT BALLIN UROMAX 15FX10 (LABEL) IMPLANT
KIT BALLN UROMAX 15FX4 (MISCELLANEOUS) IMPLANT
KIT BALLN UROMAX 26 75X4 (MISCELLANEOUS)
LASER FIBER DISP (UROLOGICAL SUPPLIES) IMPLANT
LASER FIBER DISP 1000U (UROLOGICAL SUPPLIES) IMPLANT
NS IRRIG 500ML POUR BTL (IV SOLUTION) IMPLANT
PACK CYSTOSCOPY (CUSTOM PROCEDURE TRAY) ×3 IMPLANT
SET HIGH PRES BAL DIL (LABEL)
SHEATH ACCESS URETERAL 38CM (SHEATH) IMPLANT
SHEATH ACCESS URETERAL 54CM (SHEATH) IMPLANT

## 2012-06-03 NOTE — Anesthesia Preprocedure Evaluation (Signed)
Anesthesia Evaluation  Patient identified by MRN, date of birth, ID band Patient awake    Reviewed: Allergy & Precautions, H&P , NPO status , Patient's Chart, lab work & pertinent test results, reviewed documented beta blocker date and time   Airway Mallampati: II TM Distance: >3 FB Neck ROM: full    Dental No notable dental hx. (+) Teeth Intact and Dental Advidsory Given   Pulmonary asthma ,  breath sounds clear to auscultation  Pulmonary exam normal       Cardiovascular Exercise Tolerance: Good negative cardio ROS  Rhythm:regular Rate:Normal     Neuro/Psych negative neurological ROS  negative psych ROS   GI/Hepatic negative GI ROS, Neg liver ROS,   Endo/Other  negative endocrine ROS  Renal/GU negative Renal ROS  negative genitourinary   Musculoskeletal   Abdominal   Peds  Hematology negative hematology ROS (+)   Anesthesia Other Findings   Reproductive/Obstetrics negative OB ROS                           Anesthesia Physical Anesthesia Plan  ASA: II  Anesthesia Plan: General and General LMA   Post-op Pain Management:    Induction:   Airway Management Planned:   Additional Equipment:   Intra-op Plan:   Post-operative Plan:   Informed Consent: I have reviewed the patients History and Physical, chart, labs and discussed the procedure including the risks, benefits and alternatives for the proposed anesthesia with the patient or authorized representative who has indicated his/her understanding and acceptance.   Dental Advisory Given  Plan Discussed with: CRNA  Anesthesia Plan Comments:         Anesthesia Quick Evaluation

## 2012-06-03 NOTE — Anesthesia Postprocedure Evaluation (Signed)
Anesthesia Post Note  Patient: Marie Holt  Procedure(s) Performed: Procedure(s) (LRB): CYSTOSCOPY WITH RETROGRADE PYELOGRAM (Right)  Anesthesia type: General  Patient location: PACU  Post pain: Pain level controlled  Post assessment: Post-op Vital signs reviewed  Last Vitals:  Filed Vitals:   06/03/12 0900  BP: 104/67  Pulse: 58  Temp:   Resp: 14    Post vital signs: Reviewed  Level of consciousness: sedated  Complications: No apparent anesthesia complications

## 2012-06-03 NOTE — Anesthesia Procedure Notes (Signed)
Procedure Name: LMA Insertion Date/Time: 06/03/2012 8:29 AM Performed by: Maris Berger T Pre-anesthesia Checklist: Patient identified, Emergency Drugs available, Suction available and Patient being monitored Patient Re-evaluated:Patient Re-evaluated prior to inductionOxygen Delivery Method: Circle System Utilized Preoxygenation: Pre-oxygenation with 100% oxygen Intubation Type: IV induction Ventilation: Mask ventilation without difficulty LMA: LMA inserted LMA Size: 4.0 Number of attempts: 1 Placement Confirmation: positive ETCO2 Dental Injury: Teeth and Oropharynx as per pre-operative assessment  Comments: Gauze roll between teeth

## 2012-06-03 NOTE — Transfer of Care (Signed)
Immediate Anesthesia Transfer of Care Note  Patient: Marie Holt  Procedure(s) Performed: Procedure(s) (LRB): CYSTOSCOPY WITH RETROGRADE PYELOGRAM (Right)  Patient Location: PACU  Anesthesia Type: General  Level of Consciousness: sedated and responds to stimulation  Airway & Oxygen Therapy: Patient Spontanous Breathing and Patient connected to nasal cannula oxygen  Post-op Assessment: Report given to PACU RN  Post vital signs: Reviewed and stable  Complications: No apparent anesthesia complications

## 2012-06-03 NOTE — Op Note (Signed)
Preoperative diagnosis: 5 mm right distal ureteral calculus Postoperative diagnosis: No stone seen  Procedure: Cystoscopy, right retrograde pyelogram   Surgeon: Valetta Fuller M.D.  Anesthesia: Gen.  Indications: Patient had presented from the [facility] emergency room with a 5 mm distal ureteral stone. Patient was initially encouraged pass the stone spontaneously. On followup 2 weeks later the patient's stone was still present. She was scheduled at that time for a ureteroscopic extraction. The patient had not noticed spontaneous passage of the stone.     Technique and findings: Patient was brought the operating room where she had successful induction general anesthesia. She was placed in lithotomy position prepped and draped in usual manner. Appropriate surgical timeout was performed. Cystoscopy was unremarkable. Retrograde pyelogram on the right side showed a open ureteral orifice area no obvious filling defects or evidence of obstruction was appreciated. The stone that had been easy to visualize did not appear to still be present and had probably passed spontaneously since her last visit. She was brought to recovery room stable condition having had no obvious problems or complications.

## 2012-06-03 NOTE — Progress Notes (Signed)
Dr Isabel Caprice notified of pt's c/o lower mid abd pain described as throbbing & aching, rated "7".

## 2012-06-04 ENCOUNTER — Encounter (HOSPITAL_BASED_OUTPATIENT_CLINIC_OR_DEPARTMENT_OTHER): Payer: Self-pay | Admitting: Urology

## 2012-06-14 ENCOUNTER — Encounter: Payer: Medicaid Other | Admitting: Obstetrics and Gynecology

## 2012-08-24 IMAGING — US US OB TRANSVAGINAL
1 series · 14 of 26 positions shown · non-contrast
Comparison: none

[Series 1: us ob transvaginal · 14 of 26 slices shown]
[im 1/26]
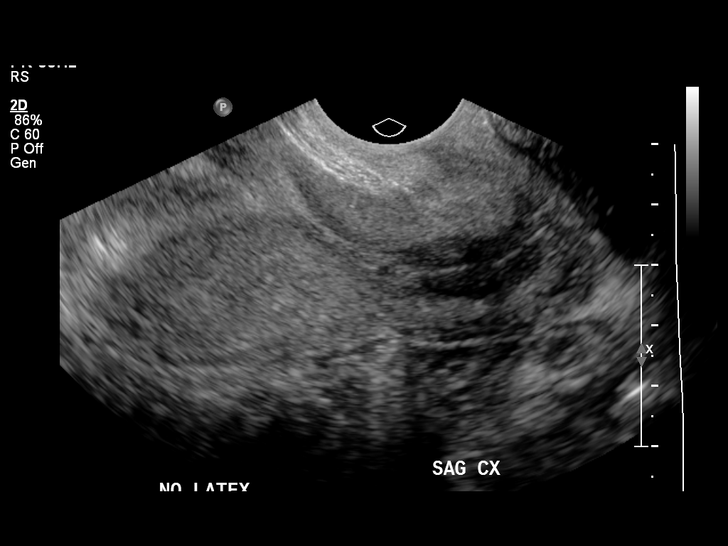
[im 3/26]
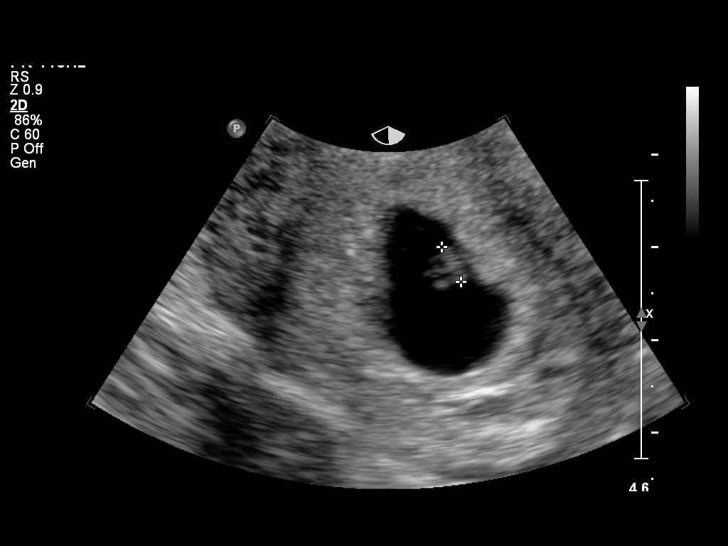
[im 5/26]
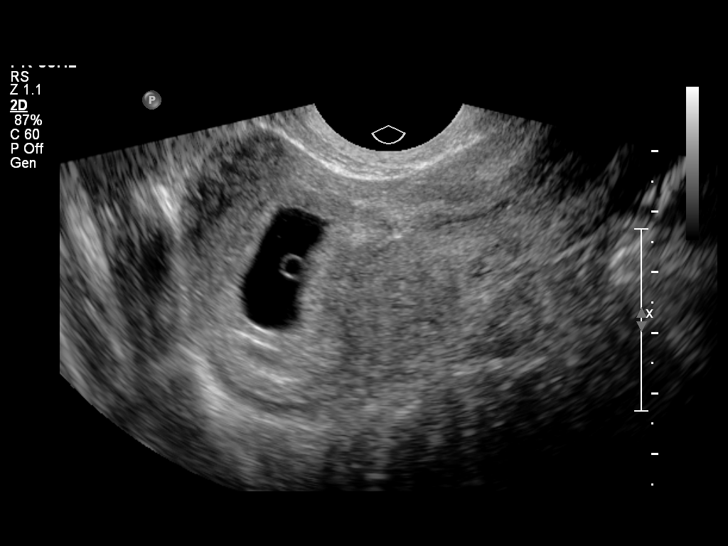
[im 7/26]
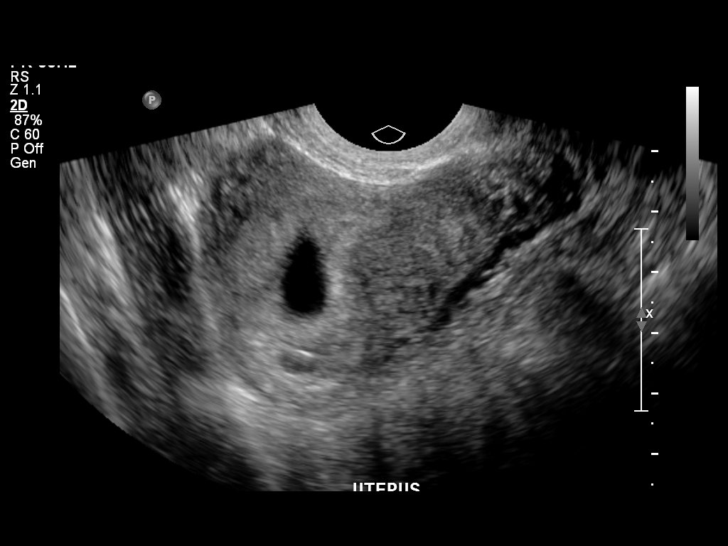
[im 9/26]
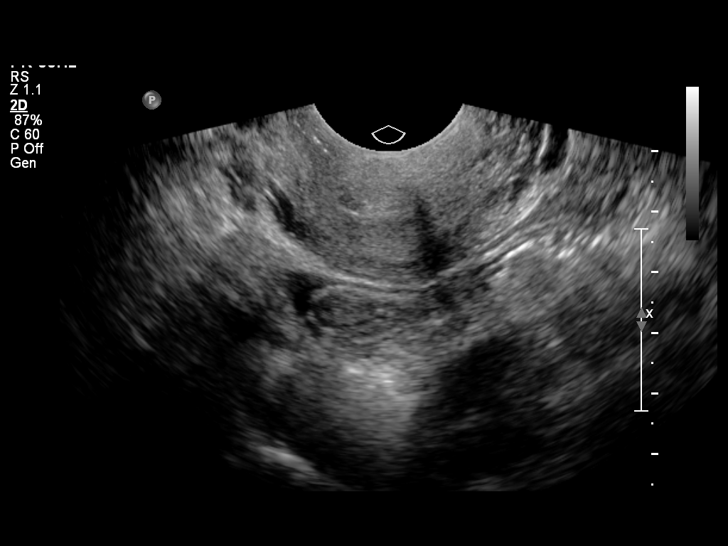
[im 11/26]
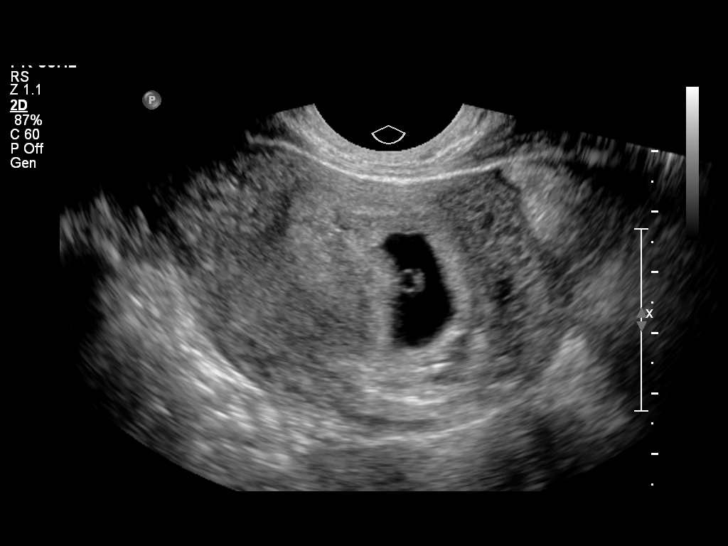
[im 13/26]
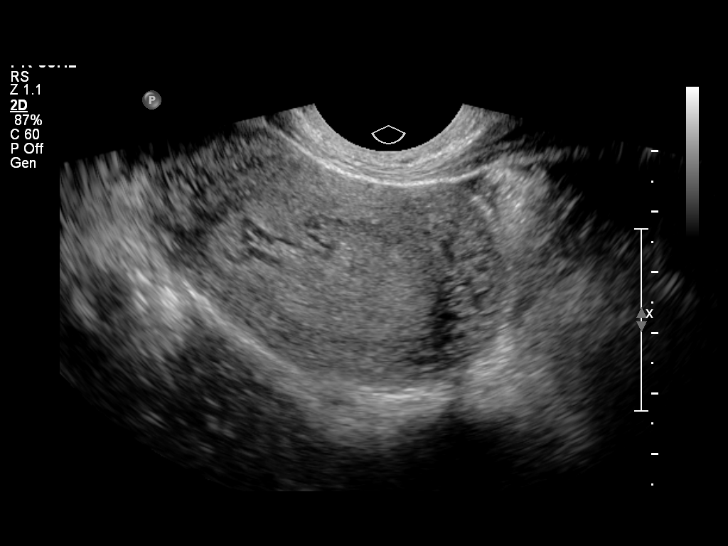
[im 14/26]
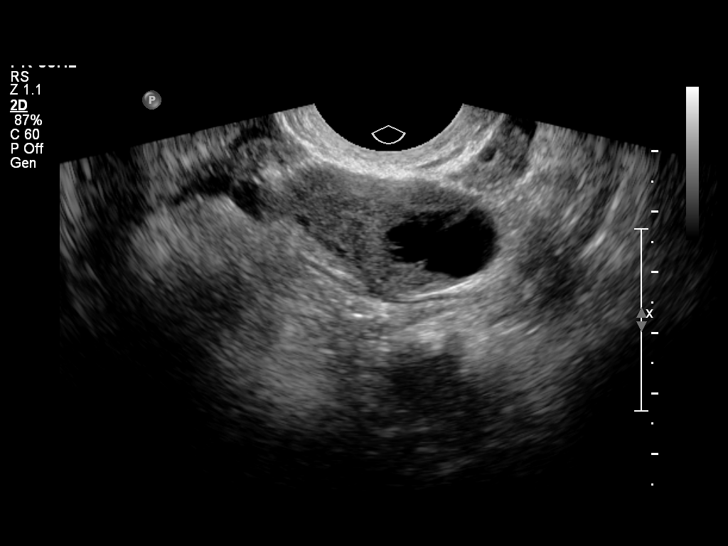
[im 16/26]
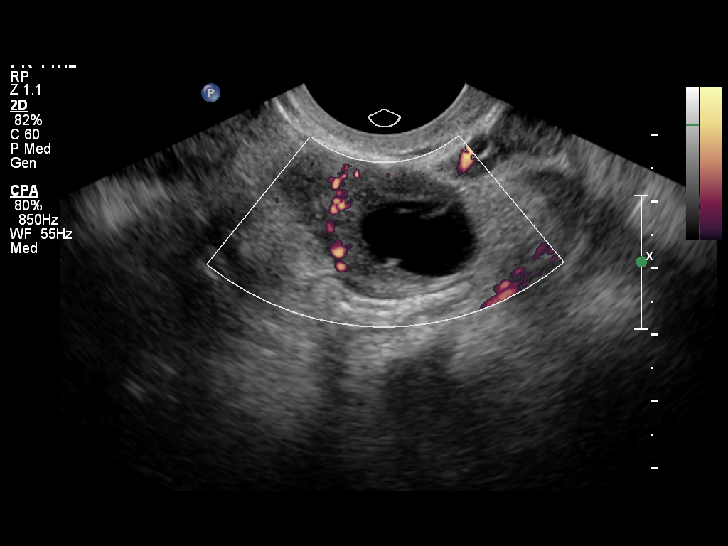
[im 18/26]
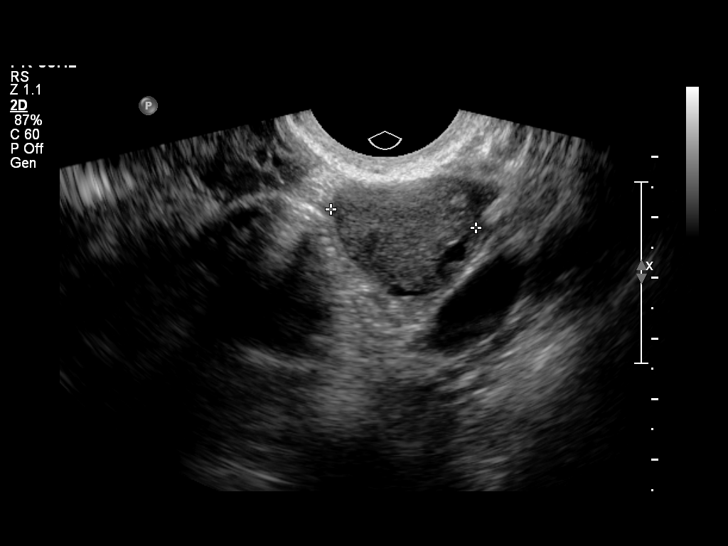
[im 20/26]
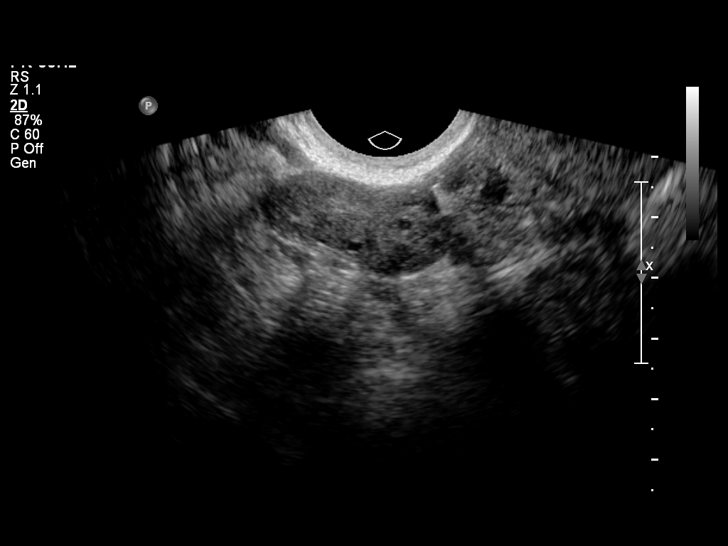
[im 22/26]
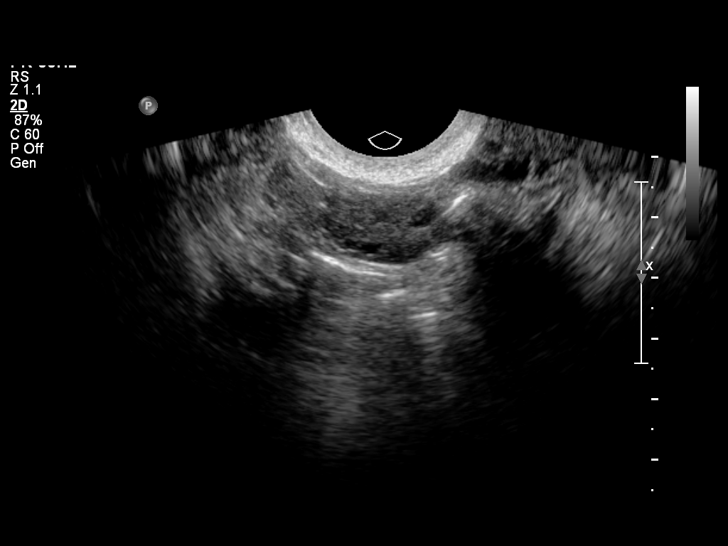
[im 24/26]
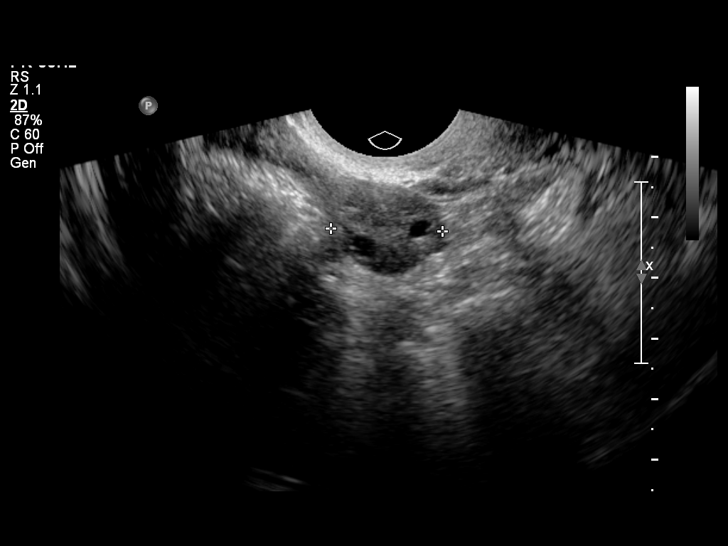
[im 26/26]
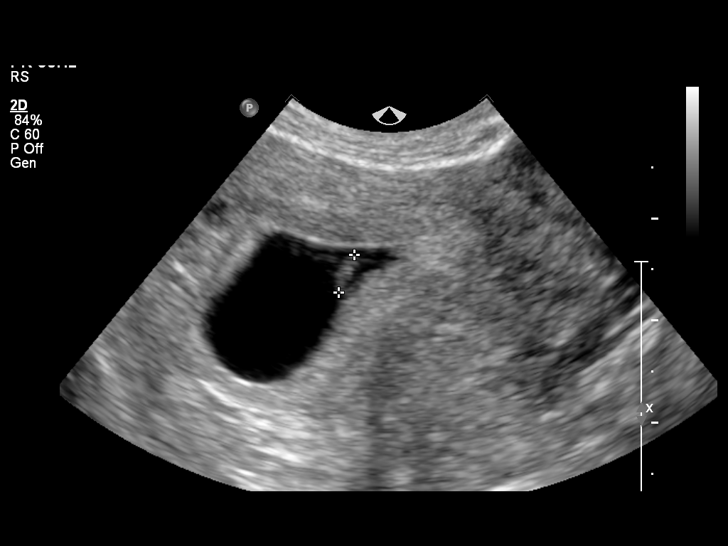

[14 of 26 positions shown; findings below may reference images not displayed]

OBSTETRICS REPORT
                      (Signed Final 08/16/2011 [DATE])

Procedures

 US OB TRANSVAGINAL                                    76817.0
Indications

 Pain - Abdominal/Pelvic
 F/u viability
 Unsure of LMP;  Establish Gestational [AGE]
Fetal Evaluation

 Preg. Location:    Intrauterine
 Gest. Sac:         Intrauterine
 Yolk Sac:          Visualized
 Fetal Pole:        Visualized
 Fetal Heart Rate:  110                          bpm
 Cardiac Activity:  Observed

 Amniotic Fluid
 AFI FV:      Subjectively within normal limits
Biometry

 CRL:       4.2 mm     G. Age:  6w 1d                   EDD:   04/09/12
Gestational Age

 Best:          6w 1d      Det. By:  U/S C R L (08/16/11)     EDD:   04/09/12
Cervix Uterus Adnexa

 Cervix:       Closed.

 Left Ovary:    Small corpus luteum noted
 Right Ovary:   Within normal limits.

 Adnexa:     No abnormality visualized.
Impression

 Single living IUP with US Gest. Age of 6w 1d, and EDD of
 04/09/12.  Appropriate progression since prior exam.
 No significant maternal uterine or adnexal abnormality
 identified.
 questions or concerns.

## 2012-10-23 ENCOUNTER — Inpatient Hospital Stay (HOSPITAL_COMMUNITY)
Admission: AD | Admit: 2012-10-23 | Discharge: 2012-10-23 | Disposition: A | Discharge status: Home / Self Care | Payer: Self-pay | Source: Ambulatory Visit | Attending: Obstetrics & Gynecology | Admitting: Obstetrics & Gynecology

## 2012-10-23 ENCOUNTER — Encounter (HOSPITAL_COMMUNITY): Payer: Self-pay

## 2012-10-23 DIAGNOSIS — N750 Cyst of Bartholin's gland: Secondary | ICD-10-CM | POA: Insufficient documentation

## 2012-10-23 DIAGNOSIS — N949 Unspecified condition associated with female genital organs and menstrual cycle: Secondary | ICD-10-CM | POA: Insufficient documentation

## 2012-10-23 DIAGNOSIS — O2390 Unspecified genitourinary tract infection in pregnancy, unspecified trimester: Secondary | ICD-10-CM | POA: Insufficient documentation

## 2012-10-23 HISTORY — DX: Anxiety disorder, unspecified: F41.9

## 2012-10-23 MED ORDER — SULFAMETHOXAZOLE-TMP DS 800-160 MG PO TABS: 1.0000 | ORAL_TABLET | Freq: Two times a day (BID) | ORAL | Status: DC

## 2012-10-23 NOTE — MAU Provider Note (Signed)
History     CSN: [id number]  Arrival date and time: 10/23/12 1215   First Provider Initiated Contact with Patient 10/23/12 1525      Chief Complaint  Patient presents with  . something is hanging out of vagina    HPI 22 y.o. G1P1001 with "skin" protruding from vagina, no pain, no bleeding. 6.5 mo postpartum    Past Medical History  Diagnosis Date  . Asthma   . Right ureteral stone   . Anxiety     Past Surgical History  Procedure Date  . Cystoscopy w/ retrogrades 06/03/2012    Procedure: CYSTOSCOPY WITH RETROGRADE PYELOGRAM;  Surgeon: Valetta Fuller, MD;  Location: [facility];  Service: Urology;  Laterality: Right;    Family History  Problem Relation Age of Onset  . Anesthesia problems Neg Hx   . Von Willebrand disease Sister   . Hypertension Maternal Grandmother   . Cancer Paternal Grandmother     unknown origin  . Cancer Paternal Grandfather     unknown origin    History  Substance Use Topics  . Smoking status: Never Smoker   . Smokeless tobacco: Never Used  . Alcohol Use: Yes     Comment: occasional    Allergies:  Allergies  Allergen Reactions  . Vicodin (Hydrocodone-Acetaminophen) Nausea And Vomiting    Prescriptions prior to admission  Medication Sig Dispense Refill  . budesonide-formoterol (SYMBICORT) 160-4.5 MCG/ACT inhaler Inhale 2 puffs into the lungs as needed. SOB      . albuterol (PROVENTIL HFA;VENTOLIN HFA) 108 (90 BASE) MCG/ACT inhaler Inhale 2 puffs into the lungs every 6 (six) hours as needed. For shortness of breath        Review of Systems  Constitutional: Negative.   Respiratory: Negative.   Cardiovascular: Negative.   Gastrointestinal: Negative for nausea, vomiting, abdominal pain, diarrhea and constipation.  Genitourinary: Negative for dysuria, urgency, frequency, hematuria and flank pain.       Negative for vaginal bleeding, vaginal discharge, dyspareunia  Musculoskeletal: Negative.   Neurological: Negative.     Psychiatric/Behavioral: Negative.    Physical Exam   Blood pressure 107/82, pulse 69, temperature 98.2 F (36.8 C), temperature source Oral, resp. rate 18, height 5\' 2"  (1.575 m), weight 118 lb (53.524 kg), last menstrual period 10/18/2012, not currently breastfeeding.  Physical Exam  Nursing note and vitals reviewed. Constitutional: She is oriented to person, place, and time. She appears well-developed and well-nourished. No distress.  Cardiovascular: Normal rate.   Respiratory: Effort normal.  GI: Soft. There is no tenderness.  Genitourinary:    No bleeding around the vagina.  Musculoskeletal: Normal range of motion.  Neurological: She is alert and oriented to person, place, and time.  Skin: Skin is warm.  Psychiatric: She has a normal mood and affect.    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 Torn at delivery, small flap of skin noted at other side of introitus as well - likely protruding more now because of bartholin's cyst. Cyst is small and deep, does not appear ready to I&D at this point. Rec. Sitz baths, f/u in clinic.    Assessment and Plan   1. Bartholin's cyst       Medication List     As of 10/23/2012  3:42 PM    START taking these medications         sulfamethoxazole-trimethoprim 800-160 MG per tablet   Commonly known as: BACTRIM DS   Take 1  tablet by mouth 2 (two) times daily.      CONTINUE taking these medications         albuterol 108 (90 BASE) MCG/ACT inhaler   Commonly known as: PROVENTIL HFA;VENTOLIN HFA      budesonide-formoterol 160-4.5 MCG/ACT inhaler   Commonly known as: SYMBICORT          Where to get your medications    These are the prescriptions that you need to pick up. We sent them to a specific pharmacy, so you will need to go there to get them.   [facility] [address], [place] - [address]    [address] [place] [address]    Phone: [phone]         sulfamethoxazole-trimethoprim 800-160 MG per tablet            Follow-up Information    Follow up with [facility]. In 2 weeks. (someone will call to schedule)    Contact information:   [address] [place] [address] [phone]           [facility] 10/23/2012, 3:33 PM

## 2012-10-23 NOTE — MAU Note (Signed)
Looked at her stuff and something is hanging out- looks kind of like skin. No pain or pressure.

## 2012-10-23 NOTE — MAU Note (Signed)
Pt states noted skin hanging down from her vagina today, not noted before, has not ever had this tissue hanging out before.

## 2012-10-24 NOTE — MAU Provider Note (Signed)
Attestation of Attending Supervision of Advanced Practitioner (PA/CNM/NP): Evaluation and management procedures were performed by the Advanced Practitioner under my supervision and collaboration.  I have reviewed the Advanced Practitioner's note and chart, and I agree with the management and plan.  Talise Sligh, MD, FACOG Attending Obstetrician & Gynecologist Faculty Practice, [facility]  

## 2012-12-06 ENCOUNTER — Inpatient Hospital Stay (HOSPITAL_COMMUNITY)
Admission: AD | Admit: 2012-12-06 | Discharge: 2012-12-06 | Disposition: A | Discharge status: Home / Self Care | Payer: Self-pay | Source: Ambulatory Visit | Attending: Obstetrics and Gynecology | Admitting: Obstetrics and Gynecology

## 2012-12-06 ENCOUNTER — Encounter (HOSPITAL_COMMUNITY): Payer: Self-pay | Admitting: *Deleted

## 2012-12-06 DIAGNOSIS — B373 Candidiasis of vulva and vagina: Secondary | ICD-10-CM

## 2012-12-06 DIAGNOSIS — B3731 Acute candidiasis of vulva and vagina: Secondary | ICD-10-CM | POA: Insufficient documentation

## 2012-12-06 DIAGNOSIS — N949 Unspecified condition associated with female genital organs and menstrual cycle: Secondary | ICD-10-CM | POA: Insufficient documentation

## 2012-12-06 LAB — URINE MICROSCOPIC-ADD ON

## 2012-12-06 LAB — URINALYSIS, ROUTINE W REFLEX MICROSCOPIC
Bilirubin Urine: NEGATIVE
Ketones, ur: 15 mg/dL — AB
Leukocytes, UA: NEGATIVE
Nitrite: NEGATIVE
Urobilinogen, UA: 0.2 mg/dL (ref 0.0–1.0)
pH: 6 (ref 5.0–8.0)

## 2012-12-06 MED ORDER — FLUCONAZOLE 150 MG PO TABS: 150.0000 mg | ORAL_TABLET | Freq: Once | ORAL | Status: DC

## 2012-12-06 NOTE — MAU Note (Signed)
Thinks might have an infection, yeast or bacterial.   Noted white foamy stuff when had intercourse.  Tried to get in to family planning, was unable.

## 2012-12-06 NOTE — MAU Note (Signed)
Denies pain, though has vaginal burning and irritation

## 2012-12-06 NOTE — MAU Provider Note (Signed)
History     CSN: [id number]  Arrival date and time: 12/06/12 1721   First Provider Initiated Contact with Patient 12/06/12 1825      Chief Complaint  Patient presents with  . Vaginal Discharge   HPI [name] is a 22 y.o. G1P1001 who presents to MAU today with vaginal discharge. The patient states that this has been present x 1 week. It is thick, white and "foamy" and causes itching, burning and irritation of the vaginal area. Occasionally causes burning  with urination. Patient denies urinary frequency or urgency. She is sexually active with one partner x 2 years. She denies N/V or fever.   OB History   Grav Para Term Preterm Abortions TAB SAB Ect Mult Living   1 1 1       1       Past Medical History  Diagnosis Date  . Asthma   . Right ureteral stone   . Anxiety     Past Surgical History  Procedure Laterality Date  . Cystoscopy w/ retrogrades  06/03/2012    Procedure: CYSTOSCOPY WITH RETROGRADE PYELOGRAM;  Surgeon: Valetta Fuller, MD;  Location: [facility];  Service: Urology;  Laterality: Right;    Family History  Problem Relation Age of Onset  . Anesthesia problems Neg Hx   . Von Willebrand disease Sister   . Hypertension Maternal Grandmother   . Cancer Paternal Grandmother     unknown origin  . Cancer Paternal Grandfather     unknown origin    History  Substance Use Topics  . Smoking status: Never Smoker   . Smokeless tobacco: Never Used  . Alcohol Use: Yes     Comment: occasional    Allergies:  Allergies  Allergen Reactions  . Vicodin (Hydrocodone-Acetaminophen) Nausea And Vomiting    Prescriptions prior to admission  Medication Sig Dispense Refill  . albuterol (PROVENTIL HFA;VENTOLIN HFA) 108 (90 BASE) MCG/ACT inhaler Inhale 2 puffs into the lungs every 6 (six) hours as needed. For shortness of breath      . budesonide-formoterol (SYMBICORT) 160-4.5 MCG/ACT inhaler Inhale 2 puffs into the lungs as needed. SOB        Review  of Systems  Constitutional: Negative for fever, chills and malaise/fatigue.  Gastrointestinal: Negative for nausea, vomiting and abdominal pain.  Genitourinary: Positive for dysuria. Negative for urgency and frequency.       + vaginal discharge Neg - abnormal vaginal bleeding    Physical Exam   Blood pressure 109/65, pulse 82, temperature 98 F (36.7 C), resp. rate 16, height 5\' 2"  (1.575 m), weight 108 lb (48.988 kg), unknown if currently breastfeeding.  Physical Exam  Constitutional: She is oriented to person, place, and time. She appears well-developed and well-nourished. No distress.  HENT:  Head: Normocephalic and atraumatic.  Cardiovascular: Normal rate.   Respiratory: Effort normal.  GI: Soft. She exhibits no distension and no mass. There is no tenderness. There is no rebound and no guarding.  Genitourinary: Vagina normal. Uterus is not enlarged and not tender. Cervix exhibits discharge (copious amount of thick, white discharge noted in the vaginal vault). Cervix exhibits no motion tenderness and no friability. Right adnexum displays no mass and no tenderness. Left adnexum displays no mass and no tenderness.  Neurological: She is alert and oriented to person, place, and time.  Skin: Skin is warm and dry. No erythema.  Psychiatric: She has a normal mood and affect.   Results for orders placed during the hospital  encounter of 12/06/12 (from the past 24 hour(s))  POCT PREGNANCY, URINE     Status: None   Collection Time    12/06/12  6:14 PM      Result Value Range   Preg Test, Ur NEGATIVE  NEGATIVE    MAU Course  Procedures None  MDM Wet prep today GC/Chlamydia pending  Assessment and Plan  A: Yeast vaginitis, clinical  P:  Discharge home Rx Diflucan sent to patient's pharmacy Return to MAU as needed or if symptoms were to change or worsen  Freddi Starr, PA-C  12/06/2012, 6:25 PM

## 2012-12-07 LAB — GC/CHLAMYDIA PROBE AMP
CT Probe RNA: NEGATIVE
GC Probe RNA: NEGATIVE

## 2012-12-11 NOTE — MAU Provider Note (Signed)
Attestation of Attending Supervision of Advanced Practitioner (CNM/NP): Evaluation and management procedures were performed by the Advanced Practitioner under my supervision and collaboration.  I have reviewed the Advanced Practitioner's note and chart, and I agree with the management and plan.  Marie Holt 12/11/2012 9:10 AM

## 2013-01-21 ENCOUNTER — Inpatient Hospital Stay (HOSPITAL_COMMUNITY)
Admission: AD | Admit: 2013-01-21 | Discharge: 2013-01-21 | Disposition: A | Discharge status: Home / Self Care | Payer: Self-pay | Source: Ambulatory Visit | Attending: Obstetrics & Gynecology | Admitting: Obstetrics & Gynecology

## 2013-01-21 ENCOUNTER — Encounter (HOSPITAL_COMMUNITY): Payer: Self-pay | Admitting: *Deleted

## 2013-01-21 ENCOUNTER — Inpatient Hospital Stay (HOSPITAL_COMMUNITY): Payer: Self-pay

## 2013-01-21 DIAGNOSIS — B9689 Other specified bacterial agents as the cause of diseases classified elsewhere: Secondary | ICD-10-CM | POA: Insufficient documentation

## 2013-01-21 DIAGNOSIS — N898 Other specified noninflammatory disorders of vagina: Secondary | ICD-10-CM

## 2013-01-21 DIAGNOSIS — N949 Unspecified condition associated with female genital organs and menstrual cycle: Secondary | ICD-10-CM | POA: Insufficient documentation

## 2013-01-21 DIAGNOSIS — N390 Urinary tract infection, site not specified: Secondary | ICD-10-CM | POA: Insufficient documentation

## 2013-01-21 DIAGNOSIS — A499 Bacterial infection, unspecified: Secondary | ICD-10-CM | POA: Insufficient documentation

## 2013-01-21 DIAGNOSIS — N760 Acute vaginitis: Secondary | ICD-10-CM | POA: Insufficient documentation

## 2013-01-21 LAB — URINE MICROSCOPIC-ADD ON

## 2013-01-21 LAB — URINALYSIS, ROUTINE W REFLEX MICROSCOPIC
Bilirubin Urine: NEGATIVE
Ketones, ur: NEGATIVE mg/dL
Nitrite: NEGATIVE
Protein, ur: NEGATIVE mg/dL
Urobilinogen, UA: 0.2 mg/dL (ref 0.0–1.0)

## 2013-01-21 LAB — WET PREP, GENITAL: Clue Cells Wet Prep HPF POC: NONE SEEN

## 2013-01-21 MED ORDER — METRONIDAZOLE 500 MG PO TABS: 500.0000 mg | ORAL_TABLET | Freq: Two times a day (BID) | ORAL | Status: DC

## 2013-01-21 MED ORDER — FLUCONAZOLE 150 MG PO TABS: ORAL_TABLET | ORAL | Status: DC

## 2013-01-21 NOTE — MAU Note (Signed)
C/o persistent yeast infection that has not cleared up since the end of Feb.

## 2013-01-21 NOTE — MAU Note (Signed)
Patient states she was recently treated for a yeast infection. States it has not worked and has used OTC medication twice and still has a white thick discharge with itching/burning.

## 2013-01-21 NOTE — MAU Provider Note (Signed)
History     CSN: [id number]  Arrival date and time: 01/21/13 1053   First Provider Initiated Contact with Patient 01/21/13 1134      Chief Complaint  Patient presents with  . Vaginal Discharge   Vaginal Discharge Pertinent negatives include no chills, fever or headaches.    Marie Holt is a 22 y.o. female who presents with vaginal discharge x 7 weeks. Pt states she was here at the end of February was treated for a yeast infection, never improved. Tried some monistat otc with no improvement x2 about 1 week apart. Pt states there is copius amounts of thick white discharge, pt has some intermittent irritation associated with it. Pt states it has gotten worse since the last visit. She is not on any birth control. She denies new sexual partners. She denies fever or abdominal pain.   OB History   Grav Para Term Preterm Abortions TAB SAB Ect Mult Living   1 1 1       1       Past Medical History  Diagnosis Date  . Asthma   . Right ureteral stone   . Anxiety     Past Surgical History  Procedure Laterality Date  . Cystoscopy w/ retrogrades  06/03/2012    Procedure: CYSTOSCOPY WITH RETROGRADE PYELOGRAM;  Surgeon: Valetta Fuller, MD;  Location: [facility];  Service: Urology;  Laterality: Right;    Family History  Problem Relation Age of Onset  . Anesthesia problems Neg Hx   . Von Willebrand disease Sister   . Hypertension Maternal Grandmother   . Cancer Paternal Grandmother     unknown origin  . Cancer Paternal Grandfather     unknown origin    History  Substance Use Topics  . Smoking status: Never Smoker   . Smokeless tobacco: Never Used  . Alcohol Use: Yes     Comment: occasional    Allergies:  Allergies  Allergen Reactions  . Vicodin (Hydrocodone-Acetaminophen) Nausea And Vomiting    Prescriptions prior to admission  Medication Sig Dispense Refill  . albuterol (PROVENTIL HFA;VENTOLIN HFA) 108 (90 BASE) MCG/ACT inhaler Inhale 2 puffs into the  lungs every 6 (six) hours as needed. For shortness of breath      . budesonide-formoterol (SYMBICORT) 160-4.5 MCG/ACT inhaler Inhale 2 puffs into the lungs as needed. SOB        Review of Systems  Constitutional: Negative for fever and chills.  Eyes: Negative.   Respiratory: Negative.   Cardiovascular: Negative.   Gastrointestinal: Negative.   Genitourinary: Negative.   Neurological: Negative for headaches.   Physical Exam   Blood pressure 103/65, pulse 69, temperature 98 F (36.7 C), temperature source Oral, resp. rate 18, height 5' 2.5" (1.588 m), weight 48.626 kg (107 lb 3.2 oz), last menstrual period 11/09/2012, SpO2 100.00%.  Physical Exam  Constitutional: She is oriented to person, place, and time. She appears well-developed and well-nourished.  HENT:  Head: Normocephalic and atraumatic.  Eyes: EOM are normal.  Cardiovascular: Normal rate, regular rhythm, normal heart sounds and intact distal pulses.  Exam reveals no gallop and no friction rub.   No murmur heard. Respiratory: Effort normal and breath sounds normal. No respiratory distress. She has no wheezes. She has no rales. She exhibits no tenderness.  GI: Soft. Bowel sounds are normal. She exhibits no distension and no mass. There is no tenderness. There is no rebound and no guarding.  Genitourinary: Uterus normal. There is no rash, tenderness or lesion  on the right labia. There is no rash, tenderness or lesion on the left labia. Cervix exhibits discharge (copius amount of thick white discharge in the vaginal vault with thin white discharge pooling in the speculum during exam). Cervix exhibits no motion tenderness and no friability. Right adnexum displays no mass and no tenderness. Left adnexum displays no mass and no tenderness. No erythema, tenderness or bleeding around the vagina. No signs of injury around the vagina.  Neurological: She is alert and oriented to person, place, and time.  Skin: Skin is warm and dry.    Results for orders placed during the hospital encounter of 01/21/13 (from the past 24 hour(s))  URINALYSIS, ROUTINE W REFLEX MICROSCOPIC     Status: Abnormal   Collection Time    01/21/13 11:05 AM      Result Value Range   Color, Urine YELLOW  YELLOW   APPearance HAZY (*) CLEAR   Specific Gravity, Urine >1.030 (*) 1.005 - 1.030   pH 6.0  5.0 - 8.0   Glucose, UA NEGATIVE  NEGATIVE mg/dL   Hgb urine dipstick TRACE (*) NEGATIVE   Bilirubin Urine NEGATIVE  NEGATIVE   Ketones, ur NEGATIVE  NEGATIVE mg/dL   Protein, ur NEGATIVE  NEGATIVE mg/dL   Urobilinogen, UA 0.2  0.0 - 1.0 mg/dL   Nitrite NEGATIVE  NEGATIVE   Leukocytes, UA TRACE (*) NEGATIVE  URINE MICROSCOPIC-ADD ON     Status: Abnormal   Collection Time    01/21/13 11:05 AM      Result Value Range   Squamous Epithelial / LPF MANY (*) RARE   WBC, UA 3-6  <3 WBC/hpf   RBC / HPF 0-2  <3 RBC/hpf   Bacteria, UA FEW (*) RARE   Urine-Other MUCOUS PRESENT    POCT PREGNANCY, URINE     Status: None   Collection Time    01/21/13 11:16 AM      Result Value Range   Preg Test, Ur NEGATIVE  NEGATIVE  WET PREP, GENITAL     Status: Abnormal   Collection Time    01/21/13  1:06 PM      Result Value Range   Yeast Wet Prep HPF POC NONE SEEN  NONE SEEN   Trich, Wet Prep NONE SEEN  NONE SEEN   Clue Cells Wet Prep HPF POC NONE SEEN  NONE SEEN   WBC, Wet Prep HPF POC MANY (*) NONE SEEN     US Transvaginal Non-ob  01/21/2013  *RADIOLOGY REPORT*  Clinical Data: Perfused vaginal discharge. Irregular menses.  LMP 11/09/2012.  TRANSABDOMINAL AND TRANSVAGINAL ULTRASOUND OF PELVIS  Technique:  Both transabdominal and transvaginal ultrasound examinations of the pelvis were performed.  Transabdominal technique was performed for global imaging of the pelvis including uterus, ovaries, adnexal regions, and pelvic cul-de-sac.  It was necessary to proceed with endovaginal exam following the transabdominal exam to visualize the endometrium and adnexae.   Comparison:  None.  Findings: Uterus:  8.1 x 3.4 x 5.8 cm.  No fibroids or other uterine masses are identified.  Endometrium: Double layer thickness measures 5 mm transvaginally. No focal lesions visualized.  Right ovary: 3.3 x 2.1 x 2.6 cm.  Normal appearance.  No adnexal mass identified.  Left ovary: 2.7 x 1.5 x 1.9 cm.  Normal appearance.  No adnexal mass identified.  Other Findings:  No free fluid  IMPRESSION: Negative.  No evidence of pelvic mass or other significant abnormality.   Original Report Authenticated By: Myles Rosenthal, M.D.  US Pelvis Complete  01/21/2013  *RADIOLOGY REPORT*  Clinical Data: Perfused vaginal discharge. Irregular menses.  LMP 11/09/2012.  TRANSABDOMINAL AND TRANSVAGINAL ULTRASOUND OF PELVIS  Technique:  Both transabdominal and transvaginal ultrasound examinations of the pelvis were performed.  Transabdominal technique was performed for global imaging of the pelvis including uterus, ovaries, adnexal regions, and pelvic cul-de-sac.  It was necessary to proceed with endovaginal exam following the transabdominal exam to visualize the endometrium and adnexae.  Comparison:  None.  Findings: Uterus:  8.1 x 3.4 x 5.8 cm.  No fibroids or other uterine masses are identified.  Endometrium: Double layer thickness measures 5 mm transvaginally. No focal lesions visualized.  Right ovary: 3.3 x 2.1 x 2.6 cm.  Normal appearance.  No adnexal mass identified.  Left ovary: 2.7 x 1.5 x 1.9 cm.  Normal appearance.  No adnexal mass identified.  Other Findings:  No free fluid  IMPRESSION: Negative.  No evidence of pelvic mass or other significant abnormality.   Original Report Authenticated By: Myles Rosenthal, M.D.     MAU Course  Procedures  MDM UA, Wet prep, bimanual Sent for pelvic/tranvaginal [place] for unexplained profuse discharge  Discussed with Dr. Erin Fulling. She recommends treatment with Flagyl and Diflucan as noted below and follow-up in the clinic in ~ 2 weeks. Patient may require yeast  culture at that time if symptoms persist.  Assessment and Plan  A: Vaginal discharge: will treat clinically for BV and yeast infection.   P: flagyl 500 mg bid x 7 days. Diflucan 150 mg tab every other day x 3 Follow up in clinic in 2 weeks.  Patient may return to MAU as needed or if her symptoms were to change or worsen  Rosalee Kaufman 01/21/2013, 11:36 AM   I have seen and evaluated this patient with the PA student. I have added/edited the above note to reflect my observations/interactions with the patient and attending. I agree with the assessment and plan as written above.  Freddi Starr, PA-C 01/21/2013 5:04 PM

## 2013-01-22 LAB — URINE CULTURE: Colony Count: 65000

## 2013-01-23 ENCOUNTER — Encounter: Payer: Self-pay | Admitting: Obstetrics and Gynecology

## 2013-01-23 NOTE — MAU Provider Note (Signed)
Attestation of Attending Supervision of Advanced Practitioner (CNM/NP): Evaluation and management procedures were performed by the Advanced Practitioner under my supervision and collaboration.  I have reviewed the Advanced Practitioner's note and chart, and I agree with the management and plan.  HARRAWAY-SMITH, Taylor Spilde 2:57 PM     

## 2013-02-05 ENCOUNTER — Encounter: Payer: Self-pay | Admitting: Obstetrics and Gynecology

## 2013-02-21 ENCOUNTER — Inpatient Hospital Stay (HOSPITAL_COMMUNITY)
Admission: AD | Admit: 2013-02-21 | Discharge: 2013-02-21 | Disposition: A | Discharge status: Home / Self Care | Payer: Self-pay | Source: Ambulatory Visit | Attending: Obstetrics & Gynecology | Admitting: Obstetrics & Gynecology

## 2013-02-21 ENCOUNTER — Encounter (HOSPITAL_COMMUNITY): Payer: Self-pay | Admitting: *Deleted

## 2013-02-21 DIAGNOSIS — B3731 Acute candidiasis of vulva and vagina: Secondary | ICD-10-CM

## 2013-02-21 DIAGNOSIS — B373 Candidiasis of vulva and vagina: Secondary | ICD-10-CM

## 2013-02-21 DIAGNOSIS — R112 Nausea with vomiting, unspecified: Secondary | ICD-10-CM

## 2013-02-21 DIAGNOSIS — Z3202 Encounter for pregnancy test, result negative: Secondary | ICD-10-CM | POA: Insufficient documentation

## 2013-02-21 DIAGNOSIS — N949 Unspecified condition associated with female genital organs and menstrual cycle: Secondary | ICD-10-CM | POA: Insufficient documentation

## 2013-02-21 HISTORY — DX: Candidiasis, unspecified: B37.9

## 2013-02-21 LAB — URINALYSIS, ROUTINE W REFLEX MICROSCOPIC
Glucose, UA: NEGATIVE mg/dL
Specific Gravity, Urine: >1.03 — ABNORMAL HIGH (ref 1.005–1.030)
Urobilinogen, UA: 0.2 mg/dL (ref 0.0–1.0)
pH: 6 (ref 5.0–8.0)

## 2013-02-21 LAB — URINE MICROSCOPIC-ADD ON

## 2013-02-21 LAB — WET PREP, GENITAL

## 2013-02-21 LAB — POCT PREGNANCY, URINE: Preg Test, Ur: NEGATIVE

## 2013-02-21 MED ORDER — PROMETHAZINE HCL 25 MG PO TABS: 25.0000 mg | ORAL_TABLET | Freq: Four times a day (QID) | ORAL | Status: DC | PRN

## 2013-02-21 MED ORDER — LACTATED RINGERS IV BOLUS (SEPSIS)
1000.0000 mL | Freq: Once | INTRAVENOUS | Status: AC
  Administered 2013-02-21: 1000 mL via INTRAVENOUS

## 2013-02-21 MED ORDER — ONDANSETRON HCL 4 MG/2ML IJ SOLN
4.0000 mg | Freq: Once | INTRAMUSCULAR | Status: AC
  Administered 2013-02-21: 4 mg via INTRAVENOUS
  Filled 2013-02-21: qty 2

## 2013-02-21 MED ORDER — FLUCONAZOLE 150 MG PO TABS: ORAL_TABLET | ORAL | Status: DC

## 2013-02-21 NOTE — MAU Note (Signed)
Vomiting X 2 days. No fever or diarrhea. Stomach hurts really bad. Can't sleep.

## 2013-02-21 NOTE — MAU Provider Note (Signed)
History     CSN: [id number]  Arrival date and time: 02/21/13 4098   First Provider Initiated Contact with Patient 02/21/13 [phone]      Chief Complaint  Patient presents with  . Emesis   HPI 22 y.o. G1P1001 here with thick, white clumpy discharge. Has had recurrent yeast infections over last few months. Took 3 dose course of diflucan last month, symptoms improved shortly, but have returned. States she can't use "the cream" because she had a reaction to it in the past, but she doesn't know which cream it was. Also c/o n/v x 4 days, no fever or diarrhea, thinks she may be pregnant. Patient's last menstrual period was 01/07/2013.   Past Medical History  Diagnosis Date  . Asthma   . Right ureteral stone   . Anxiety   . Yeast infection     Past Surgical History  Procedure Laterality Date  . Cystoscopy w/ retrogrades  06/03/2012    Procedure: CYSTOSCOPY WITH RETROGRADE PYELOGRAM;  Surgeon: Valetta Fuller, MD;  Location: [facility];  Service: Urology;  Laterality: Right;    Family History  Problem Relation Age of Onset  . Anesthesia problems Neg Hx   . Von Willebrand disease Sister   . Hypertension Maternal Grandmother   . Cancer Paternal Grandmother     unknown origin  . Cancer Paternal Grandfather     unknown origin    History  Substance Use Topics  . Smoking status: Never Smoker   . Smokeless tobacco: Never Used  . Alcohol Use: Yes     Comment: occasional    Allergies:  Allergies  Allergen Reactions  . Vicodin (Hydrocodone-Acetaminophen) Nausea And Vomiting    Prescriptions prior to admission  Medication Sig Dispense Refill  . albuterol (PROVENTIL HFA;VENTOLIN HFA) 108 (90 BASE) MCG/ACT inhaler Inhale 2 puffs into the lungs every 6 (six) hours as needed. For shortness of breath      . budesonide-formoterol (SYMBICORT) 160-4.5 MCG/ACT inhaler Inhale 2 puffs into the lungs as needed. SOB        Review of Systems  Constitutional: Negative.    Respiratory: Negative.   Cardiovascular: Negative.   Gastrointestinal: Negative for nausea, vomiting, abdominal pain, diarrhea and constipation.  Genitourinary: Negative for dysuria, urgency, frequency, hematuria and flank pain.       Negative for vaginal bleeding, + vaginal discharge  Musculoskeletal: Negative.   Neurological: Negative.   Psychiatric/Behavioral: Negative.    Physical Exam   Blood pressure 102/66, pulse 93, temperature 98.5 F (36.9 C), temperature source Oral, resp. rate 18, height 5\' 2"  (1.575 m), weight 102 lb (46.267 kg), last menstrual period 01/07/2013, SpO2 99.00%.  Physical Exam  Nursing note and vitals reviewed. Constitutional: She is oriented to person, place, and time. She appears well-developed and well-nourished. No distress.  Cardiovascular: Normal rate.   Respiratory: Effort normal.  GI: Soft. There is no tenderness.  Genitourinary: Vaginal discharge (thick, white, curdlike) found.  Musculoskeletal: Normal range of motion.  Neurological: She is alert and oriented to person, place, and time.  Skin: Skin is warm and dry.  Psychiatric: She has a normal mood and affect.    MAU Course  Procedures  Results for orders placed during the hospital encounter of 02/21/13 (from the past 24 hour(s))  URINALYSIS, ROUTINE W REFLEX MICROSCOPIC     Status: Abnormal   Collection Time    02/21/13  7:30 AM      Result Value Range   Color, Urine YELLOW  YELLOW  APPearance CLEAR  CLEAR   Specific Gravity, Urine >1.030 (*) 1.005 - 1.030   pH 6.0  5.0 - 8.0   Glucose, UA NEGATIVE  NEGATIVE mg/dL   Hgb urine dipstick TRACE (*) NEGATIVE   Bilirubin Urine SMALL (*) NEGATIVE   Ketones, ur 40 (*) NEGATIVE mg/dL   Protein, ur 30 (*) NEGATIVE mg/dL   Urobilinogen, UA 0.2  0.0 - 1.0 mg/dL   Nitrite NEGATIVE  NEGATIVE   Leukocytes, UA TRACE (*) NEGATIVE  URINE MICROSCOPIC-ADD ON     Status: Abnormal   Collection Time    02/21/13  7:30 AM      Result Value Range    Squamous Epithelial / LPF MANY (*) RARE   WBC, UA 0-2  <3 WBC/hpf   RBC / HPF 0-2  <3 RBC/hpf   Urine-Other MUCOUS PRESENT    POCT PREGNANCY, URINE     Status: None   Collection Time    02/21/13  7:39 AM      Result Value Range   Preg Test, Ur NEGATIVE  NEGATIVE  WET PREP, GENITAL     Status: Abnormal   Collection Time    02/21/13  8:52 AM      Result Value Range   Yeast Wet Prep HPF POC MODERATE (*) NONE SEEN   Trich, Wet Prep NONE SEEN  NONE SEEN   Clue Cells Wet Prep HPF POC NONE SEEN  NONE SEEN   WBC, Wet Prep HPF POC MODERATE (*) NONE SEEN     Assessment and Plan   1. Yeast vaginitis   2. Nausea and vomiting in adult   Diflucan x as prescribed below for recurrent yeast, rev'd hygiene measures, probiotics Phenergan for n/v - negative UPT today, should repeat in 1 week if no period. If not pregnant and symptoms do not resolve within 1 week, should follow up with general practitioner May call GYN clinic for follow up as needed    Medication List    TAKE these medications       albuterol 108 (90 BASE) MCG/ACT inhaler  Commonly known as:  PROVENTIL HFA;VENTOLIN HFA  Inhale 2 puffs into the lungs every 6 (six) hours as needed. For shortness of breath     budesonide-formoterol 160-4.5 MCG/ACT inhaler  Commonly known as:  SYMBICORT  Inhale 2 puffs into the lungs as needed. SOB     fluconazole 150 MG tablet  Commonly known as:  DIFLUCAN  1 tab PO q 3 days x 3 doses, then 1 tab weekly x 6 months     promethazine 25 MG tablet  Commonly known as:  PHENERGAN  Take 1 tablet (25 mg total) by mouth every 6 (six) hours as needed for nausea.          Iliana Hutt 02/21/2013, 8:14 AM

## 2013-02-21 NOTE — MAU Note (Signed)
Wants to be checked for yeast infection. States the last 2 times she was here she was treated for bacteria and yeast. States she has thick white clumpy discharge. States she was told the clinic was going to call her with an appointment, but they never did.

## 2013-02-22 NOTE — MAU Provider Note (Signed)
Attestation of Attending Supervision of Advanced Practitioner (CNM/NP): Evaluation and management procedures were performed by the Advanced Practitioner under my supervision and collaboration.  I have reviewed the Advanced Practitioner's note and chart, and I agree with the management and plan.  HARRAWAY-SMITH, Ashani Pumphrey 5:39 PM     

## 2013-04-26 IMAGING — CT CT ABD-PEL WO/W CM
1 of 3 series · 14 of 32 positions shown, 19 images · IV contrast (omnipaque)
Comparison: None.

CLINICAL DATA: Right lower quadrant abdominal pain.  Recent vaginal
delivery on 04/01/2012.

CT ABDOMEN AND PELVIS WITHOUT AND WITH CONTRAST
TECHNIQUE: Multidetector CT imaging of the abdomen and pelvis was
performed without contrast material in one or both body regions,
followed by contrast material(s) and further sections in one or
both body regions.
Contrast: 100mL OMNIPAQUE IOHEXOL 300 MG/ML  SOLN

[Series 5: routine abdomen/pelvis with · axial · 0.68mm/px · z∈[-377,+8]mm · 14 of 89 slices shown, 19 images]
[im 6/89  soft-tissue]
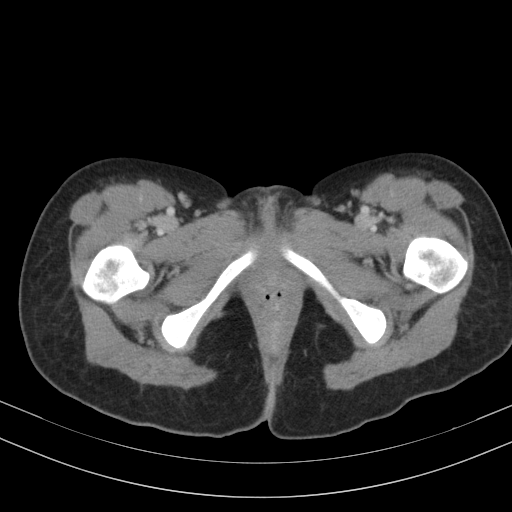
[im 6/89  bone]
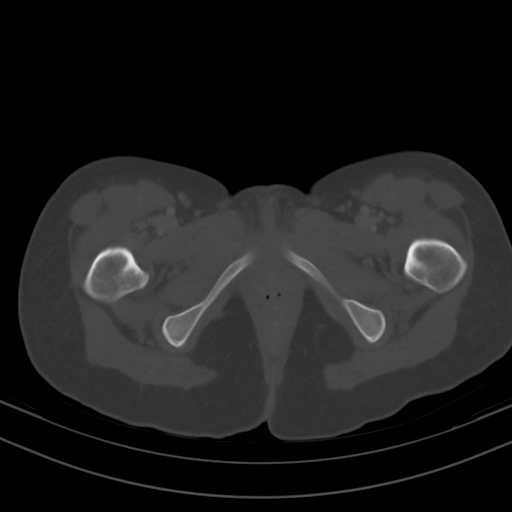
[im 11/89  soft-tissue]
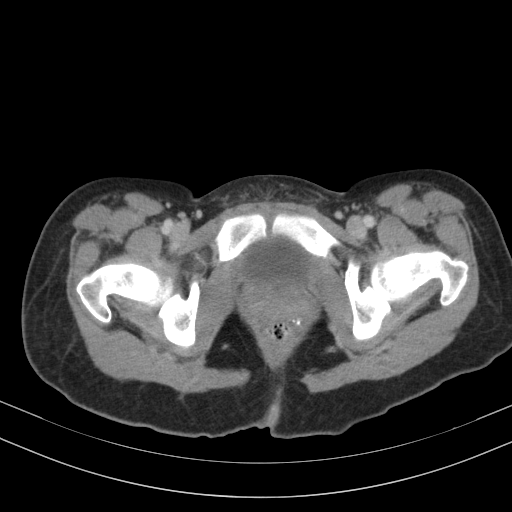
[im 21/89  soft-tissue]
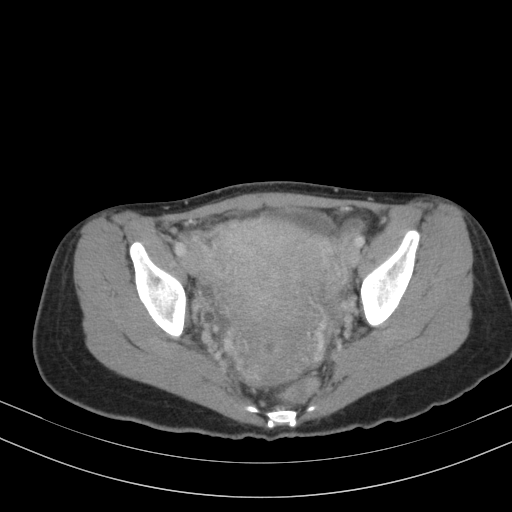
[im 26/89  soft-tissue]
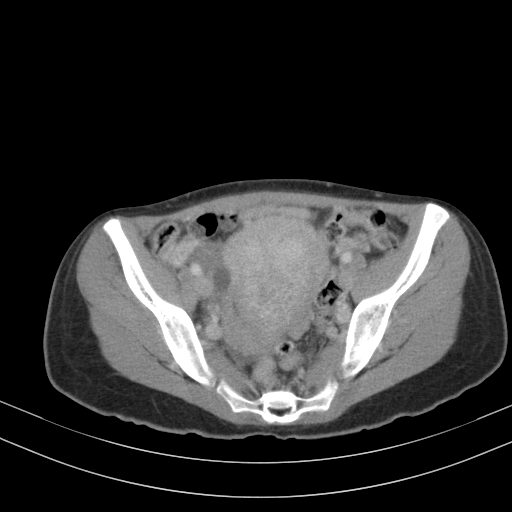
[im 32/89  soft-tissue]
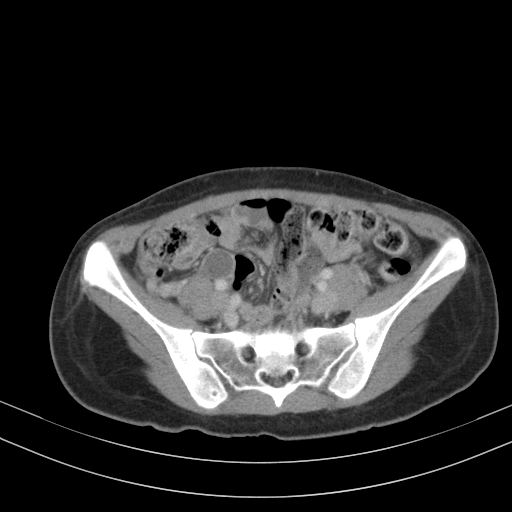
[im 37/89  soft-tissue]
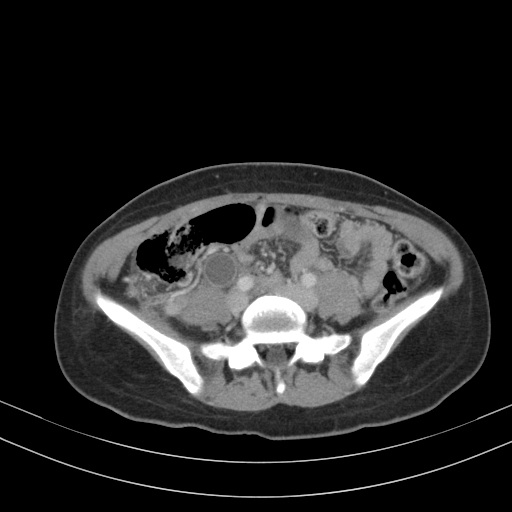
[im 47/89  soft-tissue]
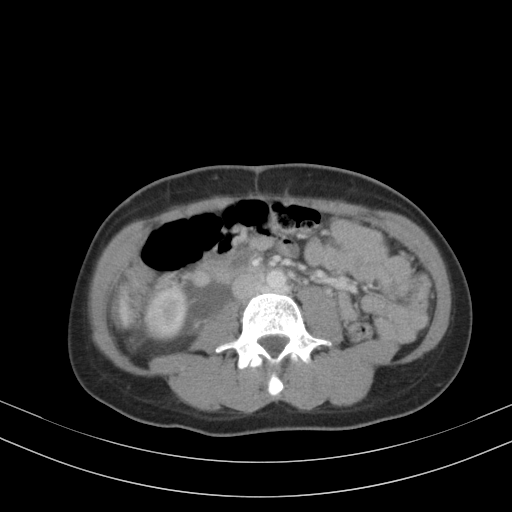
[im 52/89  soft-tissue]
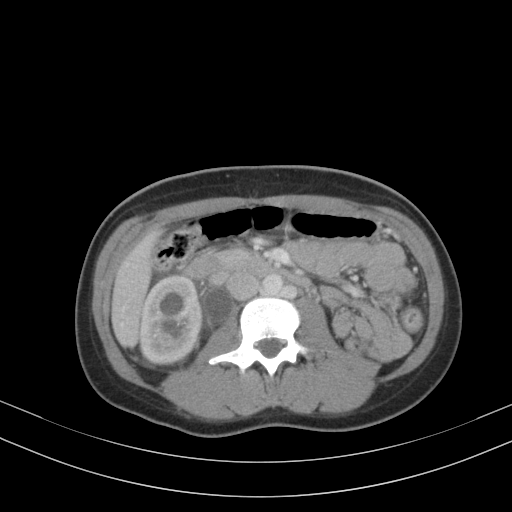
[im 57/89  soft-tissue]
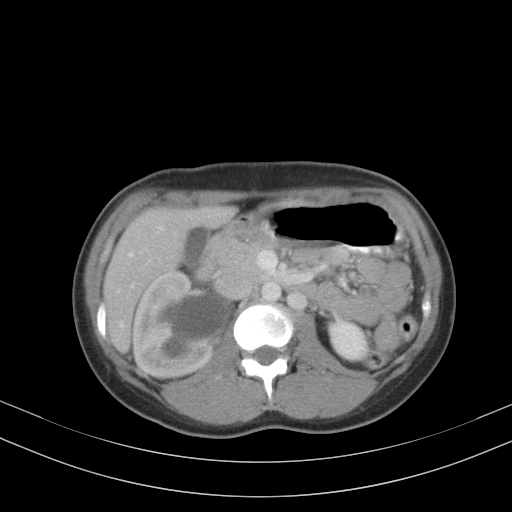
[im 57/89  bone]
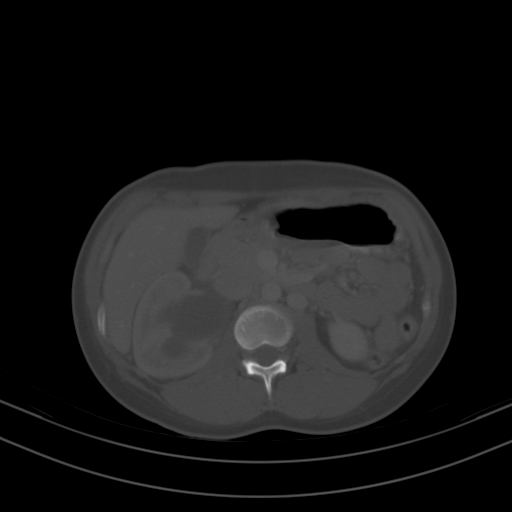
[im 63/89  soft-tissue]
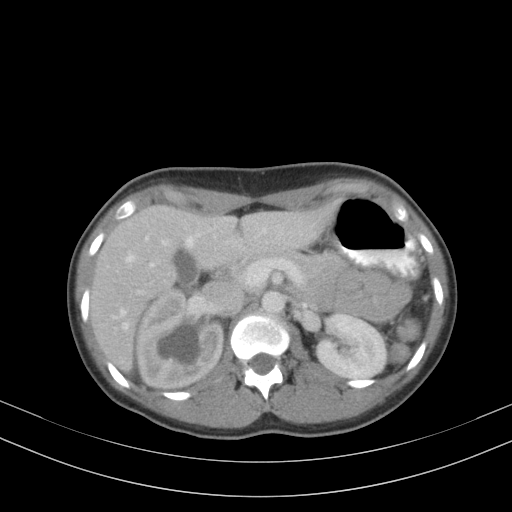
[im 68/89  soft-tissue]
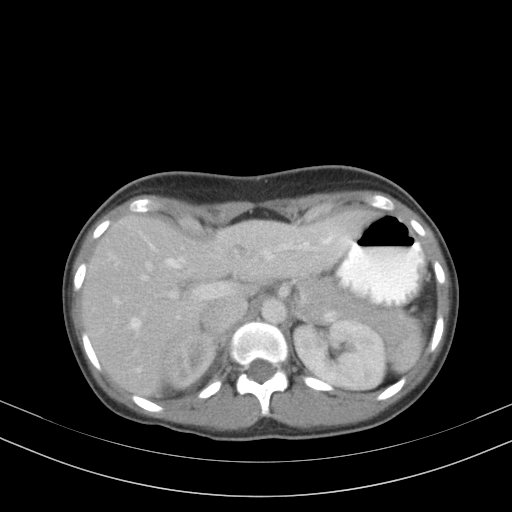
[im 68/89  lung]
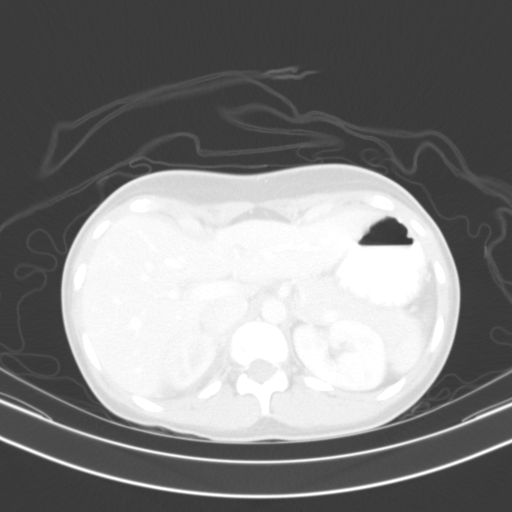
[im 73/89  lung]
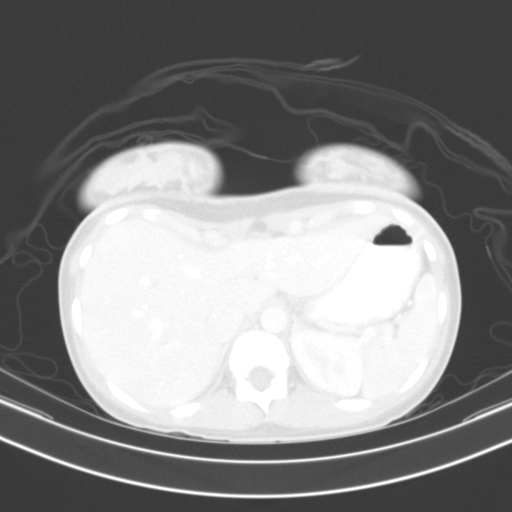
[im 78/89  soft-tissue]
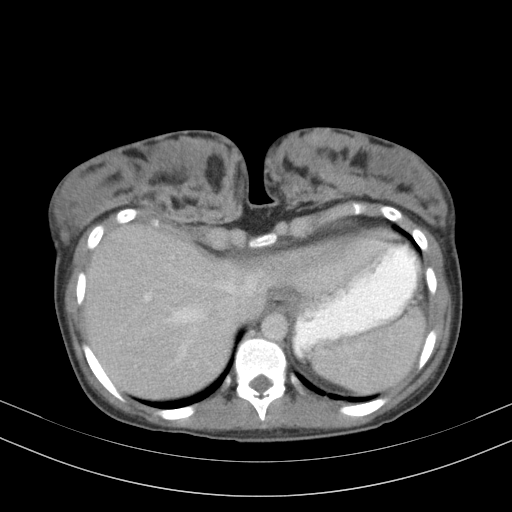
[im 78/89  lung]
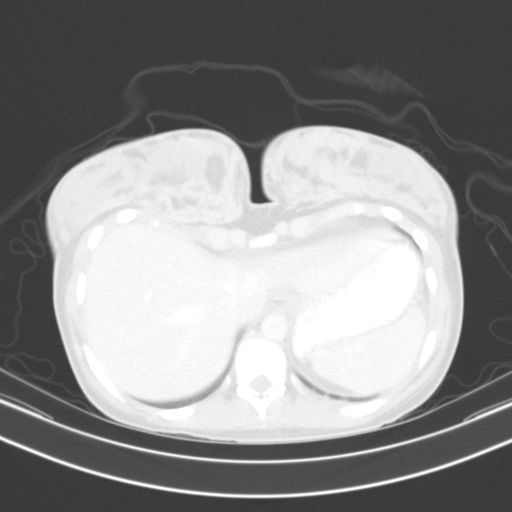
[im 83/89  soft-tissue]
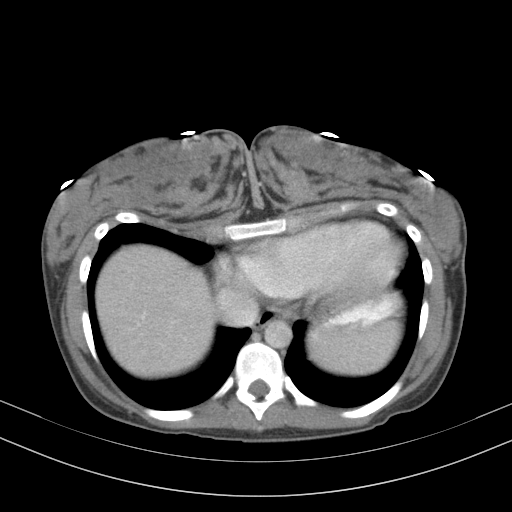
[im 83/89  lung]
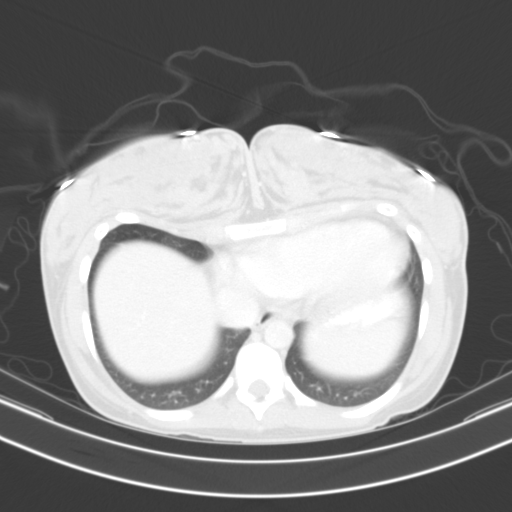

[14 of 32 positions shown; findings below may reference images not displayed]

FINDINGS: Gas density noted in the distal esophagus.  Oral contrast
medium is present in the stomach.

The liver, spleen, pancreas, and adrenal glands appear
unremarkable.

The gallbladder and biliary system appear unremarkable.

Marked right hydronephrosis noted with delayed nephrogram and
prominent right hydroureter extending down to a 5 mm right UVJ
calculus.

There is a suspected 1-2 mm right kidney lower pole nonobstructive
calculus.  Right perirenal and periureteral stranding is present.

Left kidney appears unremarkable.

Retroaortic left renal vein noted. No pathologic retroperitoneal or
porta hepatis adenopathy is identified.

No pathologic pelvic adenopathy is identified.

Expected uterine appearance noted given the patients recent
postpartum status.  Trace free pelvic fluid is observed.  Urinary
bladder appears unremarkable.
IMPRESSION: 1.  The dominant finding is a obstructive 5 mm right UVJ stone
associated with prominent right hydronephrosis, hydroureter,
delayed right nephrogram, perirenal stranding, and periureteral
stranding on the right.
2.  Trace free pelvic fluid.
3.  Expected appearance of the uterus given the patient's recent
postpartum status.
4.  A suspected 1-2 mm right kidney lower pole nonobstructive
calculus is also noted.

## 2013-05-08 ENCOUNTER — Inpatient Hospital Stay (HOSPITAL_COMMUNITY)
Admission: AD | Admit: 2013-05-08 | Discharge: 2013-05-08 | Disposition: A | Discharge status: Home / Self Care | Payer: Self-pay | Source: Ambulatory Visit | Attending: Obstetrics & Gynecology | Admitting: Obstetrics & Gynecology

## 2013-05-08 ENCOUNTER — Encounter (HOSPITAL_COMMUNITY): Payer: Self-pay | Admitting: *Deleted

## 2013-05-08 DIAGNOSIS — N949 Unspecified condition associated with female genital organs and menstrual cycle: Secondary | ICD-10-CM | POA: Insufficient documentation

## 2013-05-08 DIAGNOSIS — N907 Vulvar cyst: Secondary | ICD-10-CM

## 2013-05-08 DIAGNOSIS — B373 Candidiasis of vulva and vagina: Secondary | ICD-10-CM

## 2013-05-08 DIAGNOSIS — N926 Irregular menstruation, unspecified: Secondary | ICD-10-CM | POA: Insufficient documentation

## 2013-05-08 DIAGNOSIS — B3731 Acute candidiasis of vulva and vagina: Secondary | ICD-10-CM | POA: Insufficient documentation

## 2013-05-08 DIAGNOSIS — N750 Cyst of Bartholin's gland: Secondary | ICD-10-CM | POA: Insufficient documentation

## 2013-05-08 LAB — POCT PREGNANCY, URINE: Preg Test, Ur: NEGATIVE

## 2013-05-08 LAB — URINALYSIS, ROUTINE W REFLEX MICROSCOPIC
Glucose, UA: NEGATIVE mg/dL
Protein, ur: NEGATIVE mg/dL
Specific Gravity, Urine: >1.03 — ABNORMAL HIGH (ref 1.005–1.030)

## 2013-05-08 LAB — WET PREP, GENITAL: Clue Cells Wet Prep HPF POC: NONE SEEN

## 2013-05-08 LAB — URINE MICROSCOPIC-ADD ON

## 2013-05-08 MED ORDER — FLUCONAZOLE 150 MG PO TABS: 150.0000 mg | ORAL_TABLET | Freq: Once | ORAL | Status: DC

## 2013-05-08 MED ORDER — FLUCONAZOLE 150 MG PO TABS
150.0000 mg | ORAL_TABLET | Freq: Once | ORAL | Status: AC
  Administered 2013-05-08: 150 mg via ORAL
  Filled 2013-05-08: qty 1

## 2013-05-08 NOTE — MAU Note (Signed)
Was told there is a cyst, but was told they couldn't do anything yet.   Lump has been there over a year, goes away and comes back- now back again. Has a thick white clumpy d/c, off and on for a while.  really heavy past 2-3 days.

## 2013-05-08 NOTE — MAU Provider Note (Signed)
Attestation of Attending Supervision of Advanced Practitioner (PA/CNM/NP): Evaluation and management procedures were performed by the Advanced Practitioner under my supervision and collaboration.  I have reviewed the Advanced Practitioner's note and chart, and I agree with the management and plan.  Markeeta Scalf, MD, FACOG Attending Obstetrician & Gynecologist Faculty Practice, [facility]  

## 2013-05-08 NOTE — MAU Provider Note (Signed)
History     CSN: [id number]  Arrival date and time: 05/08/13 1534   First Provider Initiated Contact with Patient 05/08/13 1625      Chief Complaint  Patient presents with  . vaginal irritation/ lump    HPI  Pt is not pregnant and presents with recurring right labial cyst and persistant yeast vaginal infection.Pt has irregular menses and is not using anything for contraception.  Pt has used Depo Provera in the past.  Pt thinks she is not a good pill candidate b/c she doesn't think she will be consistant. Pt has large amount of white discharge and vaginal irritation.  Pt has been treated for yeast infection with multiple doses of Diflucan.  Pt denies dysuria, UTI sx or pelvic pain.  Pt has previously had right ?Bartholin gland cyst but was not incised. Pt states the cyst comes and goes.  Past Medical History  Diagnosis Date  . Asthma   . Right ureteral stone   . Anxiety   . Yeast infection     Past Surgical History  Procedure Laterality Date  . Cystoscopy w/ retrogrades  06/03/2012    Procedure: CYSTOSCOPY WITH RETROGRADE PYELOGRAM;  Surgeon: Valetta Fuller, MD;  Location: [facility];  Service: Urology;  Laterality: Right;    Family History  Problem Relation Age of Onset  . Anesthesia problems Neg Hx   . Von Willebrand disease Sister   . Hypertension Maternal Grandmother   . Cancer Paternal Grandmother     unknown origin  . Cancer Paternal Grandfather     unknown origin  . Hypertension Mother   . Cancer Father     lung    History  Substance Use Topics  . Smoking status: Never Smoker   . Smokeless tobacco: Never Used  . Alcohol Use: Yes     Comment: occasional    Allergies:  Allergies  Allergen Reactions  . Vicodin (Hydrocodone-Acetaminophen) Nausea And Vomiting    Prescriptions prior to admission  Medication Sig Dispense Refill  . budesonide-formoterol (SYMBICORT) 160-4.5 MCG/ACT inhaler Inhale 2 puffs into the lungs as needed. SOB      .  albuterol (PROVENTIL HFA;VENTOLIN HFA) 108 (90 BASE) MCG/ACT inhaler Inhale 2 puffs into the lungs every 6 (six) hours as needed. For shortness of breath        Review of Systems  Constitutional: Negative for fever and chills.  Gastrointestinal: Negative for nausea, vomiting, abdominal pain, diarrhea and constipation.  Genitourinary: Negative for dysuria and urgency.   Physical Exam   Blood pressure 106/77, pulse 84, temperature 97.3 F (36.3 C), temperature source Oral, resp. rate 16, height 5\' 2"  (1.575 m), weight 46.72 kg (103 lb), last menstrual period 03/30/2013.  Physical Exam  Vitals reviewed. Constitutional: She is oriented to person, place, and time. She appears well-developed and well-nourished. No distress.  HENT:  Head: Normocephalic.  Eyes: Pupils are equal, round, and reactive to light.  Neck: Normal range of motion. Neck supple.  Cardiovascular: Normal rate.   Respiratory: Effort normal.  GI: Soft. She exhibits no distension. There is no tenderness. There is no rebound and no guarding.  Genitourinary:  Small, soft cystic area right labia, mildly tender- no bulging or pointing; vaginal mucosa reddened; copious amounts of thick white discharge adherant to walls of vagina and in vault; cervix clean, NT; uterus NSSC, adnexa without palpable enlargement- mildly tender right adnexa  Musculoskeletal: Normal range of motion.  Neurological: She is alert and oriented to person, place, and time.  Skin: Skin is warm and dry.  Psychiatric: She has a normal mood and affect.    MAU Course  Procedures Results for orders placed during the hospital encounter of 05/08/13 (from the past 24 hour(s))  URINALYSIS, ROUTINE W REFLEX MICROSCOPIC     Status: Abnormal   Collection Time    05/08/13  3:50 PM      Result Value Range   Color, Urine YELLOW  YELLOW   APPearance HAZY (*) CLEAR   Specific Gravity, Urine >1.030 (*) 1.005 - 1.030   pH 5.5  5.0 - 8.0   Glucose, UA NEGATIVE   NEGATIVE mg/dL   Hgb urine dipstick TRACE (*) NEGATIVE   Bilirubin Urine NEGATIVE  NEGATIVE   Ketones, ur NEGATIVE  NEGATIVE mg/dL   Protein, ur NEGATIVE  NEGATIVE mg/dL   Urobilinogen, UA 0.2  0.0 - 1.0 mg/dL   Nitrite NEGATIVE  NEGATIVE   Leukocytes, UA MODERATE (*) NEGATIVE  URINE MICROSCOPIC-ADD ON     Status: Abnormal   Collection Time    05/08/13  3:50 PM      Result Value Range   Squamous Epithelial / LPF FEW (*) RARE   WBC, UA 11-20  <3 WBC/hpf   RBC / HPF 7-10  <3 RBC/hpf   Bacteria, UA MANY (*) RARE   Urine-Other MANY YEAST    POCT PREGNANCY, URINE     Status: None   Collection Time    05/08/13  4:06 PM      Result Value Range   Preg Test, Ur NEGATIVE  NEGATIVE  WET PREP, GENITAL     Status: Abnormal   Collection Time    05/08/13  4:55 PM      Result Value Range   Yeast Wet Prep HPF POC MANY (*) NONE SEEN   Trich, Wet Prep NONE SEEN  NONE SEEN   Clue Cells Wet Prep HPF POC NONE SEEN  NONE SEEN   WBC, Wet Prep HPF POC MANY (*) NONE SEEN    Assessment and Plan  persistant yeast vaginitis- Diflucan 150mg  in MAU; repeat every 3 days until sx resolved- re-evaluate in GYN clinic Right labial?bartholin gland cyst- f/u in GYN clinic Irregular menses- f/u in clinic  [facility] 05/08/2013, 5:29 PM

## 2013-05-10 LAB — URINE CULTURE: Culture: NO GROWTH

## 2013-05-23 ENCOUNTER — Encounter: Payer: Self-pay | Admitting: Obstetrics and Gynecology

## 2013-05-23 ENCOUNTER — Ambulatory Visit (INDEPENDENT_AMBULATORY_CARE_PROVIDER_SITE_OTHER): Payer: Self-pay | Admitting: Obstetrics and Gynecology

## 2013-05-23 VITALS — BP 104/72 | HR 83 | Temp 97.4°F | Ht 61.5 in | Wt 100.9 lb

## 2013-05-23 DIAGNOSIS — N926 Irregular menstruation, unspecified: Secondary | ICD-10-CM

## 2013-05-23 LAB — POCT PREGNANCY, URINE: Preg Test, Ur: NEGATIVE

## 2013-05-23 MED ORDER — NORGESTIMATE-ETH ESTRADIOL 0.25-35 MG-MCG PO TABS: 1.0000 | ORAL_TABLET | Freq: Every day | ORAL | Status: DC

## 2013-05-23 NOTE — Patient Instructions (Signed)
Candida Infection, Adult A candida infection (also called yeast, fungus and Monilia infection) is an overgrowth of yeast that can occur anywhere on the body. A yeast infection commonly occurs in warm, moist body areas. Usually, the infection remains localized but can spread to become a systemic infection. A yeast infection may be a sign of a more severe disease such as diabetes, leukemia, or AIDS. A yeast infection can occur in both men and women. In women, Candida vaginitis is a vaginal infection. It is one of the most common causes of vaginitis. Men usually do not have symptoms or know they have an infection until other problems develop. Men may find out they have a yeast infection because their sex partner has a yeast infection. Uncircumcised men are more likely to get a yeast infection than circumcised men. This is because the uncircumcised glans is not exposed to air and does not remain as dry as that of a circumcised glans. Older adults may develop yeast infections around dentures. CAUSES  Women  Antibiotics.  Steroid medication taken for a long time.  Being overweight (obese).  Diabetes.  Poor immune condition.  Certain serious medical conditions.  Immune suppressive medications for organ transplant patients.  Chemotherapy.  Pregnancy.  Menstration.  Stress and fatigue.  Intravenous drug use.  Oral contraceptives.  Wearing tight-fitting clothes in the crotch area.  Catching it from a sex partner who has a yeast infection.  Spermicide.  Intravenous, urinary, or other catheters. Men  Catching it from a sex partner who has a yeast infection.  Having oral or anal sex with a person who has the infection.  Spermicide.  Diabetes.  Antibiotics.  Poor immune system.  Medications that suppress the immune system.  Intravenous drug use.  Intravenous, urinary, or other catheters. SYMPTOMS  Women  Thick, white vaginal discharge.  Vaginal itching.  Redness and  swelling in and around the vagina.  Irritation of the lips of the vagina and perineum.  Blisters on the vaginal lips and perineum.  Painful sexual intercourse.  Low blood sugar (hypoglycemia).  Painful urination.  Bladder infections.  Intestinal problems such as constipation, indigestion, bad breath, bloating, increase in gas, diarrhea, or loose stools. Men  Men may develop intestinal problems such as constipation, indigestion, bad breath, bloating, increase in gas, diarrhea, or loose stools.  Dry, cracked skin on the penis with itching or discomfort.  Jock itch.  Dry, flaky skin.  Athlete's foot.  Hypoglycemia. DIAGNOSIS  Women  A history and an exam are performed.  The discharge may be examined under a microscope.  A culture may be taken of the discharge. Men  A history and an exam are performed.  Any discharge from the penis or areas of cracked skin will be looked at under the microscope and cultured.  Stool samples may be cultured. TREATMENT  Women  Vaginal antifungal suppositories and creams.  Medicated creams to decrease irritation and itching on the outside of the vagina.  Warm compresses to the perineal area to decrease swelling and discomfort.  Oral antifungal medications.  Medicated vaginal suppositories or cream for repeated or recurrent infections.  Wash and dry the irritation areas before applying the cream.  Eating yogurt with lactobacillus may help with prevention and treatment.  Sometimes painting the vagina with gentian violet solution may help if creams and suppositories do not work. Men  Antifungal creams and oral antifungal medications.  Sometimes treatment must continue for 30 days after the symptoms go away to prevent recurrence. HOME CARE   INSTRUCTIONS  Women  Use cotton underwear and avoid tight-fitting clothing.  Avoid colored, scented toilet paper and deodorant tampons or pads.  Do not douche.  Keep your diabetes  under control.  Finish all the prescribed medications.  Keep your skin clean and dry.  Consume milk or yogurt with lactobacillus active culture regularly. If you get frequent yeast infections and think that is what the infection is, there are over-the-counter medications that you can get. If the infection does not show healing in 3 days, talk to your caregiver.  Tell your sex partner you have a yeast infection. Your partner may need treatment also, especially if your infection does not clear up or recurs. Men  Keep your skin clean and dry.  Keep your diabetes under control.  Finish all prescribed medications.  Tell your sex partner that you have a yeast infection so they can be treated if necessary. SEEK MEDICAL CARE IF:   Your symptoms do not clear up or worsen in one week after treatment.  You have an oral temperature above 102 F (38.9 C).  You have trouble swallowing or eating for a prolonged time.  You develop blisters on and around your vagina.  You develop vaginal bleeding and it is not your menstrual period.  You develop abdominal pain.  You develop intestinal problems as mentioned above.  You get weak or lightheaded.  You have painful or increased urination.  You have pain during sexual intercourse. MAKE SURE YOU:   Understand these instructions.  Will watch your condition.  Will get help right away if you are not doing well or get worse. Document Released: 11/02/2004 Document Revised: 12/18/2011 Document Reviewed: 02/14/2010 Exit[employer] Patient Information 2014 [employer].  

## 2013-05-23 NOTE — Progress Notes (Signed)
Here for follow up. States took 1 diflucan at hospital, then 2 after that , did not pick up last refill.

## 2013-05-23 NOTE — Progress Notes (Signed)
  Subjective:    Patient ID: Marie Holt, female    DOB: September 20, 1991, 22 y.o.   MRN: [id number]  HPI 22 yo G1P1 presenting today as an MAU follow up for evaluation of persistent vaginal discharge s/p diflucan treatment. Patient states that she has had recurrent yeast infections over the past few years accompanied by copious amount of vaginal discharge. Patient states she last took diflucan in early August and is not experiencing a vaginal discharge for which she is concerned. She denies pruritis or odor. Patient also reports irregular menses since onset of menarche at the age of 41. She is sexually active not using any contraception. Patient was started on OCP and depo-provera in the past but self-discontinued. Discussed risk of pregnancy even with oligomenorrhea.  Past Medical History  Diagnosis Date  . Asthma   . Right ureteral stone   . Anxiety   . Yeast infection    Past Surgical History  Procedure Laterality Date  . Cystoscopy w/ retrogrades  06/03/2012    Procedure: CYSTOSCOPY WITH RETROGRADE PYELOGRAM;  Surgeon: Valetta Fuller, MD;  Location: [facility];  Service: Urology;  Laterality: Right;   Family History  Problem Relation Age of Onset  . Anesthesia problems Neg Hx   . Von Willebrand disease Sister   . Hypertension Maternal Grandmother   . Cancer Paternal Grandmother     unknown origin  . Cancer Paternal Grandfather     unknown origin  . Hypertension Mother   . Cancer Father     lung   History  Substance Use Topics  . Smoking status: Never Smoker   . Smokeless tobacco: Never Used  . Alcohol Use: Yes     Comment: occasional      Review of Systems  All other systems reviewed and are negative.       Objective:   Physical Exam  GENERAL: Well-developed, well-nourished female in no acute distress.  ABDOMEN: Soft, nontender, nondistended. No organomegaly. PELVIC: Normal external female genitalia. Vagina is pink and rugated.  Normal discharge.  Normal appearing cervix. Uterus is normal in size. No adnexal mass or tenderness. EXTREMITIES: No cyanosis, clubbing, or edema, 2+ distal pulses.     Assessment & Plan:  22 yo G1P1 here   - wet prep collected -Discussed birth control options to help regulate cycles and use of LARC such as Mirena IUD- Patient opted for OCP. Rx Sprintec provided. No risk factors or contraindications for use of OCP. She has used them in the past without any issues.  - Patient will be contacted with any abnormal results - RTC prn

## 2013-05-24 LAB — WET PREP, GENITAL
Clue Cells Wet Prep HPF POC: NONE SEEN
Trich, Wet Prep: NONE SEEN
WBC, Wet Prep HPF POC: NONE SEEN
Yeast Wet Prep HPF POC: NONE SEEN

## 2013-07-21 ENCOUNTER — Inpatient Hospital Stay (HOSPITAL_COMMUNITY)
Admission: AD | Admit: 2013-07-21 | Discharge: 2013-07-21 | Disposition: A | Discharge status: Home / Self Care | Payer: Self-pay | Source: Ambulatory Visit | Attending: Obstetrics & Gynecology | Admitting: Obstetrics & Gynecology

## 2013-07-21 ENCOUNTER — Encounter (HOSPITAL_COMMUNITY): Payer: Self-pay | Admitting: *Deleted

## 2013-07-21 DIAGNOSIS — O210 Mild hyperemesis gravidarum: Secondary | ICD-10-CM | POA: Insufficient documentation

## 2013-07-21 LAB — URINALYSIS, ROUTINE W REFLEX MICROSCOPIC
Bilirubin Urine: NEGATIVE
Glucose, UA: NEGATIVE mg/dL
Ketones, ur: >80 mg/dL — AB
Leukocytes, UA: NEGATIVE
Protein, ur: NEGATIVE mg/dL
pH: 6 (ref 5.0–8.0)

## 2013-07-21 LAB — URINE MICROSCOPIC-ADD ON

## 2013-07-21 MED ORDER — PYRIDOXINE HCL 100 MG/ML IJ SOLN
100.0000 mg | Freq: Once | INTRAMUSCULAR | Status: AC
  Administered 2013-07-21: 100 mg via INTRAVENOUS
  Filled 2013-07-21: qty 1

## 2013-07-21 MED ORDER — METOCLOPRAMIDE HCL 10 MG PO TABS: 10.0000 mg | ORAL_TABLET | Freq: Four times a day (QID) | ORAL | Status: DC

## 2013-07-21 MED ORDER — SODIUM CHLORIDE 0.9 % IV SOLN
25.0000 mg | Freq: Once | INTRAVENOUS | Status: AC
  Administered 2013-07-21: 25 mg via INTRAVENOUS
  Filled 2013-07-21: qty 1

## 2013-07-21 MED ORDER — LACTATED RINGERS IV SOLN
INTRAVENOUS | Status: DC
  Administered 2013-07-21: 16:00:00 via INTRAVENOUS

## 2013-07-21 MED ORDER — PROMETHAZINE HCL 25 MG PO TABS: 25.0000 mg | ORAL_TABLET | Freq: Four times a day (QID) | ORAL | Status: DC | PRN

## 2013-07-21 MED ORDER — METOCLOPRAMIDE HCL 5 MG/ML IJ SOLN
10.0000 mg | Freq: Once | INTRAMUSCULAR | Status: AC
  Administered 2013-07-21: 10 mg via INTRAVENOUS
  Filled 2013-07-21: qty 2

## 2013-07-21 NOTE — MAU Provider Note (Signed)
History     CSN: [id number]  Arrival date and time: 07/21/13 1039   First Provider Initiated Contact with Patient 07/21/13 1303      Chief Complaint  Patient presents with  . Emesis   HPI This is a 22 y.o. female at early gestation. Has irregular cycles, so unsure of dates. Denies pain or bleeding. States she had this happen last pregnancy.   RN Note:  Been throwing up everything since yesterday afternoon. No one else at home is sick. +HPT 10/09.       OB History   Grav Para Term Preterm Abortions TAB SAB Ect Mult Living   2 1 1       1       Past Medical History  Diagnosis Date  . Asthma   . Right ureteral stone   . Anxiety   . Yeast infection     Past Surgical History  Procedure Laterality Date  . Cystoscopy w/ retrogrades  06/03/2012    Procedure: CYSTOSCOPY WITH RETROGRADE PYELOGRAM;  Surgeon: Valetta Fuller, MD;  Location: [facility];  Service: Urology;  Laterality: Right;    Family History  Problem Relation Age of Onset  . Anesthesia problems Neg Hx   . Von Willebrand disease Sister   . Hypertension Maternal Grandmother   . Cancer Paternal Grandmother     unknown origin  . Cancer Paternal Grandfather     unknown origin  . Hypertension Mother   . Cancer Father     lung    History  Substance Use Topics  . Smoking status: Never Smoker   . Smokeless tobacco: Never Used  . Alcohol Use: Yes     Comment: occasional    Allergies:  Allergies  Allergen Reactions  . Vicodin [Hydrocodone-Acetaminophen] Nausea And Vomiting    Prescriptions prior to admission  Medication Sig Dispense Refill  . albuterol (PROVENTIL HFA;VENTOLIN HFA) 108 (90 BASE) MCG/ACT inhaler Inhale 2 puffs into the lungs every 6 (six) hours as needed. For shortness of breath      . budesonide-formoterol (SYMBICORT) 160-4.5 MCG/ACT inhaler Inhale 2 puffs into the lungs as needed. SOB        Review of Systems  Constitutional: Negative for fever, chills and  malaise/fatigue.  Gastrointestinal: Positive for nausea and vomiting. Negative for abdominal pain, diarrhea and constipation.  Genitourinary: Negative for dysuria.  Neurological: Negative for dizziness.   Physical Exam   Blood pressure 108/61, pulse 86, temperature 97.9 F (36.6 C), temperature source Oral, resp. rate 18, height 5\' 2"  (1.575 m), weight 46.267 kg (102 lb), last menstrual period 03/30/2013.  Physical Exam  Constitutional: She is oriented to person, place, and time. She appears well-developed and well-nourished. No distress.  HENT:  Head: Normocephalic.  Cardiovascular: Normal rate.   Respiratory: Effort normal.  GI: Soft.  Musculoskeletal: Normal range of motion.  Neurological: She is alert and oriented to person, place, and time.  Skin: Skin is warm and dry.  Psychiatric: She has a normal mood and affect.    MAU Course  Procedures  MDM IV hydration given as well as antiemetics Good relief from above, wants to go home  Assessment and Plan  A:  Pregnancy       Nausea and vomiting of pregnancy  P:  Discharge home   Medication List         albuterol 108 (90 BASE) MCG/ACT inhaler  Commonly known as:  PROVENTIL HFA;VENTOLIN HFA  Inhale 2 puffs into the lungs every  6 (six) hours as needed. For shortness of breath     budesonide-formoterol 160-4.5 MCG/ACT inhaler  Commonly known as:  SYMBICORT  Inhale 2 puffs into the lungs as needed. SOB     metoCLOPramide 10 MG tablet  Commonly known as:  REGLAN  Take 1 tablet (10 mg total) by mouth 4 (four) times daily.     promethazine 25 MG tablet  Commonly known as:  PHENERGAN  Take 1 tablet (25 mg total) by mouth every 6 (six) hours as needed for nausea.           Follow up with [facility] (plans care at [facility]  [facility] 07/21/2013, 1:28 PM

## 2013-07-21 NOTE — MAU Note (Signed)
Been throwing up everything since yesterday afternoon.  No one else at home is sick.  +HPT  10/09.

## 2013-07-21 NOTE — MAU Note (Signed)
Pt states that she feels much better after the reglan

## 2013-07-22 NOTE — MAU Provider Note (Signed)
Attestation of Attending Supervision of Advanced Practitioner (CNM/NP): Evaluation and management procedures were performed by the Advanced Practitioner under my supervision and collaboration.  I have reviewed the Advanced Practitioner's note and chart, and I agree with the management and plan.  Aniyha Tate 07/22/2013 7:05 AM

## 2013-07-31 ENCOUNTER — Inpatient Hospital Stay (HOSPITAL_COMMUNITY)
Admission: AD | Admit: 2013-07-31 | Discharge: 2013-07-31 | DRG: 781 | Disposition: A | Discharge status: Home / Self Care | Payer: Self-pay | Source: Ambulatory Visit | Attending: Obstetrics & Gynecology | Admitting: Obstetrics & Gynecology

## 2013-07-31 ENCOUNTER — Encounter (HOSPITAL_COMMUNITY): Payer: Self-pay | Admitting: *Deleted

## 2013-07-31 DIAGNOSIS — O210 Mild hyperemesis gravidarum: Principal | ICD-10-CM | POA: Diagnosis present

## 2013-07-31 DIAGNOSIS — O219 Vomiting of pregnancy, unspecified: Secondary | ICD-10-CM

## 2013-07-31 LAB — URINALYSIS, ROUTINE W REFLEX MICROSCOPIC
Glucose, UA: NEGATIVE mg/dL
Ketones, ur: NEGATIVE mg/dL
Leukocytes, UA: NEGATIVE
Nitrite: NEGATIVE
Specific Gravity, Urine: 1.02 (ref 1.005–1.030)
pH: 8.5 — ABNORMAL HIGH (ref 5.0–8.0)

## 2013-07-31 MED ORDER — ONDANSETRON 8 MG PO TBDP: 8.0000 mg | ORAL_TABLET | Freq: Three times a day (TID) | ORAL | Status: DC | PRN

## 2013-07-31 MED ORDER — ONDANSETRON 8 MG PO TBDP
8.0000 mg | ORAL_TABLET | Freq: Once | ORAL | Status: AC
  Administered 2013-07-31: 8 mg via ORAL
  Filled 2013-07-31: qty 1

## 2013-07-31 NOTE — MAU Provider Note (Signed)
History     CSN: [id number]  Arrival date and time: 07/31/13 1329   First Provider Initiated Contact with Patient 07/31/13 1501      Chief Complaint  Patient presents with  . Morning Sickness   HPI Marie Holt is 22 y.o. G2P1001 Unknown weeks presenting with nausea and vomiting in early pregnancy.  Unsure of exact LMP, but thinks it could be the end of August.  This would make her [redacted]w[redacted]d gestation.   She was seen here 1 week ago for same sxs.  Treated with Reglan and Phenergan.  Both meds make her vomit.  Would like a different medication.  She denies lower abdominal pain and vaginal bleeding/discharge.  She plans care with [facility] when insurance in effect.  Needs verification letter.   Past Medical History  Diagnosis Date  . Asthma   . Right ureteral stone   . Anxiety   . Yeast infection     Past Surgical History  Procedure Laterality Date  . Cystoscopy w/ retrogrades  06/03/2012    Procedure: CYSTOSCOPY WITH RETROGRADE PYELOGRAM;  Surgeon: Valetta Fuller, MD;  Location: [facility];  Service: Urology;  Laterality: Right;    Family History  Problem Relation Age of Onset  . Anesthesia problems Neg Hx   . Von Willebrand disease Sister   . Hypertension Maternal Grandmother   . Cancer Paternal Grandmother     unknown origin  . Cancer Paternal Grandfather     unknown origin  . Hypertension Mother   . Cancer Father     lung    History  Substance Use Topics  . Smoking status: Never Smoker   . Smokeless tobacco: Never Used  . Alcohol Use: Yes     Comment: occasional    Allergies:  Allergies  Allergen Reactions  . Vicodin [Hydrocodone-Acetaminophen] Nausea And Vomiting    Prescriptions prior to admission  Medication Sig Dispense Refill  . metoCLOPramide (REGLAN) 10 MG tablet Take 1 tablet (10 mg total) by mouth 4 (four) times daily.  30 tablet  1  . promethazine (PHENERGAN) 25 MG tablet Take 1 tablet (25 mg total) by mouth every 6 (six) hours as needed  for nausea.  30 tablet  0  . albuterol (PROVENTIL HFA;VENTOLIN HFA) 108 (90 BASE) MCG/ACT inhaler Inhale 2 puffs into the lungs every 6 (six) hours as needed. For shortness of breath      . budesonide-formoterol (SYMBICORT) 160-4.5 MCG/ACT inhaler Inhale 2 puffs into the lungs as needed. SOB        Review of Systems  Constitutional: Negative for fever and chills.  Gastrointestinal: Positive for nausea and vomiting. Negative for abdominal pain (neg for lower abdominal pain.  Upper abdominal pain occurs with vomiting.).  Genitourinary: Negative for dysuria, urgency, frequency and hematuria.       Neg for vaginal discharge or bleeding   Physical Exam   Blood pressure 118/61, pulse 96, temperature 97.9 F (36.6 C), temperature source Oral, resp. rate 18, height 5\' 2"  (1.575 m), weight 101 lb (45.813 kg).  Physical Exam  Constitutional: She is oriented to person, place, and time. She appears well-developed and well-nourished. No distress.  HENT:  Head: Normocephalic.  Neck: Normal range of motion.  Cardiovascular: Normal rate.   Respiratory: Effort normal.  GI: There is no tenderness.  Genitourinary:  Not indicated  Neurological: She is alert and oriented to person, place, and time.  Skin: Skin is warm and dry.  Psychiatric: She has a normal mood  and affect. Her behavior is normal.   Results for orders placed during the hospital encounter of 07/31/13 (from the past 24 hour(s))  URINALYSIS, ROUTINE W REFLEX MICROSCOPIC     Status: Abnormal   Collection Time    07/31/13  1:55 PM      Result Value Range   Color, Urine YELLOW  YELLOW   APPearance CLOUDY (*) CLEAR   Specific Gravity, Urine 1.020  1.005 - 1.030   pH 8.5 (*) 5.0 - 8.0   Glucose, UA NEGATIVE  NEGATIVE mg/dL   Hgb urine dipstick NEGATIVE  NEGATIVE   Bilirubin Urine NEGATIVE  NEGATIVE   Ketones, ur NEGATIVE  NEGATIVE mg/dL   Protein, ur NEGATIVE  NEGATIVE mg/dL   Urobilinogen, UA 0.2  0.0 - 1.0 mg/dL   Nitrite NEGATIVE   NEGATIVE   Leukocytes, UA NEGATIVE  NEGATIVE    MAU Course  Procedures  MDM Zofran 8mg  ODT given in MAU  Assessment and Plan  A:  Nausea and vomiting in early pregnancy  P:  Letter of verification given to patient      Rx for Zofran      Make appt with [facility] for prenatal care  Icarus Partch,EVE M 07/31/2013, 4:00 PM

## 2013-07-31 NOTE — MAU Note (Signed)
Ongoing problem with nausea and vomiting, medicine not working.

## 2013-08-06 NOTE — MAU Provider Note (Signed)
Attestation of Attending Supervision of Advanced Practitioner (CNM/NP): Evaluation and management procedures were performed by the Advanced Practitioner under my supervision and collaboration. I have reviewed the Advanced Practitioner's note and chart, and I agree with the management and plan.  Breasia Karges H. 9:42 AM   

## 2013-08-14 ENCOUNTER — Other Ambulatory Visit: Payer: Self-pay

## 2013-09-26 LAB — OB RESULTS CONSOLE GC/CHLAMYDIA
Chlamydia: NEGATIVE
Gonorrhea: NEGATIVE

## 2013-09-26 LAB — OB RESULTS CONSOLE HEPATITIS B SURFACE ANTIGEN: Hepatitis B Surface Ag: NEGATIVE

## 2013-09-26 LAB — OB RESULTS CONSOLE RUBELLA ANTIBODY, IGM: Rubella: IMMUNE

## 2013-10-09 NOTE — L&D Delivery Note (Addendum)
Delivery Note At 4:12 AM a viable female was delivered via Vaginal, Spontaneous Delivery (Presentation: Right Occiput Anterior).  APGAR: 9, 9; weight pending .   Placenta status: Intact, Spontaneous.  Cord: 3 vessels with the following complications: None.  Cord pH: n/a  Anesthesia: Epidural  Episiotomy: None Lacerations: None Suture Repair: 3.0 vicryl used to repair vagina after excision of redundant vaginal wall tissue on the lower edges of the introitus bilaterally. Est. Blood Loss (mL): 300 cc  Mom to postpartum.  Baby to Couplet care / Skin to Skin.  Purcell Nails 03/14/2014, 4:34 AM

## 2013-10-16 LAB — OB RESULTS CONSOLE RPR: RPR: NONREACTIVE

## 2013-10-16 LAB — OB RESULTS CONSOLE HIV ANTIBODY (ROUTINE TESTING): HIV: NONREACTIVE

## 2013-10-16 LAB — OB RESULTS CONSOLE ANTIBODY SCREEN: ANTIBODY SCREEN: NEGATIVE

## 2013-10-16 LAB — OB RESULTS CONSOLE TSH: TSH: 1.897

## 2013-11-08 ENCOUNTER — Encounter (HOSPITAL_COMMUNITY): Payer: Self-pay

## 2013-11-08 ENCOUNTER — Observation Stay (HOSPITAL_COMMUNITY)
Admission: AD | Admit: 2013-11-08 | Discharge: 2013-11-11 | DRG: 781 | Disposition: A | Discharge status: Home / Self Care | Payer: Medicaid Other | Source: Ambulatory Visit | Attending: Obstetrics and Gynecology | Admitting: Obstetrics and Gynecology

## 2013-11-08 DIAGNOSIS — O4690 Antepartum hemorrhage, unspecified, unspecified trimester: Principal | ICD-10-CM | POA: Diagnosis present

## 2013-11-08 DIAGNOSIS — D649 Anemia, unspecified: Secondary | ICD-10-CM | POA: Diagnosis present

## 2013-11-08 DIAGNOSIS — O4700 False labor before 37 completed weeks of gestation, unspecified trimester: Secondary | ICD-10-CM | POA: Diagnosis present

## 2013-11-08 DIAGNOSIS — O99019 Anemia complicating pregnancy, unspecified trimester: Secondary | ICD-10-CM | POA: Diagnosis present

## 2013-11-08 LAB — URINALYSIS, ROUTINE W REFLEX MICROSCOPIC
Bilirubin Urine: NEGATIVE
Glucose, UA: NEGATIVE mg/dL
Ketones, ur: NEGATIVE mg/dL
Leukocytes, UA: NEGATIVE
NITRITE: NEGATIVE
Protein, ur: NEGATIVE mg/dL
SPECIFIC GRAVITY, URINE: 1.015 (ref 1.005–1.030)
UROBILINOGEN UA: 0.2 mg/dL (ref 0.0–1.0)
pH: 7 (ref 5.0–8.0)

## 2013-11-08 LAB — URINE MICROSCOPIC-ADD ON

## 2013-11-08 LAB — WET PREP, GENITAL
Clue Cells Wet Prep HPF POC: NONE SEEN
TRICH WET PREP: NONE SEEN
YEAST WET PREP: NONE SEEN

## 2013-11-08 NOTE — MAU Note (Signed)
[facility] and saw blood on toilet and then blood started pouring out. I'm not  Having pain but pelvic pressure that is constant

## 2013-11-08 NOTE — MAU Provider Note (Signed)
History  CSN: [id number]  Arrival date and time: 11/08/13 2158   None     Chief Complaint  Patient presents with  . Vaginal Bleeding   HPI Pt presents to MAU with c/o "gush of blood" from her vagina just before admission.  Pt reports light bleeding with wiping only approx 2hrs prior to admission but then had "gush" when sitting on toliet after that and reports enough got onto underwear.  Denies cramping but c/o increased vaginal pressure and pain.  No hx of preterm labor.  Reports she did have intercourse this morning.  She denies any itching, burning or odor.  Reports active fetus.   OB History   Grav Para Term Preterm Abortions TAB SAB Ect Mult Living   2 1 1       1       Past Medical History  Diagnosis Date  . Asthma   . Right ureteral stone   . Anxiety   . Yeast infection     Past Surgical History  Procedure Laterality Date  . Cystoscopy w/ retrogrades  06/03/2012    Procedure: CYSTOSCOPY WITH RETROGRADE PYELOGRAM;  Surgeon: Valetta Fulleravid S Grapey, MD;  [facility] [place];  Service: Urology;  Laterality: Right;    Family History  Problem Relation Age of Onset  . Anesthesia problems Neg Hx   . Von Willebrand disease Sister   . Hypertension Maternal Grandmother   . Cancer Paternal Grandmother     unknown origin  . Cancer Paternal Grandfather     unknown origin  . Hypertension Mother   . Cancer Father     lung    History  Substance Use Topics  . Smoking status: Never Smoker   . Smokeless tobacco: Never Used  . Alcohol Use: No     Comment: occasional    Allergies:  Allergies  Allergen Reactions  . Vicodin [Hydrocodone-Acetaminophen] Nausea And Vomiting    Prescriptions prior to admission  Medication Sig Dispense Refill  . Prenatal Vit-Fe Fumarate-FA (PRENATAL MULTIVITAMIN) TABS tablet Take 1 tablet by mouth daily at 12 noon.      Marland Kitchen. albuterol (PROVENTIL HFA;VENTOLIN HFA) 108 (90 BASE) MCG/ACT inhaler Inhale 2 puffs into the lungs every 6 (six)  hours as needed. For shortness of breath      . budesonide-formoterol (SYMBICORT) 160-4.5 MCG/ACT inhaler Inhale 2 puffs into the lungs as needed. SOB        Review of Systems  Constitutional: Negative.   HENT: Negative.   Respiratory: Negative.   Cardiovascular: Negative.   Gastrointestinal: Negative.   Genitourinary: Negative.   Musculoskeletal: Negative.   Skin: Negative.   Neurological: Negative.   Endo/Heme/Allergies: Negative.   Psychiatric/Behavioral: Negative.    Physical Exam   Blood pressure 115/58, pulse 93, temperature 98 F (36.7 C), resp. rate 20, height 5\' 2"  (1.575 m), weight 50.44 kg (111 lb 3.2 oz).  Physical Exam  Constitutional: She is oriented to person, place, and time. She appears well-developed and well-nourished.  HENT:  Head: Normocephalic and atraumatic.  Right Ear: External ear normal.  Left Ear: External ear normal.  Nose: Nose normal.  Eyes: Conjunctivae are normal. Pupils are equal, round, and reactive to light.  Neck: Normal range of motion. Neck supple.  Cardiovascular: Normal rate, regular rhythm and intact distal pulses.   Respiratory: Effort normal and breath sounds normal.  GI: Soft. Bowel sounds are normal.  Genitourinary: Vagina normal and uterus normal.  Ext gent WNL.  BUS neg.  Sm amt dk  red blood and small clot noted in vagina.  Cx without lesions noted and visually closed. SVE:  Closed/Thick/Post/Firm/Vtx.   Musculoskeletal: Normal range of motion.  Neurological: She is alert and oriented to person, place, and time. She has normal reflexes.  Skin: Skin is warm and dry.  Psychiatric: She has a normal mood and affect. Her behavior is normal.   FHR baseline 145; Variability-moderate; Appropriate for gestational age with 10 x 10 accels present.  No decels noted. No UCs noted on toco.  MAU Course  Procedures Ultrasound with ? Subchorionic hemorrhage vs placental lake UDS - Neg GC/Chl-pending  Assessment and Plan  IUP at 22w  6d Vaginal bleeding  Per consult with Dr. Stefano Gaul, will admit to Antepartum for observation  Jairus Tonne O. 11/08/2013, 11:02 PM

## 2013-11-09 ENCOUNTER — Inpatient Hospital Stay (HOSPITAL_COMMUNITY): Payer: Medicaid Other

## 2013-11-09 ENCOUNTER — Encounter (HOSPITAL_COMMUNITY): Payer: Self-pay | Admitting: General Practice

## 2013-11-09 DIAGNOSIS — O4690 Antepartum hemorrhage, unspecified, unspecified trimester: Secondary | ICD-10-CM | POA: Diagnosis present

## 2013-11-09 LAB — RAPID URINE DRUG SCREEN, HOSP PERFORMED
Amphetamines: NOT DETECTED
BARBITURATES: NOT DETECTED
Benzodiazepines: NOT DETECTED
Cocaine: NOT DETECTED
Opiates: NOT DETECTED
TETRAHYDROCANNABINOL: NOT DETECTED

## 2013-11-09 LAB — CBC
HCT: 34.7 % — ABNORMAL LOW (ref 36.0–46.0)
HEMOGLOBIN: 11.8 g/dL — AB (ref 12.0–15.0)
MCH: 31.6 pg (ref 26.0–34.0)
MCHC: 34 g/dL (ref 30.0–36.0)
MCV: 93 fL (ref 78.0–100.0)
Platelets: 200 10*3/uL (ref 150–400)
RBC: 3.73 MIL/uL — AB (ref 3.87–5.11)
RDW: 13.5 % (ref 11.5–15.5)
WBC: 8.7 10*3/uL (ref 4.0–10.5)

## 2013-11-09 MED ORDER — PRENATAL MULTIVITAMIN CH
1.0000 | ORAL_TABLET | Freq: Every day | ORAL | Status: DC
  Administered 2013-11-09 – 2013-11-11 (×3): 1 via ORAL
  Filled 2013-11-09 (×3): qty 1

## 2013-11-09 MED ORDER — BUDESONIDE-FORMOTEROL FUMARATE 160-4.5 MCG/ACT IN AERO: 2.0000 | INHALATION_SPRAY | RESPIRATORY_TRACT | Status: DC | PRN

## 2013-11-09 MED ORDER — ALBUTEROL SULFATE (2.5 MG/3ML) 0.083% IN NEBU
2.5000 mg | INHALATION_SOLUTION | Freq: Four times a day (QID) | RESPIRATORY_TRACT | Status: DC | PRN
  Filled 2013-11-09: qty 3

## 2013-11-09 MED ORDER — DOCUSATE SODIUM 100 MG PO CAPS
100.0000 mg | ORAL_CAPSULE | Freq: Every day | ORAL | Status: DC
  Administered 2013-11-09 – 2013-11-11 (×3): 100 mg via ORAL
  Filled 2013-11-09 (×3): qty 1

## 2013-11-09 MED ORDER — SODIUM CHLORIDE 0.9 % IJ SOLN: 3.0000 mL | Freq: Two times a day (BID) | INTRAMUSCULAR | Status: DC

## 2013-11-09 MED ORDER — ZOLPIDEM TARTRATE 5 MG PO TABS: 5.0000 mg | ORAL_TABLET | Freq: Every evening | ORAL | Status: DC | PRN

## 2013-11-09 MED ORDER — ACETAMINOPHEN 325 MG PO TABS
650.0000 mg | ORAL_TABLET | ORAL | Status: DC | PRN
  Administered 2013-11-10: 650 mg via ORAL
  Filled 2013-11-09: qty 2

## 2013-11-09 MED ORDER — CALCIUM CARBONATE ANTACID 500 MG PO CHEW
2.0000 | CHEWABLE_TABLET | ORAL | Status: DC | PRN
Start: 2013-11-09 — End: 2013-11-11

## 2013-11-09 NOTE — Progress Notes (Signed)
23 y.o. year old female,at 7859w4d gestation.  SUBJECTIVE:  The patient denies abdominal pain. Her bleeding has decreased.  OBJECTIVE:  BP 106/58  Pulse 84  Temp(Src) 98.3 F (36.8 C) (Oral)  Resp 18  Ht 5\' 2"  (1.575 m)  Wt 111 lb (50.349 kg)  BMI 20.30 kg/m2  Fetal Heart Tones:  Stable  Contractions:          Few  Abdomen soft and nontender.  Results for orders placed during the hospital encounter of 11/08/13 (from the past 24 hour(s))  WET PREP, GENITAL     Status: Abnormal   Collection Time    11/08/13 10:47 PM      Result Value Range   Yeast Wet Prep HPF POC NONE SEEN  NONE SEEN   Trich, Wet Prep NONE SEEN  NONE SEEN   Clue Cells Wet Prep HPF POC NONE SEEN  NONE SEEN   WBC, Wet Prep HPF POC FEW (*) NONE SEEN  URINE RAPID DRUG SCREEN (HOSP PERFORMED)     Status: None   Collection Time    11/08/13 11:01 PM      Result Value Range   Opiates NONE DETECTED  NONE DETECTED   Cocaine NONE DETECTED  NONE DETECTED   Benzodiazepines NONE DETECTED  NONE DETECTED   Amphetamines NONE DETECTED  NONE DETECTED   Tetrahydrocannabinol NONE DETECTED  NONE DETECTED   Barbiturates NONE DETECTED  NONE DETECTED  URINALYSIS, ROUTINE W REFLEX MICROSCOPIC     Status: Abnormal   Collection Time    11/08/13 11:01 PM      Result Value Range   Color, Urine YELLOW  YELLOW   APPearance CLOUDY (*) CLEAR   Specific Gravity, Urine 1.015  1.005 - 1.030   pH 7.0  5.0 - 8.0   Glucose, UA NEGATIVE  NEGATIVE mg/dL   Hgb urine dipstick LARGE (*) NEGATIVE   Bilirubin Urine NEGATIVE  NEGATIVE   Ketones, ur NEGATIVE  NEGATIVE mg/dL   Protein, ur NEGATIVE  NEGATIVE mg/dL   Urobilinogen, UA 0.2  0.0 - 1.0 mg/dL   Nitrite NEGATIVE  NEGATIVE   Leukocytes, UA NEGATIVE  NEGATIVE  URINE MICROSCOPIC-ADD ON     Status: Abnormal   Collection Time    11/08/13 11:01 PM      Result Value Range   Squamous Epithelial / LPF FEW (*) RARE   WBC, UA 0-2  <3 WBC/hpf   RBC / HPF 0-2  <3 RBC/hpf   Bacteria, UA RARE   RARE   Urine-Other AMORPHOUS URATES/PHOSPHATES    CBC     Status: Abnormal   Collection Time    11/09/13  3:04 AM      Result Value Range   WBC 8.7  4.0 - 10.5 K/uL   RBC 3.73 (*) 3.87 - 5.11 MIL/uL   Hemoglobin 11.8 (*) 12.0 - 15.0 g/dL   HCT 16.134.7 (*) 09.636.0 - 04.546.0 %   MCV 93.0  78.0 - 100.0 fL   MCH 31.6  26.0 - 34.0 pg   MCHC 34.0  30.0 - 36.0 g/dL   RDW 40.913.5  81.111.5 - 91.415.5 %   Platelets 200  150 - 400 K/uL     ASSESSMENT:  3259w4d Weeks Pregnancy  Vaginal bleeding  Questionable abruption  Anemia  PLAN:  Continue in-hospital observation as long as the patient has any bleeding.  Leonard SchwartzArthur Vernon Tayja Manzer, M.D.

## 2013-11-09 NOTE — Plan of Care (Signed)
Problem: Consults Goal: Birthing Suites Patient Information Press F2 to bring up selections list  Outcome: Not Applicable Date Met:  36/14/43  Pt less than 37 weeks

## 2013-11-09 NOTE — Care Management Utilization Note (Signed)
UR complete    Hayward Rylander,MSN,RN [phone] 

## 2013-11-09 NOTE — Plan of Care (Signed)
Problem: Consults Goal: Birthing Suites Patient Information Press F2 to bring up selections list  Outcome: Not Met (add Reason) Birthing suite admission unlikely.

## 2013-11-09 NOTE — Progress Notes (Signed)
To us via wc.

## 2013-11-10 ENCOUNTER — Ambulatory Visit (HOSPITAL_COMMUNITY): Payer: Medicaid Other

## 2013-11-10 ENCOUNTER — Observation Stay (HOSPITAL_COMMUNITY): Payer: Medicaid Other

## 2013-11-10 LAB — CBC
HEMATOCRIT: 32 % — AB (ref 36.0–46.0)
Hemoglobin: 10.9 g/dL — ABNORMAL LOW (ref 12.0–15.0)
MCH: 32 pg (ref 26.0–34.0)
MCHC: 34.1 g/dL (ref 30.0–36.0)
MCV: 93.8 fL (ref 78.0–100.0)
PLATELETS: 185 10*3/uL (ref 150–400)
RBC: 3.41 MIL/uL — ABNORMAL LOW (ref 3.87–5.11)
RDW: 13.5 % (ref 11.5–15.5)
WBC: 9.3 10*3/uL (ref 4.0–10.5)

## 2013-11-10 LAB — TYPE AND SCREEN
ABO/RH(D): O POS
ANTIBODY SCREEN: NEGATIVE

## 2013-11-10 LAB — GC/CHLAMYDIA PROBE AMP
CT Probe RNA: NEGATIVE
GC PROBE AMP APTIMA: NEGATIVE

## 2013-11-10 MED ORDER — NIFEDIPINE 10 MG PO CAPS
10.0000 mg | ORAL_CAPSULE | Freq: Four times a day (QID) | ORAL | Status: DC | PRN
  Administered 2013-11-10 (×2): 10 mg via ORAL
  Filled 2013-11-10 (×2): qty 1

## 2013-11-10 NOTE — Consult Note (Signed)
MFM Staff Consult Note  Impressions: Single IUP at 22 5/7 weeks in gestation complicated by presentation for due to vaginal bleeding EFW is at the 57th% No dysmorphic features A posterior placenta is noted without previa Previously noted marginal abruption/subchorionic hemorrhage is no longer apparent and likely passed during the observed albeit resolved bleeding episode Normal amniotic fluid volume  Recommendations: 1. bleeding and preterm labor precautions 2. follow up ultrasounds as clinically indicated 3. follow up prenatal care as per routine, noting there is no sonographically apparent indication for admission.    Time Spent: I spent in excess of 15 minutes in consultation with this patient to review records, evaluate her case, and provide her with an adequate discussion and education.  More than 50% of this time was spent in direct face-to-face counseling. It was a pleasure seeing your patient in the office today.  Thank you for consultation. Please do not hesitate to contact our service for any further questions.   Thank you,  Louann SjogrenJeffrey Morgan Gaynelle Arabianenney   Denney, Louann SjogrenJeffrey Morgan, MD, MS, FACOG Assistant Professor Section of Maternal-Fetal Medicine [facility] [employer]

## 2013-11-10 NOTE — Progress Notes (Signed)
Visited with pt while making rounds on the unit.  She is anxious to be home with her daughter (19 months), but seems to be coping well with the situation.  She has support from family, including FOB's mother who is watching her daughter while she is here.  Kathleen ArgueChaplain Katy Rowan Blaker Pager [phone] 3:18 PM   11/10/13 1500  Clinical Encounter Type  Visited With Patient  Visit Type Initial

## 2013-11-10 NOTE — Progress Notes (Signed)
Ur chart review completed.  

## 2013-11-10 NOTE — Progress Notes (Signed)
Patient ID: Marie Holt, female   DOB: July 05, 1991, 23 y.o.   MRN: [id number] Pt without complaints.  No leakage of fluid or VB.  Good FM  BP 100/56  Pulse 93  Temp(Src) 98.3 F (36.8 C) (Oral)  Resp 24  Ht 5\' 2"  (1.575 m)  Wt 111 lb (50.349 kg)  BMI 20.30 kg/m2  FHTS 145  Toco irregular, every 2-15 minutes  Pt in NAD CV RRR Lungs CTAB abd  Gravid soft and NT GU no vb EXt no calf tenderness Results for orders placed during the hospital encounter of 11/08/13 (from the past 72 hour(s))  WET PREP, GENITAL     Status: Abnormal   Collection Time    11/08/13 10:47 PM      Result Value Range   Yeast Wet Prep HPF POC NONE SEEN  NONE SEEN   Trich, Wet Prep NONE SEEN  NONE SEEN   Clue Cells Wet Prep HPF POC NONE SEEN  NONE SEEN   WBC, Wet Prep HPF POC FEW (*) NONE SEEN   Comment: FEW BACTERIA SEEN  URINE RAPID DRUG SCREEN (HOSP PERFORMED)     Status: None   Collection Time    11/08/13 11:01 PM      Result Value Range   Opiates NONE DETECTED  NONE DETECTED   Cocaine NONE DETECTED  NONE DETECTED   Benzodiazepines NONE DETECTED  NONE DETECTED   Amphetamines NONE DETECTED  NONE DETECTED   Tetrahydrocannabinol NONE DETECTED  NONE DETECTED   Barbiturates NONE DETECTED  NONE DETECTED   Comment:            DRUG SCREEN FOR MEDICAL PURPOSES     ONLY.  IF CONFIRMATION IS NEEDED     FOR ANY PURPOSE, NOTIFY LAB     WITHIN 5 DAYS.                LOWEST DETECTABLE LIMITS     FOR URINE DRUG SCREEN     Drug Class       Cutoff (ng/mL)     Amphetamine      1000     Barbiturate      200     Benzodiazepine   200     Tricyclics       300     Opiates          300     Cocaine          300     THC              50     Performed at [facility]  URINALYSIS, ROUTINE W REFLEX MICROSCOPIC     Status: Abnormal   Collection Time    11/08/13 11:01 PM      Result Value Range   Color, Urine YELLOW  YELLOW   APPearance CLOUDY (*) CLEAR   Specific Gravity, Urine 1.015  1.005 - 1.030   pH  7.0  5.0 - 8.0   Glucose, UA NEGATIVE  NEGATIVE mg/dL   Hgb urine dipstick LARGE (*) NEGATIVE   Bilirubin Urine NEGATIVE  NEGATIVE   Ketones, ur NEGATIVE  NEGATIVE mg/dL   Protein, ur NEGATIVE  NEGATIVE mg/dL   Urobilinogen, UA 0.2  0.0 - 1.0 mg/dL   Nitrite NEGATIVE  NEGATIVE   Leukocytes, UA NEGATIVE  NEGATIVE  URINE MICROSCOPIC-ADD ON     Status: Abnormal   Collection Time    11/08/13 11:01 PM      Result  Value Range   Squamous Epithelial / LPF FEW (*) RARE   WBC, UA 0-2  <3 WBC/hpf   RBC / HPF 0-2  <3 RBC/hpf   Bacteria, UA RARE  RARE   Urine-Other AMORPHOUS URATES/PHOSPHATES    CBC     Status: Abnormal   Collection Time    11/09/13  3:04 AM      Result Value Range   WBC 8.7  4.0 - 10.5 K/uL   RBC 3.73 (*) 3.87 - 5.11 MIL/uL   Hemoglobin 11.8 (*) 12.0 - 15.0 g/dL   HCT 16.134.7 (*) 09.636.0 - 04.546.0 %   MCV 93.0  78.0 - 100.0 fL   MCH 31.6  26.0 - 34.0 pg   MCHC 34.0  30.0 - 36.0 g/dL   RDW 40.913.5  81.111.5 - 91.415.5 %   Platelets 200  150 - 400 K/uL  CBC     Status: Abnormal   Collection Time    11/10/13  6:01 AM      Result Value Range   WBC 9.3  4.0 - 10.5 K/uL   RBC 3.41 (*) 3.87 - 5.11 MIL/uL   Hemoglobin 10.9 (*) 12.0 - 15.0 g/dL   HCT 78.232.0 (*) 95.636.0 - 21.346.0 %   MCV 93.8  78.0 - 100.0 fL   MCH 32.0  26.0 - 34.0 pg   MCHC 34.1  30.0 - 36.0 g/dL   RDW 08.613.5  57.811.5 - 46.915.5 %   Platelets 185  150 - 400 K/uL  TYPE AND SCREEN     Status: None   Collection Time    11/10/13  6:01 AM      Result Value Range   ABO/RH(D) O POS     Antibody Screen NEG     Sample Expiration 11/13/2013      Assessment and Plan 2171w5d  Second trimester bleeding Pt stable with normal US She has some contractions.  Will try procardia

## 2013-11-11 NOTE — H&P (Signed)
Chief Complaint   Patient presents with   .  Vaginal Bleeding    HPI Pt presents to MAU with c/o "gush of blood" from her vagina just before admission.  Pt reports light bleeding with wiping only approx 2hrs prior to admission but then had "gush" when sitting on toliet after that and reports enough got onto underwear.  Denies cramping but c/o increased vaginal pressure and pain.  No hx of preterm labor.  Reports she did have intercourse this morning.  She denies any itching, burning or odor.  Reports active fetus.    OB History     Grav  Para  Term  Preterm  Abortions  TAB  SAB  Ect  Mult  Living     2  1  1              1           Past Medical History   Diagnosis  Date   .  Asthma     .  Right ureteral stone     .  Anxiety     .  Yeast infection         Past Surgical History   Procedure  Laterality  Date   .  Cystoscopy w/ retrogrades    06/03/2012       Procedure: CYSTOSCOPY WITH RETROGRADE PYELOGRAM;  Surgeon: Valetta Fuller, MD;  Location: [facility];  Service: Urology;  Laterality: Right;       Family History   Problem  Relation  Age of Onset   .  Anesthesia problems  Neg Hx     .  Von Willebrand disease  Sister     .  Hypertension  Maternal Grandmother     .  Cancer  Paternal Grandmother         unknown origin   .  Cancer  Paternal Grandfather         unknown origin   .  Hypertension  Mother     .  Cancer  Father         lung       History   Substance Use Topics   .  Smoking status:  Never Smoker    .  Smokeless tobacco:  Never Used   .  Alcohol Use:  No         Comment: occasional      Allergies:   Allergies   Allergen  Reactions   .  Vicodin [Hydrocodone-Acetaminophen]  Nausea And Vomiting       Prescriptions prior to admission   Medication  Sig  Dispense  Refill   .  Prenatal Vit-Fe Fumarate-FA (PRENATAL MULTIVITAMIN) TABS tablet  Take 1 tablet by mouth daily at 12 noon.         Marland Kitchen  albuterol (PROVENTIL HFA;VENTOLIN HFA) 108 (90 BASE)  MCG/ACT inhaler  Inhale 2 puffs into the lungs every 6 (six) hours as needed. For shortness of breath         .  budesonide-formoterol (SYMBICORT) 160-4.5 MCG/ACT inhaler  Inhale 2 puffs into the lungs as needed. SOB            Review of Systems  Constitutional: Negative.   HENT: Negative.   Respiratory: Negative.   Cardiovascular: Negative.   Gastrointestinal: Negative.   Genitourinary: Negative.   Musculoskeletal: Negative.   Skin: Negative.   Neurological: Negative.   Endo/Heme/Allergies: Negative.   Psychiatric/Behavioral: Negative.   Physical Exam  Blood pressure 115/58, pulse 93, temperature 98 F (36.7 C), resp. rate 20, height 5\' 2"  (1.575 m), weight 50.44 kg (111 lb 3.2 oz).   Physical Exam  Constitutional: She is oriented to person, place, and time. She appears well-developed and well-nourished.  HENT:   Head: Normocephalic and atraumatic.  Right Ear: External ear normal.  Left Ear: External ear normal.   Nose: Nose normal.  Eyes: Conjunctivae are normal. Pupils are equal, round, and reactive to light.  Neck: Normal range of motion. Neck supple.  Cardiovascular: Normal rate, regular rhythm and intact distal pulses.   Respiratory: Effort normal and breath sounds normal.  GI: Soft. Bowel sounds are normal.  Genitourinary: Vagina normal and uterus normal.  Ext gent WNL.  BUS neg.  Sm amt dk red blood and small clot noted in vagina.  Cx without lesions noted and visually closed. SVE:  Closed/Thick/Post/Firm/Vtx.   Musculoskeletal: Normal range of motion.  Neurological: She is alert and oriented to person, place, and time. She has normal reflexes.  Skin: Skin is warm and dry.  Psychiatric: She has a normal mood and affect. Her behavior is normal.  FHR baseline 145; Variability-moderate; Appropriate for gestational age with 10 x 10 accels present.  No decels noted. No UCs noted on toco.   Ultrasound with ? Subchorionic hemorrhage vs placental lake UDS -  Neg GC/Chl-pending    Assessment and Plan    IUP at 22w 6d Vaginal bleeding - ? etiology   Per consult with Dr. Stefano GaulStringer, will admit to Antepartum for observation [place] Routine Antepartum orders   Timonthy Hovater O. 11/09/2013; 2:34 PM

## 2013-11-11 NOTE — Progress Notes (Signed)
Discharged via wheelchair.

## 2013-11-11 NOTE — Discharge Summary (Addendum)
Physician Discharge Summary  Patient ID: Marie MarkBrandy D Dizon MRN: [id number] DOB/AGE: 1990-10-16 22 y.o.  Admit date: 11/08/2013 Discharge date: 11/11/2013  Admission Diagnoses: 2nd trimester bleeding  Discharge Diagnoses:  Active Problems:   Vaginal bleeding in pregnancy @nd  trimester bleeding of unclear etiology  Discharged Condition: good  Hospital Course: Pt admitted and observed.  MFM u/s within normal limits.  Pt denies any further bleeding since admission and denies any contractions, LOF and reports good FM.  Pt did not feel ctxs yesterday but was given procardia to stop ctxs and it helped.  Pt has only infrequent ctxs at time of discharge and has not required procardia for greater than 12hrs.  Hgb 11.8 to 10.9.  Pelvic rest recommended.  Consults: MFM  Significant Diagnostic Studies: ultrasound as above  Treatments: observation  Discharge Exam: Blood pressure 87/46, pulse 85, temperature 98.2 F (36.8 C), temperature source Oral, resp. rate 18, height 5\' 2"  (1.575 m), weight 50.349 kg (111 lb). General appearance: alert and no distress Resp: clear to auscultation bilaterally Cardio: regular rate and rhythm Extremities: Homans sign is negative, no sign of DVT Abdomen soft, NT  Disposition: 01-Home or Self Care     Medication List         albuterol 108 (90 BASE) MCG/ACT inhaler  Commonly known as:  PROVENTIL HFA;VENTOLIN HFA  Inhale 2 puffs into the lungs every 6 (six) hours as needed. For shortness of breath     budesonide-formoterol 160-4.5 MCG/ACT inhaler  Commonly known as:  SYMBICORT  Inhale 2 puffs into the lungs as needed. SOB     prenatal multivitamin Tabs tablet  Take 1 tablet by mouth daily at 12 noon.           Follow-up Information   Follow up with Purcell NailsOBERTS,Kennidee Heyne Y, MD In 1 week. (pt instructed to call office to schedule appt for f/u in about 1wk)    Specialty:  Obstetrics and Gynecology   Contact information:   [address] [place] [address] [phone]       Signed: Purcell NailsROBERTS,Montgomery Favor Y 11/11/2013, 11:59 AM

## 2013-11-11 NOTE — Discharge Instructions (Signed)
Pelvic Rest °Pelvic rest is sometimes recommended for women when:  °· The placenta is partially or completely covering the opening of the cervix (placenta previa). °· There is bleeding between the uterine wall and the amniotic sac in the first trimester (subchorionic hemorrhage). °· The cervix begins to open without labor starting (incompetent cervix, cervical insufficiency). °· The labor is too early (preterm labor). °HOME CARE INSTRUCTIONS °· Do not have sexual intercourse, stimulation, or an orgasm. °· Do not use tampons, douche, or put anything in the vagina. °· Do not lift anything over 10 pounds (4.5 kg). °· Avoid strenuous activity or straining your pelvic muscles. °SEEK MEDICAL CARE IF:  °· You have any vaginal bleeding during pregnancy. Treat this as a potential emergency. °· You have cramping pain felt low in the stomach (stronger than menstrual cramps). °· You notice vaginal discharge (watery, mucus, or bloody). °· You have a low, dull backache. °· There are regular contractions or uterine tightening. °SEEK IMMEDIATE MEDICAL CARE IF: °You have vaginal bleeding and have placenta previa.  °Document Released: 01/20/2011 Document Revised: 12/18/2011 Document Reviewed: 01/20/2011 °ExitCare® Patient Information ©2014 [employer]. ° °

## 2014-01-12 ENCOUNTER — Ambulatory Visit (INDEPENDENT_AMBULATORY_CARE_PROVIDER_SITE_OTHER): Payer: Medicaid Other | Admitting: General Surgery

## 2014-01-12 ENCOUNTER — Encounter (INDEPENDENT_AMBULATORY_CARE_PROVIDER_SITE_OTHER): Payer: Self-pay | Admitting: General Surgery

## 2014-01-12 VITALS — BP 100/60 | HR 82 | Temp 98.4°F | Resp 16 | Ht 62.0 in | Wt 116.2 lb

## 2014-01-12 DIAGNOSIS — R229 Localized swelling, mass and lump, unspecified: Secondary | ICD-10-CM

## 2014-01-12 DIAGNOSIS — R222 Localized swelling, mass and lump, trunk: Secondary | ICD-10-CM | POA: Insufficient documentation

## 2014-01-12 NOTE — Patient Instructions (Signed)
Will get mri of lumbar area

## 2014-01-12 NOTE — Progress Notes (Signed)
Patient ID: Marie Holt, female   DOB: May 26, 1991, 23 y.o.   MRN: [id number]  Chief Complaint  Patient presents with  . New Evaluation    Lipoma lumbar area    HPI Marie Holt is a 23 y.o. female.  We are asked to see the patient in consultation by Dr. Osborn Coho to evaluate her for a mass on her low back. The patient is a 23 year old female who is [redacted] weeks pregnant who has noticed a swollen area on her back over the last 4-5 months. Over the last month she feels as though it may have gotten a little bit larger. She denies any pain locally at the area. She denies any pain radiating down her legs. She has had no drainage from the area. She denies any fevers and chills.  HPI  Past Medical History  Diagnosis Date  . Asthma   . Right ureteral stone   . Anxiety   . Yeast infection     Past Surgical History  Procedure Laterality Date  . Cystoscopy w/ retrogrades  06/03/2012    Procedure: CYSTOSCOPY WITH RETROGRADE PYELOGRAM;  Surgeon: Valetta Fuller, MD;  Location: [facility];  Service: Urology;  Laterality: Right;    Family History  Problem Relation Age of Onset  . Anesthesia problems Neg Hx   . Von Willebrand disease Sister   . Hypertension Maternal Grandmother   . Cancer Paternal Grandmother     unknown origin  . Cancer Paternal Grandfather     unknown origin  . Hypertension Mother   . Cancer Father     lung  . Cancer Paternal Aunt     breast    Social History History  Substance Use Topics  . Smoking status: Never Smoker   . Smokeless tobacco: Never Used  . Alcohol Use: No     Comment: occasional    Allergies  Allergen Reactions  . Vicodin [Hydrocodone-Acetaminophen] Nausea And Vomiting    Current Outpatient Prescriptions  Medication Sig Dispense Refill  . Prenatal Vit-Fe Fumarate-FA (PRENATAL MULTIVITAMIN) TABS tablet Take 1 tablet by mouth daily at 12 noon.       No current facility-administered medications for this visit.    Review  of Systems Review of Systems  Constitutional: Negative.   HENT: Negative.   Eyes: Negative.   Respiratory: Negative.   Cardiovascular: Negative.   Gastrointestinal: Negative.   Endocrine: Negative.   Genitourinary: Negative.   Musculoskeletal: Negative.   Skin: Negative.   Allergic/Immunologic: Negative.   Neurological: Negative.   Hematological: Negative.   Psychiatric/Behavioral: Negative.     Blood pressure 100/60, pulse 82, temperature 98.4 F (36.9 C), temperature source Temporal, resp. rate 16, height 5\' 2"  (1.575 m), weight 116 lb 3.2 oz (52.708 kg).  Physical Exam Physical Exam  Constitutional: She is oriented to person, place, and time. She appears well-developed and well-nourished.  HENT:  Head: Normocephalic and atraumatic.  Eyes: Conjunctivae and EOM are normal. Pupils are equal, round, and reactive to light.  Neck: Normal range of motion. Neck supple.  Cardiovascular: Normal rate, regular rhythm and normal heart sounds.   Pulmonary/Chest: Effort normal and breath sounds normal.  Abdominal: Soft. Bowel sounds are normal.  Her abdominal exam is consistent with being [redacted] weeks pregnant  Musculoskeletal: Normal range of motion.  There is some generalized affirm fullness in the lumbar area near her spinal cord. This does not have a typical fatty feel like a lipoma would.  Lymphadenopathy:  She has no cervical adenopathy.  Neurological: She is alert and oriented to person, place, and time.  Skin: Skin is warm and dry.  Psychiatric: She has a normal mood and affect. Her behavior is normal.    Data Reviewed As above  Assessment    The patient is [redacted] weeks pregnant with a swelling or mass type of finding near her lumbar spine. It is difficult to tell whether this is truly a mass or could possibly be a change in her angle of her spine and pelvis secondary to the pregnancy     Plan    In order to properly evaluate this I think it would need to be imaged. In order  to safely do this given her pregnancy I think he we would need to do this with an MRI of her lumbar spine area. We will order this study and call her with the results. We will then proceed accordingly once her pregnancy has delivered        TOTH III,PAUL S 01/12/2014, 3:36 PM

## 2014-01-18 ENCOUNTER — Inpatient Hospital Stay: Admission: RE | Admit: 2014-01-18 | Payer: Medicaid Other | Source: Ambulatory Visit

## 2014-01-19 ENCOUNTER — Ambulatory Visit: Payer: Medicaid Other

## 2014-01-25 ENCOUNTER — Ambulatory Visit
Admission: RE | Admit: 2014-01-25 | Discharge: 2014-01-25 | Disposition: A | Discharge status: Home / Self Care | Payer: Medicaid Other | Source: Ambulatory Visit | Attending: General Surgery | Admitting: General Surgery

## 2014-01-25 DIAGNOSIS — R222 Localized swelling, mass and lump, trunk: Secondary | ICD-10-CM

## 2014-01-26 ENCOUNTER — Telehealth (INDEPENDENT_AMBULATORY_CARE_PROVIDER_SITE_OTHER): Payer: Self-pay

## 2014-01-26 NOTE — Telephone Encounter (Signed)
LM> MRI did not show mass. Dr Carolynne Edouardoth believes this area will resolve once she delivers. No surgery needed at this time.

## 2014-01-26 NOTE — Telephone Encounter (Signed)
Message copied by Brennan BaileyBROOKS, Mckensey Berghuis on Mon Jan 26, 2014  2:03 PM ------      Message from: Caleen EssexTH, PAUL S III      Created: Mon Jan 26, 2014  1:07 PM       No mass by MRI ------

## 2014-02-06 NOTE — Telephone Encounter (Signed)
Pt returned call and advised of attached msg from Michelle/Dr Carolynne Edouardoth. Pt states she understands.

## 2014-02-22 LAB — OB RESULTS CONSOLE GBS: STREP GROUP B AG: NEGATIVE

## 2014-03-13 ENCOUNTER — Inpatient Hospital Stay (HOSPITAL_COMMUNITY)
Admission: AD | Admit: 2014-03-13 | Discharge: 2014-03-16 | DRG: 775 | Disposition: A | Discharge status: Home / Self Care | Payer: Medicaid Other | Source: Ambulatory Visit | Attending: Obstetrics and Gynecology | Admitting: Obstetrics and Gynecology

## 2014-03-13 ENCOUNTER — Encounter (HOSPITAL_COMMUNITY): Payer: Self-pay | Admitting: Obstetrics

## 2014-03-13 DIAGNOSIS — O9903 Anemia complicating the puerperium: Secondary | ICD-10-CM | POA: Diagnosis present

## 2014-03-13 DIAGNOSIS — N289 Disorder of kidney and ureter, unspecified: Secondary | ICD-10-CM | POA: Diagnosis present

## 2014-03-13 DIAGNOSIS — O3460 Maternal care for abnormality of vagina, unspecified trimester: Principal | ICD-10-CM | POA: Diagnosis present

## 2014-03-13 DIAGNOSIS — O26839 Pregnancy related renal disease, unspecified trimester: Secondary | ICD-10-CM | POA: Diagnosis present

## 2014-03-13 DIAGNOSIS — IMO0001 Reserved for inherently not codable concepts without codable children: Secondary | ICD-10-CM

## 2014-03-13 DIAGNOSIS — Z87442 Personal history of urinary calculi: Secondary | ICD-10-CM

## 2014-03-13 DIAGNOSIS — D649 Anemia, unspecified: Secondary | ICD-10-CM | POA: Diagnosis present

## 2014-03-13 HISTORY — DX: Reserved for concepts with insufficient information to code with codable children: IMO0002

## 2014-03-13 MED ORDER — LACTATED RINGERS IV SOLN
INTRAVENOUS | Status: DC
  Administered 2014-03-14: 03:00:00 via INTRAVENOUS

## 2014-03-13 MED ORDER — DIPHENHYDRAMINE HCL 50 MG/ML IJ SOLN: 12.5000 mg | INTRAMUSCULAR | Status: DC | PRN

## 2014-03-13 MED ORDER — PHENYLEPHRINE 40 MCG/ML (10ML) SYRINGE FOR IV PUSH (FOR BLOOD PRESSURE SUPPORT)
80.0000 ug | PREFILLED_SYRINGE | INTRAVENOUS | Status: DC | PRN
  Filled 2014-03-13: qty 2

## 2014-03-13 MED ORDER — FLEET ENEMA 7-19 GM/118ML RE ENEM: 1.0000 | ENEMA | Freq: Every day | RECTAL | Status: DC | PRN

## 2014-03-13 MED ORDER — EPHEDRINE 5 MG/ML INJ
10.0000 mg | INTRAVENOUS | Status: DC | PRN
  Filled 2014-03-13: qty 2

## 2014-03-13 MED ORDER — EPHEDRINE 5 MG/ML INJ
10.0000 mg | INTRAVENOUS | Status: DC | PRN
  Filled 2014-03-13: qty 2
  Filled 2014-03-13: qty 4

## 2014-03-13 MED ORDER — ACETAMINOPHEN 325 MG PO TABS: 650.0000 mg | ORAL_TABLET | ORAL | Status: DC | PRN

## 2014-03-13 MED ORDER — FENTANYL 2.5 MCG/ML BUPIVACAINE 1/10 % EPIDURAL INFUSION (WH - ANES)
12.0000 mL/h | INTRAMUSCULAR | Status: DC | PRN
  Filled 2014-03-13: qty 125

## 2014-03-13 MED ORDER — PHENYLEPHRINE 40 MCG/ML (10ML) SYRINGE FOR IV PUSH (FOR BLOOD PRESSURE SUPPORT)
80.0000 ug | PREFILLED_SYRINGE | INTRAVENOUS | Status: DC | PRN
  Filled 2014-03-13: qty 2
  Filled 2014-03-13: qty 10

## 2014-03-13 MED ORDER — LIDOCAINE HCL (PF) 1 % IJ SOLN
30.0000 mL | INTRAMUSCULAR | Status: AC | PRN
  Administered 2014-03-14: 30 mL via SUBCUTANEOUS
  Filled 2014-03-13: qty 30

## 2014-03-13 MED ORDER — OXYTOCIN BOLUS FROM INFUSION
500.0000 mL | INTRAVENOUS | Status: DC
Start: 2014-03-14 — End: 2014-03-14
  Administered 2014-03-14: 500 mL via INTRAVENOUS

## 2014-03-13 MED ORDER — CITRIC ACID-SODIUM CITRATE 334-500 MG/5ML PO SOLN: 30.0000 mL | ORAL | Status: DC | PRN

## 2014-03-13 MED ORDER — LACTATED RINGERS IV SOLN: 500.0000 mL | INTRAVENOUS | Status: DC | PRN

## 2014-03-13 MED ORDER — LACTATED RINGERS IV SOLN
500.0000 mL | Freq: Once | INTRAVENOUS | Status: AC
  Administered 2014-03-14: 500 mL via INTRAVENOUS

## 2014-03-13 MED ORDER — IBUPROFEN 600 MG PO TABS
600.0000 mg | ORAL_TABLET | Freq: Four times a day (QID) | ORAL | Status: DC | PRN
Start: 2014-03-13 — End: 2014-03-14
  Administered 2014-03-14: 600 mg via ORAL
  Filled 2014-03-13: qty 1

## 2014-03-13 MED ORDER — OXYTOCIN 40 UNITS IN LACTATED RINGERS INFUSION - SIMPLE MED
62.5000 mL/h | INTRAVENOUS | Status: DC
  Filled 2014-03-13: qty 1000

## 2014-03-13 MED ORDER — ONDANSETRON HCL 4 MG/2ML IJ SOLN: 4.0000 mg | Freq: Four times a day (QID) | INTRAMUSCULAR | Status: DC | PRN

## 2014-03-13 NOTE — MAU Note (Signed)
Contractions for couple hours. No bleeding or leaking. 3.5cm  Last exam

## 2014-03-14 ENCOUNTER — Inpatient Hospital Stay (HOSPITAL_COMMUNITY): Payer: Medicaid Other | Admitting: Anesthesiology

## 2014-03-14 ENCOUNTER — Encounter (HOSPITAL_COMMUNITY): Payer: Medicaid Other | Admitting: Anesthesiology

## 2014-03-14 ENCOUNTER — Encounter (HOSPITAL_COMMUNITY): Payer: Self-pay | Admitting: Obstetrics

## 2014-03-14 LAB — CBC
HCT: 36.6 % (ref 36.0–46.0)
Hemoglobin: 12.2 g/dL (ref 12.0–15.0)
MCH: 31 pg (ref 26.0–34.0)
MCHC: 33.3 g/dL (ref 30.0–36.0)
MCV: 93.1 fL (ref 78.0–100.0)
PLATELETS: 192 10*3/uL (ref 150–400)
RBC: 3.93 MIL/uL (ref 3.87–5.11)
RDW: 12.6 % (ref 11.5–15.5)
WBC: 11.7 10*3/uL — AB (ref 4.0–10.5)

## 2014-03-14 LAB — RPR

## 2014-03-14 MED ORDER — LIDOCAINE HCL (PF) 1 % IJ SOLN
INTRAMUSCULAR | Status: DC | PRN
  Administered 2014-03-14 (×2): 4 mL

## 2014-03-14 MED ORDER — BENZOCAINE-MENTHOL 20-0.5 % EX AERO
1.0000 "application " | INHALATION_SPRAY | CUTANEOUS | Status: DC | PRN
  Administered 2014-03-14: 1 via TOPICAL
  Filled 2014-03-14: qty 56

## 2014-03-14 MED ORDER — SENNOSIDES-DOCUSATE SODIUM 8.6-50 MG PO TABS
2.0000 | ORAL_TABLET | ORAL | Status: DC
  Administered 2014-03-15 (×2): 2 via ORAL
  Filled 2014-03-14 (×2): qty 2

## 2014-03-14 MED ORDER — TETANUS-DIPHTH-ACELL PERTUSSIS 5-2.5-18.5 LF-MCG/0.5 IM SUSP: 0.5000 mL | Freq: Once | INTRAMUSCULAR | Status: DC

## 2014-03-14 MED ORDER — OXYCODONE-ACETAMINOPHEN 5-325 MG PO TABS
1.0000 | ORAL_TABLET | ORAL | Status: DC | PRN
Start: 2014-03-14 — End: 2014-03-16
  Administered 2014-03-14 – 2014-03-16 (×5): 1 via ORAL
  Filled 2014-03-14 (×6): qty 1

## 2014-03-14 MED ORDER — IBUPROFEN 600 MG PO TABS
600.0000 mg | ORAL_TABLET | Freq: Four times a day (QID) | ORAL | Status: DC
  Administered 2014-03-14 – 2014-03-16 (×8): 600 mg via ORAL
  Filled 2014-03-14 (×8): qty 1

## 2014-03-14 MED ORDER — DIBUCAINE 1 % RE OINT: 1.0000 "application " | TOPICAL_OINTMENT | RECTAL | Status: DC | PRN

## 2014-03-14 MED ORDER — PRENATAL MULTIVITAMIN CH
1.0000 | ORAL_TABLET | Freq: Every day | ORAL | Status: DC
  Administered 2014-03-14 – 2014-03-15 (×2): 1 via ORAL
  Filled 2014-03-14 (×2): qty 1

## 2014-03-14 MED ORDER — DIPHENHYDRAMINE HCL 25 MG PO CAPS: 25.0000 mg | ORAL_CAPSULE | Freq: Four times a day (QID) | ORAL | Status: DC | PRN

## 2014-03-14 MED ORDER — ONDANSETRON HCL 4 MG PO TABS
4.0000 mg | ORAL_TABLET | ORAL | Status: DC | PRN
  Administered 2014-03-15: 4 mg via ORAL
  Filled 2014-03-14: qty 1

## 2014-03-14 MED ORDER — ZOLPIDEM TARTRATE 5 MG PO TABS: 5.0000 mg | ORAL_TABLET | Freq: Every evening | ORAL | Status: DC | PRN

## 2014-03-14 MED ORDER — FENTANYL 2.5 MCG/ML BUPIVACAINE 1/10 % EPIDURAL INFUSION (WH - ANES)
INTRAMUSCULAR | Status: DC | PRN
  Administered 2014-03-14: 12 mL/h via EPIDURAL

## 2014-03-14 MED ORDER — BUPIVACAINE HCL (PF) 0.75 % IJ SOLN
12.0000 mL/h | INTRAMUSCULAR | Status: DC | PRN
  Filled 2014-03-14 (×2): qty 17

## 2014-03-14 MED ORDER — ONDANSETRON HCL 4 MG/2ML IJ SOLN: 4.0000 mg | INTRAMUSCULAR | Status: DC | PRN

## 2014-03-14 MED ORDER — SIMETHICONE 80 MG PO CHEW: 80.0000 mg | CHEWABLE_TABLET | ORAL | Status: DC | PRN

## 2014-03-14 MED ORDER — LANOLIN HYDROUS EX OINT: TOPICAL_OINTMENT | CUTANEOUS | Status: DC | PRN

## 2014-03-14 MED ORDER — WITCH HAZEL-GLYCERIN EX PADS: 1.0000 "application " | MEDICATED_PAD | CUTANEOUS | Status: DC | PRN

## 2014-03-14 NOTE — Anesthesia Postprocedure Evaluation (Signed)
Anesthesia Post Note  Patient: Marie Holt  Procedure(s) Performed: * No procedures listed *  Anesthesia type: Epidural  Patient location: Mother/Baby  Post pain: Pain level controlled  Post assessment: Post-op Vital signs reviewed  Last Vitals:  Filed Vitals:   03/14/14 0640  BP: 118/77  Pulse: 74  Temp: 36.9 C  Resp: 18    Post vital signs: Reviewed  Level of consciousness:alert  Complications: No apparent anesthesia complications

## 2014-03-14 NOTE — H&P (Signed)
Marie Holt is a 23 y.o. female presenting for labor, denies VB or LOF, +FM.   Pt began [facility] at 17wks Anatomy US at 20wks WNL, except limited spinal views F/u US at 23wks WNL 1hr glucola normal at 28wks Seen by General surgery at 30wks for lumbar mass, MRI was normal  GBS neg   Maternal Medical History:  Reason for admission: Contractions.   Contractions: Onset was 3-5 hours ago.   Frequency: regular.   Perceived severity is moderate.    Fetal activity: Perceived fetal activity is normal.   Last perceived fetal movement was within the past hour.    Prenatal complications: no prenatal complications Prenatal Complications - Diabetes: none.    OB History   Grav Para Term Preterm Abortions TAB SAB Ect Mult Living   2 2 2       2      Past Medical History  Diagnosis Date  . Asthma   . Right ureteral stone   . Anxiety   . Yeast infection   . Intervertebral disc protrusion     small central disc protrusion at L4-L5   Past Surgical History  Procedure Laterality Date  . Cystoscopy w/ retrogrades  06/03/2012    Procedure: CYSTOSCOPY WITH RETROGRADE PYELOGRAM;  Surgeon: Valetta Fuller, MD;  Location: [facility];  Service: Urology;  Laterality: Right;   Family History: family history includes Cancer in her father, paternal aunt, paternal grandfather, and paternal grandmother; Hypertension in her maternal grandmother and mother; Von Willebrand disease in her sister. There is no history of Anesthesia problems. Social History:  reports that she has never smoked. She has never used smokeless tobacco. She reports that she does not drink alcohol or use illicit drugs.   Prenatal Transfer Tool  Maternal Diabetes: No Genetic Screening: Declined Maternal Ultrasounds/Referrals: Normal Fetal Ultrasounds or other Referrals:  None Maternal Substance Abuse:  No Significant Maternal Medications:  None Significant Maternal Lab Results:  Lab values include: Group B Strep  negative Other Comments:  None  Review of Systems  All other systems reviewed and are negative.   Dilation: 10 Effacement (%): 100 Station: +2;+3 Exam by:: H stone rnc Blood pressure 118/77, pulse 74, temperature 98.4 F (36.9 C), temperature source Axillary, resp. rate 18, height 5\' 2"  (1.575 m), weight 120 lb (54.432 kg), SpO2 100.00%, unknown if currently breastfeeding. Maternal Exam:  Uterine Assessment: Contraction strength is moderate.  Contraction frequency is regular.   Abdomen: Patient reports no abdominal tenderness. Fetal presentation: vertex  Introitus: Normal vulva. Normal vagina.  Pelvis: adequate for delivery.   Cervix: Cervix evaluated by digital exam.     Physical Exam  Nursing note and vitals reviewed. Constitutional: She is oriented to person, place, and time. She appears well-developed and well-nourished.  HENT:  Head: Normocephalic.  Eyes: Pupils are equal, round, and reactive to light.  Neck: Normal range of motion.  Cardiovascular: Normal rate, regular rhythm and normal heart sounds.   Respiratory: Effort normal and breath sounds normal.  GI: Soft. Bowel sounds are normal.  Genitourinary: Vagina normal.  Musculoskeletal: Normal range of motion.  Neurological: She is alert and oriented to person, place, and time. She has normal reflexes.  Skin: Skin is warm and dry.  Psychiatric: She has a normal mood and affect. Her behavior is normal.    Prenatal labs: ABO, Rh: --/--/O POS (02/02 0601) Antibody: NEG (02/02 0601) Rubella: Immune (12/19 0000) RPR: NON REAC (06/05 2345)  HBsAg: Negative (12/19 0000)  HIV:  Non-reactive (01/08 0000)  GBS: Negative (05/17 0000)   Assessment/Plan: IUP at 40wks Active labor GBS neg FHR cat 1  Admit to b.s. Per c/w Dr Su Hiltoberts Routine L&D orders    Malissa HippoShelley M Daryll Spisak 03/14/2014, 7:33 AM

## 2014-03-14 NOTE — Anesthesia Procedure Notes (Signed)
Epidural Patient location during procedure: OB Start time: 03/14/2014 12:34 AM End time: 03/14/2014 12:44 AM  Staffing Anesthesiologist: Lewie Loron R Performed by: anesthesiologist   Preanesthetic Checklist Completed: patient identified, pre-op evaluation, timeout performed, IV checked, risks and benefits discussed and monitors and equipment checked  Epidural Patient position: sitting Prep: site prepped and draped and DuraPrep Patient monitoring: heart rate Approach: midline Location: L2-L3 Injection technique: LOR air and LOR saline  Needle:  Needle type: Tuohy  Needle gauge: 17 G Needle length: 9 cm Needle insertion depth: 4 cm Catheter type: closed end flexible Catheter size: 19 Gauge Catheter at skin depth: 10 cm Test dose: negative  Assessment Sensory level: T8 Events: blood not aspirated, injection not painful, no injection resistance, negative IV test and no paresthesia  Additional Notes Reason for block:procedure for pain

## 2014-03-14 NOTE — Anesthesia Preprocedure Evaluation (Signed)
Anesthesia Evaluation  Patient identified by MRN, date of birth, ID band Patient awake    Reviewed: Allergy & Precautions, H&P , NPO status , Patient's Chart, lab work & pertinent test results, reviewed documented beta blocker date and time   Airway Mallampati: II TM Distance: >3 FB Neck ROM: full    Dental no notable dental hx. (+) Teeth Intact, Dental Advidsory Given   Pulmonary asthma ,  breath sounds clear to auscultation  Pulmonary exam normal       Cardiovascular Exercise Tolerance: Good negative cardio ROS  Rhythm:regular Rate:Normal     Neuro/Psych PSYCHIATRIC DISORDERS Anxiety negative neurological ROS     GI/Hepatic negative GI ROS, Neg liver ROS,   Endo/Other  negative endocrine ROS  Renal/GU Renal disease     Musculoskeletal   Abdominal   Peds  Hematology negative hematology ROS (+)   Anesthesia Other Findings   Reproductive/Obstetrics (+) Pregnancy                           Anesthesia Physical  Anesthesia Plan  ASA: II  Anesthesia Plan: Epidural   Post-op Pain Management:    Induction:   Airway Management Planned:   Additional Equipment:   Intra-op Plan:   Post-operative Plan:   Informed Consent: I have reviewed the patients History and Physical, chart, labs and discussed the procedure including the risks, benefits and alternatives for the proposed anesthesia with the patient or authorized representative who has indicated his/her understanding and acceptance.     Plan Discussed with:   Anesthesia Plan Comments:         Anesthesia Quick Evaluation

## 2014-03-14 NOTE — Lactation Note (Signed)
This note was copied from the chart of Marie Lashayna Carlyle. Lactation Consultation Note  Patient Name: Marie Holt [id number] Date: 03/14/2014 Reason for consult: Initial assessment of this second-time mother and her newborn, 18 hours postpartum. Mom breastfed for one month with her first baby, now almost 23 yo.  Mom had to stop due to medications and treatment for kidney stones. Mom states she is feeling strong uterine contractions with nursing and LC explained reason and that this will only last about a week. LC encouraged frequent STS and cue feedings ad lib but discussed minimum recommendation for breastfed babies to nurse at least 8 times per 24 hours on at least one breast.  RN, Corrie Dandy has shown mom hand expression and mom states that she has seen some colostrum expressed.  Mom encouraged to feed baby 8-12 times/24 hours and with feeding cues. LC encouraged review of Baby and Me pp 9, 14 and 20-25 for STS and BF information. LC provided [employer] brochure and reviewed [facility] services and list of community and web site resources.     Maternal Data Formula Feeding for Exclusion: No Infant to breast within first hour of birth: Yes (initial LATCH score=5 per RN (mom and baby needed assistance)) Has patient been taught Hand Expression?: Yes (per Claudina Lick, RN at 1615 feeding) Does the patient have breastfeeding experience prior to this delivery?: Yes  Feeding Feeding Type: Breast Fed Length of feed: 25 min  LATCH Score/Interventions           most recent LATCH score=8 per RN assessment           Lactation Tools Discussed/Used   STS, cue feedings, hand expression  Consult Status Consult Status: Follow-up Date: 03/15/14 Follow-up type: In-patient    Zara Chess 03/14/2014, 10:37 PM

## 2014-03-15 LAB — CBC
HEMATOCRIT: 31.7 % — AB (ref 36.0–46.0)
HEMOGLOBIN: 10.2 g/dL — AB (ref 12.0–15.0)
MCH: 30.2 pg (ref 26.0–34.0)
MCHC: 32.2 g/dL (ref 30.0–36.0)
MCV: 93.8 fL (ref 78.0–100.0)
Platelets: 167 10*3/uL (ref 150–400)
RBC: 3.38 MIL/uL — ABNORMAL LOW (ref 3.87–5.11)
RDW: 12.7 % (ref 11.5–15.5)
WBC: 8.6 10*3/uL (ref 4.0–10.5)

## 2014-03-15 NOTE — Progress Notes (Signed)
Marie Holt  Post Partum Day 1: S/P SVD with no lac  Subjective: Patient up ad lib, denies syncope or dizziness.  Voiding without difficulty, pain well manage. Feeding:  Breastfeeding Contraceptive plan:   Undecided at this time  Objective: Blood pressure 105/72, pulse 64, temperature 97.9 F (36.6 C), temperature source Oral, resp. rate 16, height 5\' 2"  (1.575 m), weight 120 lb (54.432 kg), SpO2 100.00%, unknown if currently breastfeeding.  Physical Exam:  General: alert, cooperative and no distress Lochia: appropriate Uterine Fundus: firm Incision: healing well DVT Evaluation: No evidence of DVT seen on physical exam. Negative Homan's sign.   Recent Labs  03/13/14 2345 03/15/14 0638  HGB 12.2 10.2*  HCT 36.6 31.7*    A/P S/P Vaginal delivery day 1 Asymptomatic Anemia  Continue current care Plan for discharge tomorrow     Haroldine Laws, CNM 03/15/2014, 11:40 AM

## 2014-03-15 NOTE — Progress Notes (Signed)
Clinical Social Work Department PSYCHOSOCIAL ASSESSMENT - MATERNAL/CHILD 03/15/2014  Patient:  Marie Holt, Marie Holt  Account Number:  [id number]  Admit Date:  03/13/2014  Marjo Bicker Name:   [facility]    Clinical Social Worker:  Arlington Sigmund, LCSW   Date/Time:  03/15/2014 11:45 AM  Date Referred:  03/15/2014   Referral source  Central Nursery     Referred reason  Depression/Anxiety   Other referral source:    I:  FAMILY / HOME ENVIRONMENT Child's legal guardian:  PARENT  Guardian - Name Guardian - Age Guardian - Address  ANDRAEA, LEGGIO D [address]  [place], [place] [address]  Maynor, Madelin Rear 22 same as above   Other household support members/support persons Other support:    II  PSYCHOSOCIAL DATA Information Source:    Event organiser Employment:   Surveyor, quantity resources:  [employer] If [employer]:   Building services engineer / Grade:   Maternity Care Coordinator / Child Services Coordination / Early Interventions:  Cultural issues impacting care:    III  STRENGTHS Strengths  Supportive family/friends  Home prepared for Child (including basic supplies)  Adequate Resources   Strength comment:    IV  RISK FACTORS AND CURRENT PROBLEMS Current Problem:       V  SOCIAL WORK ASSESSMENT Acknowledged order for Social Work consult to assess mother's history of anxiety.  Parents are not married but cohabitate.  They have one other dependent age 26 and are residing with paternal grandmother.  FOB is employed. Mother acknowledged hx of anxiety, but states that she has been able to cope with family support.   She denies current symptoms of depression or anxiety.  Discussed signs and symptoms of PP Depression.  Provided her with information and resources if needed.   She admits to occasional use of marijuana prior to finding out about the pregnancy.   No acute social concerns noted at this time.  Mother informed of social work Surveyor, mining.      VI SOCIAL  WORK PLAN Social Work Plan  No Further Intervention Required / No Barriers to Discharge

## 2014-03-16 MED ORDER — IBUPROFEN 600 MG PO TABS: 600.0000 mg | ORAL_TABLET | Freq: Four times a day (QID) | ORAL | Status: DC | PRN

## 2014-03-16 NOTE — Discharge Instructions (Signed)
Anemia, Nonspecific Anemia is a condition in which the concentration of red blood cells or hemoglobin in the blood is below normal. Hemoglobin is a substance in red blood cells that carries oxygen to the tissues of the body. Anemia results in not enough oxygen reaching these tissues.  CAUSES  Common causes of anemia include:   Excessive bleeding. Bleeding may be internal or external. This includes excessive bleeding from periods (in women) or from the intestine.   Poor nutrition.   Chronic kidney, thyroid, and liver disease.  Bone marrow disorders that decrease red blood cell production.  Cancer and treatments for cancer.  HIV, AIDS, and their treatments.  Spleen problems that increase red blood cell destruction.  Blood disorders.  Excess destruction of red blood cells due to infection, medicines, and autoimmune disorders. SIGNS AND SYMPTOMS   Minor weakness.   Dizziness.   Headache.  Palpitations.   Shortness of breath, especially with exercise.   Paleness.  Cold sensitivity.  Indigestion.  Nausea.  Difficulty sleeping.  Difficulty concentrating. Symptoms may occur suddenly or they may develop slowly.  DIAGNOSIS  Additional blood tests are often needed. These help your health care provider determine the best treatment. Your health care provider will check your stool for blood and look for other causes of blood loss.  TREATMENT  Treatment varies depending on the cause of the anemia. Treatment can include:   Supplements of iron, vitamin B12, or folic acid.   Hormone medicines.   A blood transfusion. This may be needed if blood loss is severe.   Hospitalization. This may be needed if there is significant continual blood loss.   Dietary changes.  Spleen removal. HOME CARE INSTRUCTIONS Keep all follow-up appointments. It often takes many weeks to correct anemia, and having your health care provider check on your condition and your response to  treatment is very important. SEEK IMMEDIATE MEDICAL CARE IF:   You develop extreme weakness, shortness of breath, or chest pain.   You become dizzy or have trouble concentrating.  You develop heavy vaginal bleeding.   You develop a rash.   You have bloody or black, tarry stools.   You faint.   You vomit up blood.   You vomit repeatedly.   You have abdominal pain.  You have a fever or persistent symptoms for more than 2 3 days.   You have a fever and your symptoms suddenly get worse.   You are dehydrated.  MAKE SURE YOU:  Understand these instructions.  Will watch your condition.  Will get help right away if you are not doing well or get worse. Document Released: 11/02/2004 Document Revised: 05/28/2013 Document Reviewed: 03/21/2013 [facility] Patient Information 2014 [place], [place]. Contraception Choices Contraception (birth control) is the use of any methods or devices to prevent pregnancy. Below are some methods to help avoid pregnancy. HORMONAL METHODS   Contraceptive implant This is a thin, plastic tube containing progesterone hormone. It does not contain estrogen hormone. Your health care provider inserts the tube in the inner part of the upper arm. The tube can remain in place for up to 3 years. After 3 years, the implant must be removed. The implant prevents the ovaries from releasing an egg (ovulation), thickens the cervical mucus to prevent sperm from entering the uterus, and thins the lining of the inside of the uterus.  Progesterone-only injections These injections are given every 3 months by your health care provider to prevent pregnancy. This synthetic progesterone hormone stops the ovaries from releasing  eggs. It also thickens cervical mucus and changes the uterine lining. This makes it harder for sperm to survive in the uterus.  Birth control pills These pills contain estrogen and progesterone hormone. They work by preventing the ovaries from  releasing eggs (ovulation). They also cause the cervical mucus to thicken, preventing the sperm from entering the uterus. Birth control pills are prescribed by a health care provider.Birth control pills can also be used to treat heavy periods.  Minipill This type of birth control pill contains only the progesterone hormone. They are taken every day of each month and must be prescribed by your health care provider.  Birth control patch The patch contains hormones similar to those in birth control pills. It must be changed once a week and is prescribed by a health care provider.  Vaginal ring The ring contains hormones similar to those in birth control pills. It is left in the vagina for 3 weeks, removed for 1 week, and then a new one is put back in place. The patient must be comfortable inserting and removing the ring from the vagina.A health care provider's prescription is necessary.  Emergency contraception Emergency contraceptives prevent pregnancy after unprotected sexual intercourse. This pill can be taken right after sex or up to 5 days after unprotected sex. It is most effective the sooner you take the pills after having sexual intercourse. Most emergency contraceptive pills are available without a prescription. Check with your pharmacist. Do not use emergency contraception as your only form of birth control. BARRIER METHODS   Female condom This is a thin sheath (latex or rubber) that is worn over the penis during sexual intercourse. It can be used with spermicide to increase effectiveness.  Female condom. This is a soft, loose-fitting sheath that is put into the vagina before sexual intercourse.  Diaphragm This is a soft, latex, dome-shaped barrier that must be fitted by a health care provider. It is inserted into the vagina, along with a spermicidal jelly. It is inserted before intercourse. The diaphragm should be left in the vagina for 6 to 8 hours after intercourse.  Cervical cap This is a  round, soft, latex or plastic cup that fits over the cervix and must be fitted by a health care provider. The cap can be left in place for up to 48 hours after intercourse.  Sponge This is a soft, circular piece of polyurethane foam. The sponge has spermicide in it. It is inserted into the vagina after wetting it and before sexual intercourse.  Spermicides These are chemicals that kill or block sperm from entering the cervix and uterus. They come in the form of creams, jellies, suppositories, foam, or tablets. They do not require a prescription. They are inserted into the vagina with an applicator before having sexual intercourse. The process must be repeated every time you have sexual intercourse. INTRAUTERINE CONTRACEPTION  Intrauterine device (IUD) This is a T-shaped device that is put in a woman's uterus during a menstrual period to prevent pregnancy. There are 2 types:  Copper IUD This type of IUD is wrapped in copper wire and is placed inside the uterus. Copper makes the uterus and fallopian tubes produce a fluid that kills sperm. It can stay in place for 10 years.  Hormone IUD This type of IUD contains the hormone progestin (synthetic progesterone). The hormone thickens the cervical mucus and prevents sperm from entering the uterus, and it also thins the uterine lining to prevent implantation of a fertilized egg. The hormone can  weaken or kill the sperm that get into the uterus. It can stay in place for 3 5 years, depending on which type of IUD is used. PERMANENT METHODS OF CONTRACEPTION  Female tubal ligation This is when the woman's fallopian tubes are surgically sealed, tied, or blocked to prevent the egg from traveling to the uterus.  Hysteroscopic sterilization This involves placing a small coil or insert into each fallopian tube. Your doctor uses a technique called hysteroscopy to do the procedure. The device causes scar tissue to form. This results in permanent blockage of the fallopian  tubes, so the sperm cannot fertilize the egg. It takes about 3 months after the procedure for the tubes to become blocked. You must use another form of birth control for these 3 months.  Female sterilization This is when the female has the tubes that carry sperm tied off (vasectomy).This blocks sperm from entering the vagina during sexual intercourse. After the procedure, the man can still ejaculate fluid (semen). NATURAL PLANNING METHODS  Natural family planning This is not having sexual intercourse or using a barrier method (condom, diaphragm, cervical cap) on days the woman could become pregnant.  Calendar method This is keeping track of the length of each menstrual cycle and identifying when you are fertile.  Ovulation method This is avoiding sexual intercourse during ovulation.  Symptothermal method This is avoiding sexual intercourse during ovulation, using a thermometer and ovulation symptoms.  Post ovulation method This is timing sexual intercourse after you have ovulated. Regardless of which type or method of contraception you choose, it is important that you use condoms to protect against the transmission of sexually transmitted infections (STIs). Talk with your health care provider about which form of contraception is most appropriate for you. Document Released: 09/25/2005 Document Revised: 05/28/2013 Document Reviewed: 03/20/2013 [facility] Patient Information 2014 [place], [place]. Postpartum Depression and Baby Blues The postpartum period begins right after the birth of a baby. During this time, there is often a great amount of joy and excitement. It is also a time of considerable changes in the life of the parent(s). Regardless of how many times a mother gives birth, each child brings new challenges and dynamics to the family. It is not unusual to have feelings of excitement accompanied by confusing shifts in moods, emotions, and thoughts. All mothers are at risk of developing postpartum  depression or the "baby blues." These mood changes can occur right after giving birth, or they may occur many months after giving birth. The baby blues or postpartum depression can be mild or severe. Additionally, postpartum depression can resolve rather quickly, or it can be a long-term condition. CAUSES Elevated hormones and their rapid decline are thought to be a main cause of postpartum depression and the baby blues. There are a number of hormones that radically change during and after pregnancy. Estrogen and progesterone usually decrease immediately after delivering your baby. The level of thyroid hormone and various cortisol steroids also rapidly drop. Other factors that play a major role in these changes include major life events and genetics.  RISK FACTORS If you have any of the following risks for the baby blues or postpartum depression, know what symptoms to watch out for during the postpartum period. Risk factors that may increase the likelihood of getting the baby blues or postpartum depression include:  Havinga personal or family history of depression.  Having depression while being pregnant.  Having premenstrual or oral contraceptive-associated mood issues.  Having exceptional life stress.  Having marital conflict.  Lacking a social support network.  Having a baby with special needs.  Having health problems such as diabetes. SYMPTOMS Baby blues symptoms include:  Brief fluctuations in mood, such as going from extreme happiness to sadness.  Decreased concentration.  Difficulty sleeping.  Crying spells, tearfulness.  Irritability.  Anxiety. Postpartum depression symptoms typically begin within the first month after giving birth. These symptoms include:  Difficulty sleeping or excessive sleepiness.  Marked weight loss.  Agitation.  Feelings of worthlessness.  Lack of interest in activity or food. Postpartum psychosis is a very concerning condition and can be  dangerous. Fortunately, it is rare. Displaying any of the following symptoms is cause for immediate medical attention. Postpartum psychosis symptoms include:  Hallucinations and delusions.  Bizarre or disorganized behavior.  Confusion or disorientation. DIAGNOSIS  A diagnosis is made by an evaluation of your symptoms. There are no medical or lab tests that lead to a diagnosis, but there are various questionnaires that a caregiver may use to identify those with the baby blues, postpartum depression, or psychosis. Often times, a screening tool called the [place] Postnatal Depression Scale is used to diagnose depression in the postpartum period.  TREATMENT The baby blues usually goes away on its own in 1 to 2 weeks. Social support is often all that is needed. You should be encouraged to get adequate sleep and rest. Occasionally, you may be given medicines to help you sleep.  Postpartum depression requires treatment as it can last several months or longer if it is not treated. Treatment may include individual or group therapy, medicine, or both to address any social, physiological, and psychological factors that may play a role in the depression. Regular exercise, a healthy diet, rest, and social support may also be strongly recommended.  Postpartum psychosis is more serious and needs treatment right away. Hospitalization is often needed. HOME CARE INSTRUCTIONS  Get as much rest as you can. Nap when the baby sleeps.  Exercise regularly. Some women find yoga and walking to be beneficial.  Eat a balanced and nourishing diet.  Do little things that you enjoy. Have a cup of tea, take a bubble bath, read your favorite magazine, or listen to your favorite music.  Avoid alcohol.  Ask for help with household chores, cooking, grocery shopping, or running errands as needed. Do not try to do everything.  Talk to people close to you about how you are feeling. Get support from your partner, family  members, friends, or other new moms.  Try to stay positive in how you think. Think about the things you are grateful for.  Do not spend a lot of time alone.  Only take medicine as directed by your caregiver.  Keep all your postpartum appointments.  Let your caregiver know if you have any concerns. SEEK MEDICAL CARE IF: You are having a reaction or problems with your medicine. SEEK IMMEDIATE MEDICAL CARE IF:  You have suicidal feelings.  You feel you may harm the baby or someone else. Document Released: 06/29/2004 Document Revised: 12/18/2011 Document Reviewed: 07/07/2013 [facility] Patient Information 2014 [place], [place]. Postpartum Care After Vaginal Delivery After you deliver your newborn (postpartum period), the usual stay in the hospital is 24 72 hours. If there were problems with your labor or delivery, or if you have other medical problems, you might be in the hospital longer.  While you are in the hospital, you will receive help and instructions on how to care for yourself and your newborn during the postpartum period.  While you are in the hospital:  Be sure to tell your nurses if you have pain or discomfort, as well as where you feel the pain and what makes the pain worse.  If you had an incision made near your vagina (episiotomy) or if you had some tearing during delivery, the nurses may put ice packs on your episiotomy or tear. The ice packs may help to reduce the pain and swelling.  If you are breastfeeding, you may feel uncomfortable contractions of your uterus for a couple of weeks. This is normal. The contractions help your uterus get back to normal size.  It is normal to have some bleeding after delivery.  For the first 1 3 days after delivery, the flow is red and the amount may be similar to a period.  It is common for the flow to start and stop.  In the first few days, you may pass some small clots. Let your nurses know if you begin to pass large clots or your  flow increases.  Do not  flush blood clots down the toilet before having the nurse look at them.  During the next 3 10 days after delivery, your flow should become more watery and pink or brown-tinged in color.  Ten to fourteen days after delivery, your flow should be a small amount of yellowish-white discharge.  The amount of your flow will decrease over the first few weeks after delivery. Your flow may stop in 6 8 weeks. Most women have had their flow stop by 12 weeks after delivery.  You should change your sanitary pads frequently.  Wash your hands thoroughly with soap and water for at least 20 seconds after changing pads, using the toilet, or before holding or feeding your newborn.  You should feel like you need to empty your bladder within the first 6 8 hours after delivery.  In case you become weak, lightheaded, or faint, call your nurse before you get out of bed for the first time and before you take a shower for the first time.  Within the first few days after delivery, your breasts may begin to feel tender and full. This is called engorgement. Breast tenderness usually goes away within 48 72 hours after engorgement occurs. You may also notice milk leaking from your breasts. If you are not breastfeeding, do not stimulate your breasts. Breast stimulation can make your breasts produce more milk.  Spending as much time as possible with your newborn is very important. During this time, you and your newborn can feel close and get to know each other. Having your newborn stay in your room (rooming in) will help to strengthen the bond with your newborn. It will give you time to get to know your newborn and become comfortable caring for your newborn.  Your hormones change after delivery. Sometimes the hormone changes can temporarily cause you to feel sad or tearful. These feelings should not last more than a few days. If these feelings last longer than that, you should talk to your  caregiver.  If desired, talk to your caregiver about methods of family planning or contraception.  Talk to your caregiver about immunizations. Your caregiver may want you to have the following immunizations before leaving the hospital:  Tetanus, diphtheria, and pertussis (Tdap) or tetanus and diphtheria (Td) immunization. It is very important that you and your family (including grandparents) or others caring for your newborn are up-to-date with the Tdap or Td immunizations. The Tdap or Td immunization can help  protect your newborn from getting ill.  Rubella immunization.  Varicella (chickenpox) immunization.  Influenza immunization. You should receive this annual immunization if you did not receive the immunization during your pregnancy. Document Released: 07/23/2007 Document Revised: 06/19/2012 Document Reviewed: 05/22/2012 [facility] Patient Information 2014 [place].

## 2014-03-16 NOTE — Lactation Note (Signed)
This note was copied from the chart of Marie Addilyn Whitman. Lactation Consultation Note  Follow up consult:  8% weight loss.  Mother states infant cluster fed the last 2 nights and did not seem satisfied after feedings. Infant has had 3 voids, 2 stools in 24 hours. No stool in 12 hours.  Upon entering room mother is bf, baby latches well but infant will not sustain latch. Mother burped infant and retried using breast massage.  Baby continued to pull off and on breast. Mother states her breasts are starting to feel fuller but infant comes off the breast quickly and cries. Mother massaged and hand expressed both breasts with no colostrum viewed. Had mother use DEBP for 15 min and hand expressed after. No breastmilk pumped with this pumping and last. Suggested mother start supplementing with formula.  Reviewed LEAD. Mother gave infant 81ml of gerber formula.  Reviewed paced feeding and volume guidelines. Plan is for mother to breastfeed first based upon infant's cues and then supplement with formula after.   Mother has a breastpump.  Reviewed engorgement care.     Patient Name: Marie Holt [id number] Date: 03/16/2014 Reason for consult: Infant weight loss   Maternal Data    Feeding Feeding Type: Breast Fed Length of feed: 5 min  LATCH Score/Interventions Latch: Grasps breast easily, tongue down, lips flanged, rhythmical sucking. Intervention(s): Breast massage  Audible Swallowing: None Intervention(s): Hand expression;Skin to skin Intervention(s): Alternate breast massage  Type of Nipple: Everted at rest and after stimulation  Comfort (Breast/Nipple): Filling, red/small blisters or bruises, mild/mod discomfort  Problem noted: Mild/Moderate discomfort Interventions (Mild/moderate discomfort): Hand expression;Pre-pump if needed;Post-pump  Hold (Positioning): No assistance needed to correctly position infant at breast. Intervention(s): Breastfeeding basics reviewed;Support  Pillows;Position options;Skin to skin  LATCH Score: 7  Lactation Tools Discussed/Used Tools: Pump Breast pump type: Double-Electric Breast Pump Pump Review: Setup, frequency, and cleaning Initiated by:: Peri Jefferson RN Date initiated:: 03/16/14   Consult Status Consult Status: PRN Date: 03/16/14 Follow-up type: In-patient    Marie Holt 03/16/2014, 9:32 AM

## 2014-03-16 NOTE — Discharge Summary (Signed)
  Vaginal Delivery Discharge Summary  CRYSTRAL MITCHNER     FEMALE INFANT: "Marie Holt"  DOB:    26-Apr-1991 MRN:    [id number] CSN:    [id number]  Date of admission:                  03/13/14  Date of discharge:                   03/16/14  Procedures this admission:   SVD, repair of redundant vaginal wall tissue on the lower edges of the introitus bilaterally  Date of Delivery: 03/14/14  Newborn Data:  Live born female  Birth Weight: 7 lb 2.3 oz (3240 g) APGAR: 9, 9  Home with mother. Name: [facility] Circumcision Plan: NA  History of Present Illness:  Ms. [name] is a 23 y.o. female, G2P2002, who presents at [redacted]w[redacted]d weeks gestation. The patient has been followed at the [facility]cology division of [facility]. She was admitted in 2nd stage labor. Her pregnancy has been complicated by:  Patient Active Problem List   Diagnosis Date Noted  . Mass on back 01/12/2014  . Kidney stone 04/18/2012  . Lactating mother 04/03/2012  . NSVD (normal spontaneous vaginal delivery) 04/01/2012  . Poor weight gain of pregnancy 03/05/2012  . Rubella non-immune status, antepartum 02/27/2012  . Asthma 01/29/2012  . Poor dentition 01/29/2012  . Irregular menstrual cycle 01/29/2012     Hospital Course:  The pt was admitted in spontaneous labor on 03/13/14. GBS negative. Utilized Epidural for pain management.  Delivery was performed by Dr. Osborn Coho without complication. Patient and baby tolerated the procedure without difficulty, with no laceration noted. Infant status was stable and remained in room with mother.  Mother and infant then had an uncomplicated postpartum course, with bottlefeeding going well. Mom's physical exam was WNL, and she was discharged home in stable condition. Contraception plan was undecided.  She received adequate benefit from po pain medications.   Feeding:  bottle  Contraception:  no method now. Will decide by postpartum  visit.  Discharge hemoglobin:  Hemoglobin  Date Value Ref Range Status  03/15/2014 10.2* 12.0 - 15.0 g/dL Final     HCT  Date Value Ref Range Status  03/15/2014 31.7* 36.0 - 46.0 % Final    Discharge Physical Exam:   General: alert Lochia: appropriate Uterine Fundus: firm Incision: healing well DVT Evaluation: No evidence of DVT seen on physical exam. Negative Homan's sign.  Intrapartum Procedures: spontaneous vaginal delivery Postpartum Procedures: Rubella Ig Complications-Operative and Postpartum: Repair of excision of redundant vaginal wall tissue on the lower edges of the introitus bilaterally  Discharge Diagnoses: Term Pregnancy-delivered  Discharge Information:  Activity:           pelvic rest Diet:                routine Medications: PNV and Ibuprofen Condition:      stable    Discharge to: home  Follow-up Information   Follow up with [facility]. Schedule an appointment as soon as possible for a visit in 6 weeks. (Per [employer]. Call with questions or concerns)    Specialty:  Obstetrics and Gynecology   Contact information:   [address] [place] [address] [phone]       Sherre Scarlet CNM 03/16/2014 10:11 AM

## 2014-03-16 NOTE — Progress Notes (Signed)
Ur chart review completed.  

## 2014-03-16 NOTE — Lactation Note (Signed)
This note was copied from the chart of Marie Holt. Lactation Consultation Note Noted 8% weight loss. Mom feeding baby, baby acting hungry, mom states baby doesn't feed for long periods of time and falls a sleep. Encouraged to unwrap baby and do STS. Encouraged football hold d/t positional stripes to Lt. Nipple. Cheek to breast position. Mom states comfortable. Encouraged positioning and props. Breast long and pendulum shaped, soft, hand expression, no colostrum noted. DEBP set up and demonstrated. Encouraged to Pre-pump and post-pump after feedings and any colostrum noted to give to baby via curve tip syring. Monitor I&O. Patient Name: Marie Holt [id number] Date: 03/16/2014 Reason for consult: Follow-up assessment;Infant weight loss   Maternal Data    Feeding Feeding Type: Breast Fed Length of feed: 15 min  LATCH Score/Interventions Latch: Grasps breast easily, tongue down, lips flanged, rhythmical sucking. Intervention(s): Adjust position;Assist with latch;Breast massage;Breast compression  Audible Swallowing: None Intervention(s): Skin to skin;Hand expression Intervention(s): Skin to skin;Hand expression;Alternate breast massage  Type of Nipple: Everted at rest and after stimulation  Comfort (Breast/Nipple): Filling, red/small blisters or bruises, mild/mod discomfort  Interventions (Mild/moderate discomfort): Hand massage;Hand expression;Pre-pump if needed;Post-pump  Hold (Positioning): Assistance needed to correctly position infant at breast and maintain latch. Intervention(s): Breastfeeding basics reviewed;Support Pillows;Position options;Skin to skin  LATCH Score: 6  Lactation Tools Discussed/Used Tools: Pump Breast pump type: Double-Electric Breast Pump Pump Review: Setup, frequency, and cleaning Initiated by:: Peri Jefferson RN Date initiated:: 03/16/14   Consult Status Consult Status: Follow-up Date: 03/16/14 Follow-up type: In-patient    Charyl Dancer 03/16/2014, 6:54 AM

## 2014-03-18 ENCOUNTER — Inpatient Hospital Stay (HOSPITAL_COMMUNITY): Admission: RE | Admit: 2014-03-18 | Payer: Medicaid Other | Source: Ambulatory Visit

## 2014-05-12 ENCOUNTER — Ambulatory Visit (INDEPENDENT_AMBULATORY_CARE_PROVIDER_SITE_OTHER): Payer: Medicaid Other | Admitting: General Surgery

## 2014-08-10 ENCOUNTER — Encounter (HOSPITAL_COMMUNITY): Payer: Self-pay | Admitting: Obstetrics

## 2015-01-20 ENCOUNTER — Other Ambulatory Visit: Payer: Self-pay | Admitting: Registered Nurse

## 2015-01-20 DIAGNOSIS — N644 Mastodynia: Secondary | ICD-10-CM

## 2015-01-25 ENCOUNTER — Other Ambulatory Visit: Payer: Medicaid Other

## 2015-11-30 ENCOUNTER — Inpatient Hospital Stay (HOSPITAL_COMMUNITY)
Admission: AD | Admit: 2015-11-30 | Discharge: 2015-11-30 | Disposition: A | Discharge status: Home / Self Care | Payer: Medicaid Other | Source: Ambulatory Visit | Attending: Family Medicine | Admitting: Family Medicine

## 2015-11-30 ENCOUNTER — Encounter (HOSPITAL_COMMUNITY): Payer: Self-pay

## 2015-11-30 DIAGNOSIS — A599 Trichomoniasis, unspecified: Secondary | ICD-10-CM | POA: Diagnosis not present

## 2015-11-30 DIAGNOSIS — J45909 Unspecified asthma, uncomplicated: Secondary | ICD-10-CM | POA: Insufficient documentation

## 2015-11-30 DIAGNOSIS — Z885 Allergy status to narcotic agent status: Secondary | ICD-10-CM | POA: Insufficient documentation

## 2015-11-30 DIAGNOSIS — N926 Irregular menstruation, unspecified: Secondary | ICD-10-CM | POA: Insufficient documentation

## 2015-11-30 DIAGNOSIS — N939 Abnormal uterine and vaginal bleeding, unspecified: Secondary | ICD-10-CM | POA: Insufficient documentation

## 2015-11-30 DIAGNOSIS — Z3202 Encounter for pregnancy test, result negative: Secondary | ICD-10-CM | POA: Insufficient documentation

## 2015-11-30 DIAGNOSIS — Z87442 Personal history of urinary calculi: Secondary | ICD-10-CM | POA: Insufficient documentation

## 2015-11-30 LAB — URINALYSIS, ROUTINE W REFLEX MICROSCOPIC
Bilirubin Urine: NEGATIVE
GLUCOSE, UA: NEGATIVE mg/dL
KETONES UR: NEGATIVE mg/dL
Nitrite: NEGATIVE
PROTEIN: NEGATIVE mg/dL
Specific Gravity, Urine: 1.015 (ref 1.005–1.030)
pH: 7 (ref 5.0–8.0)

## 2015-11-30 LAB — CBC WITH DIFFERENTIAL/PLATELET
BASOS PCT: 0 %
Basophils Absolute: 0 10*3/uL (ref 0.0–0.1)
EOS ABS: 0.2 10*3/uL (ref 0.0–0.7)
Eosinophils Relative: 2 %
HCT: 37.9 % (ref 36.0–46.0)
Hemoglobin: 12.8 g/dL (ref 12.0–15.0)
Lymphocytes Relative: 37 %
Lymphs Abs: 3.4 10*3/uL (ref 0.7–4.0)
MCH: 30.4 pg (ref 26.0–34.0)
MCHC: 33.8 g/dL (ref 30.0–36.0)
MCV: 90 fL (ref 78.0–100.0)
MONO ABS: 0.8 10*3/uL (ref 0.1–1.0)
Monocytes Relative: 9 %
NEUTROS PCT: 52 %
Neutro Abs: 4.8 10*3/uL (ref 1.7–7.7)
Platelets: 257 10*3/uL (ref 150–400)
RBC: 4.21 MIL/uL (ref 3.87–5.11)
RDW: 12.5 % (ref 11.5–15.5)
WBC: 9.3 10*3/uL (ref 4.0–10.5)

## 2015-11-30 LAB — WET PREP, GENITAL
Sperm: NONE SEEN
YEAST WET PREP: NONE SEEN

## 2015-11-30 LAB — URINE MICROSCOPIC-ADD ON

## 2015-11-30 LAB — POCT PREGNANCY, URINE: PREG TEST UR: NEGATIVE

## 2015-11-30 MED ORDER — METRONIDAZOLE 500 MG PO TABS
2000.0000 mg | ORAL_TABLET | Freq: Once | ORAL | Status: AC
  Administered 2015-11-30: 2000 mg via ORAL
  Filled 2015-11-30: qty 4

## 2015-11-30 NOTE — MAU Note (Signed)
Pt reports she has been bleeding x 2 weeks and has some pain in her vaginal area. States she thinks she may have an infection.

## 2015-11-30 NOTE — Discharge Instructions (Signed)
Trichomoniasis Trichomoniasis is an infection caused by an organism called Trichomonas. The infection can affect both women and men. In women, the outer female genitalia and the vagina are affected. In men, the penis is mainly affected, but the prostate and other reproductive organs can also be involved. Trichomoniasis is a sexually transmitted infection (STI) and is most often passed to another person through sexual contact.  RISK FACTORS  Having unprotected sexual intercourse.  Having sexual intercourse with an infected partner. SIGNS AND SYMPTOMS  Symptoms of trichomoniasis in women include:  Abnormal gray-green frothy vaginal discharge.  Itching and irritation of the vagina.  Itching and irritation of the area outside the vagina. Symptoms of trichomoniasis in men include:   Penile discharge with or without pain.  Pain during urination. This results from inflammation of the urethra. DIAGNOSIS  Trichomoniasis may be found during a Pap test or physical exam. Your health care provider may use one of the following methods to help diagnose this infection:  Testing the pH of the vagina with a test tape.  Using a vaginal swab test that checks for the Trichomonas organism. A test is available that provides results within a few minutes.  Examining a urine sample.  Testing vaginal secretions. Your health care provider may test you for other STIs, including HIV. TREATMENT   You may be given medicine to fight the infection. Women should inform their health care provider if they could be or are pregnant. Some medicines used to treat the infection should not be taken during pregnancy.  Your health care provider may recommend over-the-counter medicines or creams to decrease itching or irritation.  Your sexual partner will need to be treated if infected.  Your health care provider may test you for infection again 3 months after treatment. HOME CARE INSTRUCTIONS   Take medicines only as  directed by your health care provider.  Take over-the-counter medicine for itching or irritation as directed by your health care provider.  Do not have sexual intercourse while you have the infection.  Women should not douche or wear tampons while they have the infection.  Discuss your infection with your partner. Your partner may have gotten the infection from you, or you may have gotten it from your partner.  Have your sex partner get examined and treated if necessary.  Practice safe, informed, and protected sex.  See your health care provider for other STI testing. SEEK MEDICAL CARE IF:   You still have symptoms after you finish your medicine.  You develop abdominal pain.  You have pain when you urinate.  You have bleeding after sexual intercourse.  You develop a rash.  Your medicine makes you sick or makes you throw up (vomit). MAKE SURE YOU:  Understand these instructions.  Will watch your condition.  Will get help right away if you are not doing well or get worse.   This information is not intended to replace advice given to you by your health care provider. Make sure you discuss any questions you have with your health care provider.   Document Released: 03/21/2001 Document Revised: 10/16/2014 Document Reviewed: 07/07/2013 Elsevier Interactive Patient Education 2016 [employer]vier Inc.  

## 2015-11-30 NOTE — MAU Provider Note (Signed)
History     CSN: [id number]  Arrival date and time: 11/30/15 2129   First Provider Initiated Contact with Patient 11/30/15 2220      Chief Complaint  Patient presents with  . Vaginal Bleeding   HPI [name] is a 25 y.o. [id number] at Unknown who presents to MAU today with complaint of vaginal bleeding and irritation. The patient states that she has Nexplanon for birth control. It was placed after the birth of her last child who is almost 47 years old. She states initially she had irregular bleeding, but now has a period every 2-3 months. She states LMP of 11/15/15 and continued intermittent light bleeding since then. She denies bleeding today, but has noted a lot of white, clumpy discharge. She denies bleeding today. She also denies abdominal pain, fever or UTI symptoms.    OB History    Gravida Para Term Preterm AB TAB SAB Ectopic Multiple Living   Past Medical History  Diagnosis Date  . Asthma   . Right ureteral stone   . Anxiety   . Yeast infection   . 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 Cystoscopy w/ retrogrades  06/03/2012    Procedure: CYSTOSCOPY WITH RETROGRADE PYELOGRAM;  Surgeon: Valetta Fuller, MD;  Location: [facility];  Service: Urology;  Laterality: Right;    Family History  Problem Relation Age of Onset  . Anesthesia problems Neg Hx   . Von Willebrand disease Sister   . Hypertension Maternal Grandmother   . Cancer Paternal Grandmother     unknown origin  . Cancer Paternal Grandfather     unknown origin  . Hypertension Mother   . Cancer Father     lung  . Cancer Paternal Aunt     breast    Social History  Substance Use Topics  . Smoking status: Never Smoker   . Smokeless tobacco: Never Used  . Alcohol Use: No     Comment: occasional    Allergies:  Allergies  Allergen Reactions  . Vicodin  [Hydrocodone-Acetaminophen] Nausea And Vomiting    Prescriptions prior to admission  Medication Sig Dispense Refill Last Dose  . etonogestrel (IMPLANON) 68 MG IMPL implant 1 each by Subdermal route once.   Continuous    Review of Systems  Constitutional: Negative for fever and malaise/fatigue.  Gastrointestinal: Negative for nausea, vomiting, abdominal pain, diarrhea and constipation.  Genitourinary: Negative for dysuria, urgency and frequency.       + vaginal discharge Neg - vaginal bleeding   Physical Exam   Blood pressure 104/70, pulse 74, temperature 98.4 F (36.9 C), temperature source Oral, resp. rate 16, height  (1.575 m), weight 139 lb (63.05 kg), last menstrual period 11/15/2015, SpO2 100 %, unknown if currently breastfeeding.  Physical Exam  Nursing note and vitals reviewed. Constitutional: She is oriented to person, place, and time. She appears well-developed and well-nourished. No distress.  HENT:  Head: Normocephalic and atraumatic.  Cardiovascular: Normal rate.   Respiratory: Effort normal.  GI: Soft. She exhibits no distension and no mass. There is no tenderness. There is no rebound and no guarding.  Genitourinary: Uterus is not enlarged and not tender. Cervix exhibits no motion tenderness, no discharge and no friability. Right adnexum displays no mass and no tenderness. Left adnexum displays  no mass and no tenderness. No bleeding in the vagina. Vaginal discharge (moderate yellow frothy discharge noted) found.  Neurological: She is alert and oriented to person, place, and time.  Skin: Skin is warm and dry. No erythema.  Psychiatric: She has a normal mood and affect.    Results for orders placed or performed during the hospital encounter of 11/30/15 (from the past 24 hour(s))  Urinalysis, Routine w reflex microscopic (not at [facility])     Status: Abnormal   Collection Time: 11/30/15  9:46 PM  Result Value Ref Range   Color, Urine YELLOW YELLOW   APPearance CLEAR  CLEAR   Specific Gravity, Urine 1.015 1.005 - 1.030   pH 7.0 5.0 - 8.0   Glucose, UA NEGATIVE NEGATIVE mg/dL   Hgb urine dipstick TRACE (A) NEGATIVE   Bilirubin Urine NEGATIVE NEGATIVE   Ketones, ur NEGATIVE NEGATIVE mg/dL   Protein, ur NEGATIVE NEGATIVE mg/dL   Nitrite NEGATIVE NEGATIVE   Leukocytes, UA LARGE (A) NEGATIVE  Urine microscopic-add on     Status: Abnormal   Collection Time: 11/30/15  9:46 PM  Result Value Ref Range   Squamous Epithelial / LPF 0-5 (A) NONE SEEN   WBC, UA 6-30 0 - 5 WBC/hpf   RBC / HPF 0-5 0 - 5 RBC/hpf   Bacteria, UA MANY (A) NONE SEEN  Pregnancy, urine POC     Status: None   Collection Time: 11/30/15 10:01 PM  Result Value Ref Range   Preg Test, Ur NEGATIVE NEGATIVE  CBC with Differential/Platelet     Status: None   Collection Time: 11/30/15 10:13 PM  Result Value Ref Range   WBC 9.3 4.0 - 10.5 K/uL   RBC 4.21 3.87 - 5.11 MIL/uL   Hemoglobin 12.8 12.0 - 15.0 g/dL   HCT 16.1 09.6 - 04.5 %   MCV 90.0 78.0 - 100.0 fL   MCH 30.4 26.0 - 34.0 pg   MCHC 33.8 30.0 - 36.0 g/dL   RDW 40.9 81.1 - 91.4 %   Platelets 257 150 - 400 K/uL   Neutrophils Relative % 52 %   Neutro Abs 4.8 1.7 - 7.7 K/uL   Lymphocytes Relative 37 %   Lymphs Abs 3.4 0.7 - 4.0 K/uL   Monocytes Relative 9 %   Monocytes Absolute 0.8 0.1 - 1.0 K/uL   Eosinophils Relative 2 %   Eosinophils Absolute 0.2 0.0 - 0.7 K/uL   Basophils Relative 0 %   Basophils Absolute 0.0 0.0 - 0.1 K/uL  Wet prep, genital     Status: Abnormal   Collection Time: 11/30/15 10:30 PM  Result Value Ref Range   Yeast Wet Prep HPF POC NONE SEEN NONE SEEN   Trich, Wet Prep PRESENT (A) NONE SEEN   Clue Cells Wet Prep HPF POC PRESENT (A) NONE SEEN   WBC, Wet Prep HPF POC TOO NUMEROUS TO COUNT (A) NONE SEEN   Sperm NONE SEEN     MAU Course  Procedures None  MDM UPT - negative UA, CBC, wet prep, GC/Chlmaydia, HIV and RPR today Urine culture pending  2 G Flagyl given in MAU   Assessment and Plan   A: Trichomonas Irregular periods with Nexplanon   P: Discharge home Patient treated with Flagyl in MAU Partner treatment advised  Patient advised to follow-up with PCP or GYN of choice as needed  Patient may return to MAU as needed or if her condition were to change or worsen  Marny Lowenstein, PA-C  11/30/2015,  10:56 PM

## 2015-12-01 LAB — RPR: RPR: NONREACTIVE

## 2015-12-01 LAB — GC/CHLAMYDIA PROBE AMP (~~LOC~~) NOT AT ARMC
CHLAMYDIA, DNA PROBE: NEGATIVE
Neisseria Gonorrhea: NEGATIVE

## 2015-12-01 LAB — HIV ANTIBODY (ROUTINE TESTING W REFLEX): HIV Screen 4th Generation wRfx: NONREACTIVE

## 2015-12-02 LAB — URINE CULTURE

## 2016-01-16 ENCOUNTER — Inpatient Hospital Stay (HOSPITAL_COMMUNITY)
Admission: AD | Admit: 2016-01-16 | Discharge: 2016-01-16 | Disposition: A | Discharge status: Home / Self Care | Payer: Medicaid Other | Source: Ambulatory Visit | Attending: Obstetrics & Gynecology | Admitting: Obstetrics & Gynecology

## 2016-01-16 ENCOUNTER — Encounter (HOSPITAL_COMMUNITY): Payer: Self-pay

## 2016-01-16 DIAGNOSIS — N938 Other specified abnormal uterine and vaginal bleeding: Secondary | ICD-10-CM | POA: Diagnosis not present

## 2016-01-16 DIAGNOSIS — Z79899 Other long term (current) drug therapy: Secondary | ICD-10-CM | POA: Diagnosis not present

## 2016-01-16 DIAGNOSIS — F419 Anxiety disorder, unspecified: Secondary | ICD-10-CM | POA: Insufficient documentation

## 2016-01-16 DIAGNOSIS — N921 Excessive and frequent menstruation with irregular cycle: Secondary | ICD-10-CM | POA: Diagnosis not present

## 2016-01-16 DIAGNOSIS — Z975 Presence of (intrauterine) contraceptive device: Secondary | ICD-10-CM

## 2016-01-16 DIAGNOSIS — Z9689 Presence of other specified functional implants: Secondary | ICD-10-CM | POA: Insufficient documentation

## 2016-01-16 DIAGNOSIS — N939 Abnormal uterine and vaginal bleeding, unspecified: Secondary | ICD-10-CM | POA: Diagnosis present

## 2016-01-16 LAB — URINALYSIS, ROUTINE W REFLEX MICROSCOPIC
Bilirubin Urine: NEGATIVE
GLUCOSE, UA: NEGATIVE mg/dL
KETONES UR: NEGATIVE mg/dL
LEUKOCYTES UA: NEGATIVE
Nitrite: NEGATIVE
PH: 7 (ref 5.0–8.0)
Protein, ur: NEGATIVE mg/dL
Specific Gravity, Urine: 1.02 (ref 1.005–1.030)

## 2016-01-16 LAB — URINE MICROSCOPIC-ADD ON
Bacteria, UA: NONE SEEN
WBC UA: NONE SEEN WBC/hpf (ref 0–5)

## 2016-01-16 LAB — POCT PREGNANCY, URINE: Preg Test, Ur: NEGATIVE

## 2016-01-16 MED ORDER — NORETHINDRONE-ETH ESTRADIOL 0.4-35 MG-MCG PO TABS: 1.0000 | ORAL_TABLET | Freq: Every day | ORAL | Status: DC

## 2016-01-16 NOTE — MAU Note (Signed)
Patient presents to mau with c/o vaginal bleeding x3 weeks; patient states bleeding goes back and forth from heavy to spotting. Denies any pain at present.

## 2016-01-16 NOTE — Discharge Instructions (Signed)
°Abnormal Uterine Bleeding °Abnormal uterine bleeding can affect women at various stages in life, including teenagers, women in their reproductive years, pregnant women, and women who have reached menopause. Several kinds of uterine bleeding are considered abnormal, including: °· Bleeding or spotting between periods.   °· Bleeding after sexual intercourse.   °· Bleeding that is heavier or more than normal.   °· Periods that last longer than usual. °· Bleeding after menopause.   °Many cases of abnormal uterine bleeding are minor and simple to treat, while others are more serious. Any type of abnormal bleeding should be evaluated by your health care provider. Treatment will depend on the cause of the bleeding. °HOME CARE INSTRUCTIONS °Monitor your condition for any changes. The following actions may help to alleviate any discomfort you are experiencing: °· Avoid the use of tampons and douches as directed by your health care provider. °· Change your pads frequently. °You should get regular pelvic exams and Pap tests. Keep all follow-up appointments for diagnostic tests as directed by your health care provider.  °SEEK MEDICAL CARE IF:  °· Your bleeding lasts more than 1 week.   °· You feel dizzy at times.   °SEEK IMMEDIATE MEDICAL CARE IF:  °· You pass out.   °· You are changing pads every 15 to 30 minutes.   °· You have abdominal pain. °· You have a fever.   °· You become sweaty or weak.   °· You are passing large blood clots from the vagina.   °· You start to feel nauseous and vomit. °MAKE SURE YOU:  °· Understand these instructions. °· Will watch your condition. °· Will get help right away if you are not doing well or get worse. °  °This information is not intended to replace advice given to you by your health care provider. Make sure you discuss any questions you have with your health care provider. °  °Document Released: 09/25/2005 Document Revised: 09/30/2013 Document Reviewed: 04/24/2013 °Elsevier Interactive  Patient Education ©2016 [employer] Inc. °Contraception Choices °Contraception (birth control) is the use of any methods or devices to prevent pregnancy. Below are some methods to help avoid pregnancy. °HORMONAL METHODS  °· Contraceptive implant. This is a thin, plastic tube containing progesterone hormone. It does not contain estrogen hormone. Your health care provider inserts the tube in the inner part of the upper arm. The tube can remain in place for up to 3 years. After 3 years, the implant must be removed. The implant prevents the ovaries from releasing an egg (ovulation), thickens the cervical mucus to prevent sperm from entering the uterus, and thins the lining of the inside of the uterus. °· Progesterone-only injections. These injections are given every 3 months by your health care provider to prevent pregnancy. This synthetic progesterone hormone stops the ovaries from releasing eggs. It also thickens cervical mucus and changes the uterine lining. This makes it harder for sperm to survive in the uterus. °· Birth control pills. These pills contain estrogen and progesterone hormone. They work by preventing the ovaries from releasing eggs (ovulation). They also cause the cervical mucus to thicken, preventing the sperm from entering the uterus. Birth control pills are prescribed by a health care provider. Birth control pills can also be used to treat heavy periods. °· Minipill. This type of birth control pill contains only the progesterone hormone. They are taken every day of each month and must be prescribed by your health care provider. °· Birth control patch. The patch contains hormones similar to those in birth control pills. It must be changed once   a week and is prescribed by a health care provider. °· Vaginal ring. The ring contains hormones similar to those in birth control pills. It is left in the vagina for 3 weeks, removed for 1 week, and then a new one is put back in place. The patient must be  comfortable inserting and removing the ring from the vagina. A health care provider's prescription is necessary. °· Emergency contraception. Emergency contraceptives prevent pregnancy after unprotected sexual intercourse. This pill can be taken right after sex or up to 5 days after unprotected sex. It is most effective the sooner you take the pills after having sexual intercourse. Most emergency contraceptive pills are available without a prescription. Check with your pharmacist. Do not use emergency contraception as your only form of birth control. °BARRIER METHODS  °· Female condom. This is a thin sheath (latex or rubber) that is worn over the penis during sexual intercourse. It can be used with spermicide to increase effectiveness. °· Female condom. This is a soft, loose-fitting sheath that is put into the vagina before sexual intercourse. °· Diaphragm. This is a soft, latex, dome-shaped barrier that must be fitted by a health care provider. It is inserted into the vagina, along with a spermicidal jelly. It is inserted before intercourse. The diaphragm should be left in the vagina for 6 to 8 hours after intercourse. °· Cervical cap. This is a round, soft, latex or plastic cup that fits over the cervix and must be fitted by a health care provider. The cap can be left in place for up to 48 hours after intercourse. °· Sponge. This is a soft, circular piece of polyurethane foam. The sponge has spermicide in it. It is inserted into the vagina after wetting it and before sexual intercourse. °· Spermicides. These are chemicals that kill or block sperm from entering the cervix and uterus. They come in the form of creams, jellies, suppositories, foam, or tablets. They do not require a prescription. They are inserted into the vagina with an applicator before having sexual intercourse. The process must be repeated every time you have sexual intercourse. °INTRAUTERINE CONTRACEPTION °· Intrauterine device (IUD). This is a  T-shaped device that is put in a woman's uterus during a menstrual period to prevent pregnancy. There are 2 types: °· Copper IUD. This type of IUD is wrapped in copper wire and is placed inside the uterus. Copper makes the uterus and fallopian tubes produce a fluid that kills sperm. It can stay in place for 10 years. °· Hormone IUD. This type of IUD contains the hormone progestin (synthetic progesterone). The hormone thickens the cervical mucus and prevents sperm from entering the uterus, and it also thins the uterine lining to prevent implantation of a fertilized egg. The hormone can weaken or kill the sperm that get into the uterus. It can stay in place for 3-5 years, depending on which type of IUD is used. °PERMANENT METHODS OF CONTRACEPTION °· Female tubal ligation. This is when the woman's fallopian tubes are surgically sealed, tied, or blocked to prevent the egg from traveling to the uterus. °· Hysteroscopic sterilization. This involves placing a small coil or insert into each fallopian tube. Your doctor uses a technique called hysteroscopy to do the procedure. The device causes scar tissue to form. This results in permanent blockage of the fallopian tubes, so the sperm cannot fertilize the egg. It takes about 3 months after the procedure for the tubes to become blocked. You must use another form of birth control for   these 3 months. °· Female sterilization. This is when the female has the tubes that carry sperm tied off (vasectomy). This blocks sperm from entering the vagina during sexual intercourse. After the procedure, the man can still ejaculate fluid (semen). °NATURAL PLANNING METHODS °· Natural family planning. This is not having sexual intercourse or using a barrier method (condom, diaphragm, cervical cap) on days the woman could become pregnant. °· Calendar method. This is keeping track of the length of each menstrual cycle and identifying when you are fertile. °· Ovulation method. This is avoiding sexual  intercourse during ovulation. °· Symptothermal method. This is avoiding sexual intercourse during ovulation, using a thermometer and ovulation symptoms. °· Post-ovulation method. This is timing sexual intercourse after you have ovulated. °Regardless of which type or method of contraception you choose, it is important that you use condoms to protect against the transmission of sexually transmitted infections (STIs). Talk with your health care provider about which form of contraception is most appropriate for you. °  °This information is not intended to replace advice given to you by your health care provider. Make sure you discuss any questions you have with your health care provider. °  °Document Released: 09/25/2005 Document Revised: 09/30/2013 Document Reviewed: 03/20/2013 °Elsevier Interactive Patient Education ©2016 [employer] Inc. ° °

## 2016-01-16 NOTE — MAU Provider Note (Signed)
Chief Complaint:  Vaginal Bleeding   First Provider Initiated Contact with Patient 01/16/16 2143     HPI  Marie Holt is a 25 y.o. [id number] who presents to maternity admissions reporting bleeding off and on for the past 3 weeks.  Has had a Nexplanon for 2 years, placed by [facility]. Has not been back to their office. .States is not concerned over the amount of blood, but just wants it to stop.  Was told the bleeding was from the [place], but then it did not stop. She reports vaginal bleeding, but no vaginal itching/burning, urinary symptoms, h/a, dizziness, n/v, or fever/chills.    Recently treated for Trichomonas.  States partner was treated also.  RN Note: Patient presents to mau with c/o vaginal bleeding x3 weeks; patient states bleeding goes back and forth from heavy to spotting. Denies any pain at present.          Past Medical History: Past Medical History  Diagnosis Date  . Asthma   . Right ureteral stone   . Anxiety   . Yeast infection   . Intervertebral disc protrusion     small central disc protrusion at L4-L5    Past obstetric history: OB History  Gravida Para Term Preterm AB SAB TAB Ectopic Multiple Living  2 2 2       2     # Outcome Date GA Lbr Len/2nd Weight Sex Delivery Anes PTL Lv  2 Term 03/14/14 4222w3d 04:28 / 00:44 3.24 kg (7 lb 2.3 oz) F Vag-Spont EPI  Y  1 Term 04/01/12 7470w5d 11:11 / 00:53 2.56 kg (5 lb 10.3 oz) F Vag-Spont EPI  Y     Comments: none      Past Surgical History: Past Surgical History  Procedure Laterality Date  . Cystoscopy w/ retrogrades  06/03/2012    Procedure: CYSTOSCOPY WITH RETROGRADE PYELOGRAM;  Surgeon: Valetta Fulleravid S Grapey, MD;  [facility];  Service: Urology;  Laterality: Right;    Family History: Family History  Problem Relation Age of Onset  . Anesthesia problems Neg Hx   . Von Willebrand disease Sister   . Hypertension Maternal Grandmother   . Cancer Paternal Grandmother     unknown origin  . Cancer  Paternal Grandfather     unknown origin  . Hypertension Mother   . Cancer Father     lung  . Cancer Paternal Aunt     breast    Social History: Social History  Substance Use Topics  . Smoking status: Never Smoker   . Smokeless tobacco: Never Used  . Alcohol Use: No     Comment: occasional    Allergies:  Allergies  Allergen Reactions  . Vicodin [Hydrocodone-Acetaminophen] Nausea And Vomiting    Meds:  Prescriptions prior to admission  Medication Sig Dispense Refill Last Dose  . etonogestrel (IMPLANON) 68 MG IMPL implant 1 each by Subdermal route once.   2015 at 2015    I have reviewed patient's Past Medical Hx, Surgical Hx, Family Hx, Social Hx, medications and allergies.  ROS:  Review of Systems  Constitutional: Positive for fatigue. Negative for fever and chills.  Respiratory: Negative for shortness of breath.   Gastrointestinal: Negative for nausea, vomiting, abdominal pain, diarrhea and constipation.  Genitourinary: Positive for vaginal bleeding and menstrual problem. Negative for dysuria, vaginal discharge and vaginal pain.  Musculoskeletal: Negative for back pain.  Neurological: Positive for light-headedness. Negative for dizziness.   Other systems negative  Physical Exam  Patient Vitals  for the past 24 hrs:  BP Temp Temp src Pulse Resp SpO2 Height Weight  01/16/16 2136 131/84 mmHg 98.3 F (36.8 C) Oral 80 18 99 %  (1.575 m) 60.691 kg (133 lb 12.8 oz)   Constitutional: Well-developed, well-nourished female in no acute distress.  Cardiovascular: normal rate and rhythm, no ectopy audible, S1 & S2 heard, no murmur Respiratory: normal effort, no distress. Lungs CTAB with no wheezes or crackles GI: Abd soft, non-tender.  Nondistended.  No rebound, No guarding.  Bowel Sounds audible  MS: Extremities nontender, no edema, normal ROM Neurologic: Alert and oriented x 4.   Grossly nonfocal. GU: Neg CVAT. Skin:  Warm and Dry Psych:  Affect appropriate.  PELVIC  EXAM: Cervix pink, visually closed, without lesion, small to moderated dark red/brown  discharge, vaginal walls and external genitalia normal Bimanual exam: Cervix firm, anterior, neg CMT, uterus nontender, nonenlarged, adnexa without tenderness, enlargement, or mass    Labs: Results for orders placed or performed during the hospital encounter of 01/16/16 (from the past 24 hour(s))  Urinalysis, Routine w reflex microscopic (not at [facility])     Status: Abnormal   Collection Time: 01/16/16  9:35 PM  Result Value Ref Range   Color, Urine YELLOW YELLOW   APPearance CLEAR CLEAR   Specific Gravity, Urine 1.020 1.005 - 1.030   pH 7.0 5.0 - 8.0   Glucose, UA NEGATIVE NEGATIVE mg/dL   Hgb urine dipstick SMALL (A) NEGATIVE   Bilirubin Urine NEGATIVE NEGATIVE   Ketones, ur NEGATIVE NEGATIVE mg/dL   Protein, ur NEGATIVE NEGATIVE mg/dL   Nitrite NEGATIVE NEGATIVE   Leukocytes, UA NEGATIVE NEGATIVE  Urine microscopic-add on     Status: Abnormal   Collection Time: 01/16/16  9:35 PM  Result Value Ref Range   Squamous Epithelial / LPF 0-5 (A) NONE SEEN   WBC, UA NONE SEEN 0 - 5 WBC/hpf   RBC / HPF 0-5 0 - 5 RBC/hpf   Bacteria, UA NONE SEEN NONE SEEN  Pregnancy, urine POC     Status: None   Collection Time: 01/16/16  9:59 PM  Result Value Ref Range   Preg Test, Ur NEGATIVE NEGATIVE    Declcines CBC, last Hgb 12.8 in February  Imaging:  No results found.  MAU Course/MDM: I have ordered labs as follows: UA Imaging ordered: none Results reviewed.   Discussed abnormal bleeding on Nexplanon. States [facility] told her it would only last for 6 months. Offered OCPs for temporary reset/control of bleeding. Agrees. No smoking or hypertension. Recommend continuous for 2 mos discarding inactive week.   Assessment: Breakthrough bleeding on Nexplanon  Plan: Discharge home Recommend Temporary OCPS Rx sent for Ovcon 35 for short term bleeding control Followup with Family Doctor to discuss removal if  desired.  Encouraged her to keep for one year if we can control bleeding     Medication List    ASK your doctor about these medications        IMPLANON 68 MG Impl implant  Generic drug:  etonogestrel  1 each by Subdermal route once.       Encouraged to return here or to other Urgent Care/ED if she develops worsening of symptoms, increase in pain, fever, or other concerning symptoms.   Wynelle Bourgeois CNM, MSN Certified Nurse-Midwife 01/16/2016 10:52 PM

## 2016-02-12 ENCOUNTER — Inpatient Hospital Stay (HOSPITAL_COMMUNITY)
Admission: AD | Admit: 2016-02-12 | Discharge: 2016-02-12 | Disposition: A | Discharge status: Home / Self Care | Payer: Medicaid Other | Source: Ambulatory Visit | Attending: Obstetrics & Gynecology | Admitting: Obstetrics & Gynecology

## 2016-02-12 ENCOUNTER — Encounter (HOSPITAL_COMMUNITY): Payer: Self-pay | Admitting: *Deleted

## 2016-02-12 DIAGNOSIS — R350 Frequency of micturition: Secondary | ICD-10-CM | POA: Diagnosis present

## 2016-02-12 DIAGNOSIS — F419 Anxiety disorder, unspecified: Secondary | ICD-10-CM | POA: Diagnosis not present

## 2016-02-12 DIAGNOSIS — N390 Urinary tract infection, site not specified: Secondary | ICD-10-CM | POA: Diagnosis not present

## 2016-02-12 DIAGNOSIS — Z885 Allergy status to narcotic agent status: Secondary | ICD-10-CM | POA: Diagnosis not present

## 2016-02-12 DIAGNOSIS — Z87442 Personal history of urinary calculi: Secondary | ICD-10-CM | POA: Diagnosis not present

## 2016-02-12 DIAGNOSIS — N939 Abnormal uterine and vaginal bleeding, unspecified: Secondary | ICD-10-CM | POA: Insufficient documentation

## 2016-02-12 DIAGNOSIS — Z3202 Encounter for pregnancy test, result negative: Secondary | ICD-10-CM | POA: Diagnosis not present

## 2016-02-12 DIAGNOSIS — J45909 Unspecified asthma, uncomplicated: Secondary | ICD-10-CM | POA: Insufficient documentation

## 2016-02-12 DIAGNOSIS — N3000 Acute cystitis without hematuria: Secondary | ICD-10-CM | POA: Diagnosis not present

## 2016-02-12 LAB — URINE MICROSCOPIC-ADD ON

## 2016-02-12 LAB — URINALYSIS, ROUTINE W REFLEX MICROSCOPIC
BILIRUBIN URINE: NEGATIVE
Glucose, UA: NEGATIVE mg/dL
Ketones, ur: NEGATIVE mg/dL
LEUKOCYTES UA: NEGATIVE
NITRITE: NEGATIVE
Protein, ur: NEGATIVE mg/dL
SPECIFIC GRAVITY, URINE: 1.01 (ref 1.005–1.030)
pH: 8 (ref 5.0–8.0)

## 2016-02-12 LAB — POCT PREGNANCY, URINE: PREG TEST UR: NEGATIVE

## 2016-02-12 MED ORDER — CIPROFLOXACIN HCL 250 MG PO TABS: 250.0000 mg | ORAL_TABLET | Freq: Two times a day (BID) | ORAL | Status: DC

## 2016-02-12 NOTE — MAU Provider Note (Signed)
History     CSN: [id number]  Arrival date and time: 02/12/16 1954   First Provider Initiated Contact with Patient 02/12/16 2018      Chief Complaint  Patient presents with  . Urinary Tract Infection   HPI [name] is a 25 y.o. [id number] who presents to MAU today with complaint of urinary frequency. The patient denies urgency, dysuria, abdominal pain, flank pain or fever today. She has had irregular vaginal bleeding with nexplanon and recently had this removed. She was started on OCps this week. She continues to have mild bleeding today.   OB History    Gravida Para Term Preterm AB TAB SAB Ectopic Multiple Living   2 2 2       2       Past Medical History  Diagnosis Date  . Asthma   . Right ureteral stone   . Anxiety   . Yeast infection   . 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 Cystoscopy w/ retrogrades  06/03/2012    Procedure: CYSTOSCOPY WITH RETROGRADE PYELOGRAM;  Surgeon: Valetta Fulleravid S Grapey, MD;  [facility] [place];  Service: Urology;  Laterality: Right;    Family History  Problem Relation Age of Onset  . Anesthesia problems Neg Hx   . Von Willebrand disease Sister   . Hypertension Maternal Grandmother   . Cancer Paternal Grandmother     unknown origin  . Cancer Paternal Grandfather     unknown origin  . Hypertension Mother   . Cancer Father     lung  . Cancer Paternal Aunt     breast    Social History  Substance Use Topics  . Smoking status: Never Smoker   . Smokeless tobacco: Never Used  . Alcohol Use: No     Comment: occasional    Allergies:  Allergies  Allergen Reactions  . Vicodin [Hydrocodone-Acetaminophen] Nausea And Vomiting    No prescriptions prior to admission    Review of Systems  Constitutional: Negative for fever and malaise/fatigue.  Gastrointestinal: Negative for nausea, vomiting, abdominal pain, diarrhea and  constipation.  Genitourinary: Positive for frequency. Negative for dysuria, urgency and flank pain.       + vaginal bleeding   Physical Exam   Blood pressure 114/67, pulse 72, temperature 98.3 F (36.8 C), resp. rate 18, height 5\' 2"  (1.575 m), weight 122 lb (55.339 kg), unknown if currently breastfeeding.  Physical Exam  Nursing note and vitals reviewed. Constitutional: She is oriented to person, place, and time. She appears well-developed and well-nourished. No distress.  HENT:  Head: Normocephalic and atraumatic.  Cardiovascular: Normal rate.   Respiratory: Effort normal.  GI: Soft. She exhibits no distension and no mass. There is no tenderness. There is no rebound, no guarding and no CVA tenderness.  Neurological: She is alert and oriented to person, place, and time.  Skin: Skin is warm and dry. No erythema.  Psychiatric: She has a normal mood and affect.     Results for orders placed or performed during the hospital encounter of 02/12/16 (from the past 24 hour(s))  Urinalysis, Routine w reflex microscopic (not at [facility])     Status: Abnormal   Collection Time: 02/12/16  8:05 PM  Result Value Ref Range   Color, Urine YELLOW YELLOW   APPearance CLOUDY (A) CLEAR   Specific Gravity, Urine 1.010 1.005 - 1.030   pH 8.0  5.0 - 8.0   Glucose, UA NEGATIVE NEGATIVE mg/dL   Hgb urine dipstick LARGE (A) NEGATIVE   Bilirubin Urine NEGATIVE NEGATIVE   Ketones, ur NEGATIVE NEGATIVE mg/dL   Protein, ur NEGATIVE NEGATIVE mg/dL   Nitrite NEGATIVE NEGATIVE   Leukocytes, UA NEGATIVE NEGATIVE  Urine microscopic-add on     Status: Abnormal   Collection Time: 02/12/16  8:05 PM  Result Value Ref Range   Squamous Epithelial / LPF 0-5 (A) NONE SEEN   WBC, UA 0-5 0 - 5 WBC/hpf   RBC / HPF 0-5 0 - 5 RBC/hpf   Bacteria, UA RARE (A) NONE SEEN   Urine-Other AMORPHOUS URATES/PHOSPHATES   Pregnancy, urine POC     Status: None   Collection Time: 02/12/16  8:15 PM  Result Value Ref Range   Preg Test,  Ur NEGATIVE NEGATIVE    MAU Course  Procedures None  MDM UPT - negative UA today  Urine culture pending. Will treat based on symptoms with Cipro Patient advised that OCPs should stop bleeding soon and eventually regulate periods. If this is not the case for her she has been advised to follow-up with [facility].   Assessment and Plan  A: Urinary Frequency UTI Abnormal Uterine Bleeding   P:  Discharge home Rx for Cipro given to patient  Warning signs for worsening condition discussed Patient advised to follow-up with [facility] if symptoms persist or worsen or if bleeding remains unresponsive to OCPs Patient may return to MAU as needed or if her condition were to change or worsen  Marny Lowenstein, PA-C  02/12/2016, 8:49 PM

## 2016-02-12 NOTE — Discharge Instructions (Signed)

## 2016-02-12 NOTE — MAU Note (Addendum)
I think i have a uti and feels weird when i pee. Peeing a lot. Symptoms for last day or so. No pain. Had Nexplanon removed 5/3 by [facility] due to irreg bleeding a lot. Taking BCPs now.  Have been bleeding some off and on for couple months.

## 2016-02-14 LAB — URINE CULTURE: Culture: 60000 — AB

## 2016-03-23 ENCOUNTER — Encounter: Payer: Medicaid Other | Admitting: Obstetrics and Gynecology

## 2016-05-11 ENCOUNTER — Inpatient Hospital Stay (HOSPITAL_COMMUNITY)
Admission: AD | Admit: 2016-05-11 | Discharge: 2016-05-11 | Disposition: A | Discharge status: Home / Self Care | Payer: Medicaid Other | Source: Ambulatory Visit | Attending: Obstetrics & Gynecology | Admitting: Obstetrics & Gynecology

## 2016-05-11 ENCOUNTER — Encounter (HOSPITAL_COMMUNITY): Payer: Self-pay | Admitting: *Deleted

## 2016-05-11 ENCOUNTER — Inpatient Hospital Stay (HOSPITAL_COMMUNITY): Payer: Medicaid Other

## 2016-05-11 DIAGNOSIS — R109 Unspecified abdominal pain: Secondary | ICD-10-CM | POA: Insufficient documentation

## 2016-05-11 DIAGNOSIS — O26899 Other specified pregnancy related conditions, unspecified trimester: Secondary | ICD-10-CM

## 2016-05-11 DIAGNOSIS — Z3A13 13 weeks gestation of pregnancy: Secondary | ICD-10-CM | POA: Insufficient documentation

## 2016-05-11 DIAGNOSIS — O26891 Other specified pregnancy related conditions, first trimester: Secondary | ICD-10-CM | POA: Insufficient documentation

## 2016-05-11 DIAGNOSIS — O3680X0 Pregnancy with inconclusive fetal viability, not applicable or unspecified: Secondary | ICD-10-CM

## 2016-05-11 LAB — URINALYSIS, ROUTINE W REFLEX MICROSCOPIC
Bilirubin Urine: NEGATIVE
Glucose, UA: NEGATIVE mg/dL
Ketones, ur: NEGATIVE mg/dL
Leukocytes, UA: NEGATIVE
NITRITE: NEGATIVE
PROTEIN: NEGATIVE mg/dL
Specific Gravity, Urine: >1.03 — ABNORMAL HIGH (ref 1.005–1.030)
pH: 6 (ref 5.0–8.0)

## 2016-05-11 LAB — URINE MICROSCOPIC-ADD ON

## 2016-05-11 LAB — POCT PREGNANCY, URINE: PREG TEST UR: POSITIVE — AB

## 2016-05-11 LAB — HCG, QUANTITATIVE, PREGNANCY: hCG, Beta Chain, Quant, S: 769 m[IU]/mL — ABNORMAL HIGH (ref <5)

## 2016-05-11 NOTE — MAU Provider Note (Addendum)
History     CSN: [id number]  Arrival date and time: 05/11/16 1349   First Provider Initiated Contact with Patient 05/11/16 1428      Chief Complaint  Patient presents with  . Abdominal Pain   25 y.o. [id number] at [redacted]w[redacted]d by LMP here today with report of worsening abdominal pain.    The history is provided by the patient.  Abdominal Pain  The primary symptoms of the illness include abdominal pain. The primary symptoms of the illness do not include fever, fatigue, shortness of breath, nausea, vomiting, hematochezia, dysuria, vaginal discharge or vaginal bleeding. The current episode started more than 2 days ago (Started about 3-4 days ago). The onset of the illness was gradual. The problem has been gradually worsening.  The illness is associated with awakening from sleep. The patient states that she believes she is currently pregnant. The patient has not had a change in bowel habit. Symptoms associated with the illness do not include chills, anorexia, diaphoresis, heartburn, constipation, urgency, hematuria, frequency or back pain.    Obstetric History   G3   P2   T2   P0   A0   L2    SAB0   TAB0   Ectopic0   Multiple0   Live Births2     # Outcome Date GA Lbr Len/2nd Weight Sex Delivery Anes PTL Lv  3 Current           2 Term 03/14/14 [redacted]w[redacted]d 04:28 / 00:44 7 lb 2.3 oz (3.24 kg) F Vag-Spont EPI  LIV     Name: Misch,GIRL Shallon     Apgar1:  9                Apgar5: 9  1 Term 04/01/12 [redacted]w[redacted]d 11:11 / 00:53 5 lb 10.3 oz (2.56 kg) F Vag-Spont EPI  LIV     Name: Roseboom,GIRL Aracely     Apgar1:  9               Apgar5: 10       Past Medical History:  Diagnosis Date  . Anxiety   . Asthma   . 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 Right ureteral stone   . Yeast infection     Past Surgical History:  Procedure Laterality Date  . CYSTOSCOPY W/ RETROGRADES  06/03/2012   Procedure: CYSTOSCOPY WITH RETROGRADE PYELOGRAM;  Surgeon: Valetta Fuller, MD;   Location: [facility];  Service: Urology;  Laterality: Right;    Family History  Problem Relation Age of Onset  . Hypertension Mother   . Cancer Father     lung  . Cancer Paternal Aunt     breast  . Von Willebrand disease Sister   . Hypertension Maternal Grandmother   . Cancer Paternal Grandmother     unknown origin  . Cancer Paternal Grandfather     unknown origin  . Anesthesia problems Neg Hx     Social History  Substance Use Topics  . Smoking status: Never Smoker  . Smokeless tobacco: Never Used  . Alcohol use No     Comment: occasional    Allergies:  Allergies  Allergen Reactions  . Vicodin [Hydrocodone-Acetaminophen] Nausea And Vomiting    Prescriptions Prior to Admission  Medication Sig Dispense Refill Last Dose  . norethindrone-ethinyl estradiol (OVCON-35, 28,) 0.4-35 MG-MCG tablet Take 1 tablet by mouth daily. (Patient not taking: Reported on 05/11/2016) 1 Package 11 Not Taking at Unknown time  Review of Systems  Constitutional: Negative for chills, diaphoresis, fatigue and fever.  Respiratory: Negative for shortness of breath.   Gastrointestinal: Positive for abdominal pain. Negative for anorexia, constipation, heartburn, hematochezia, nausea and vomiting.  Genitourinary: Negative for dysuria, frequency, hematuria, urgency, vaginal bleeding and vaginal discharge.  Musculoskeletal: Negative for back pain.  All other systems reviewed and are negative.  Physical Exam   Blood pressure 102/69, pulse 85, temperature 98.3 F (36.8 C), temperature source Oral, resp. rate 16, height 5' 2.5" (1.588 m), weight 126 lb 6.4 oz (57.3 kg), last menstrual period 02/09/2016, unknown if currently breastfeeding.  Physical Exam  Constitutional: She is oriented to person, place, and time. She appears well-developed and well-nourished.  HENT:  Head: Normocephalic and atraumatic.  Eyes: Conjunctivae are normal. Pupils are equal, round, and reactive to light.   Neck: Normal range of motion. Neck supple.  Cardiovascular: Normal rate.   Respiratory: Effort normal and breath sounds normal.  GI: Soft. She exhibits no distension and no mass. There is tenderness. There is no rebound and no guarding.  Diffuse mild lower abdominal tenderness  Genitourinary:  Genitourinary Comments: Deferred  Musculoskeletal: Normal range of motion.  Neurological: She is alert and oriented to person, place, and time.  Skin: Skin is warm and dry.  Psychiatric: She has a normal mood and affect. Her behavior is normal. Thought content normal.    MAU Course  Procedures  MDM Bedside scan did not show anything in the uterus. Formal scan ordered. Patient's blood type is O pos based on chart review Quantitative HCG ordered   Results for orders placed or performed during the hospital encounter of 05/11/16 (from the past 24 hour(s))  Urinalysis, Routine w reflex microscopic (not at [facility])     Status: Abnormal   Collection Time: 05/11/16  1:55 PM  Result Value Ref Range   Color, Urine YELLOW YELLOW   APPearance CLEAR CLEAR   Specific Gravity, Urine >1.030 (H) 1.005 - 1.030   pH 6.0 5.0 - 8.0   Glucose, UA NEGATIVE NEGATIVE mg/dL   Hgb urine dipstick SMALL (A) NEGATIVE   Bilirubin Urine NEGATIVE NEGATIVE   Ketones, ur NEGATIVE NEGATIVE mg/dL   Protein, ur NEGATIVE NEGATIVE mg/dL   Nitrite NEGATIVE NEGATIVE   Leukocytes, UA NEGATIVE NEGATIVE  Urine microscopic-add on     Status: Abnormal   Collection Time: 05/11/16  1:55 PM  Result Value Ref Range   Squamous Epithelial / LPF 0-5 (A) NONE SEEN   WBC, UA 0-5 0 - 5 WBC/hpf   RBC / HPF 0-5 0 - 5 RBC/hpf   Bacteria, UA MANY (A) NONE SEEN   Urine-Other MUCOUS PRESENT   Pregnancy, urine POC     Status: Abnormal   Collection Time: 05/11/16  2:04 PM  Result Value Ref Range   Preg Test, Ur POSITIVE (A) NEGATIVE  hCG, quantitative, pregnancy     Status: Abnormal   Collection Time: 05/11/16  2:50 PM  Result Value Ref  Range   hCG, Beta Chain, Quant, S 769 (H) <5 mIU/mL   US Ob Comp Less 14 Wks  Result Date: 05/11/2016 CLINICAL DATA:  Early pregnancy with cramping pelvic pain for 4 days. EXAM: OBSTETRIC <14 WK [place] AND TRANSVAGINAL OB US TECHNIQUE: Both transabdominal and transvaginal ultrasound examinations were performed for complete evaluation of the gestation as well as the maternal uterus, adnexal regions, and pelvic cul-de-sac. Transvaginal technique was performed to assess early pregnancy. COMPARISON:  None. FINDINGS: Intrauterine gestational sac: None Yolk sac:  N/A Embryo:  N/A Cardiac Activity: N/A Heart Rate: N/A  bpm Subchorionic hemorrhage:  None visualized. Maternal uterus/adnexae: Normal right ovary measuring 2.3 x 3.6 x 2.8 cm. Normal left ovary measuring 2.2 x 1.7 x 2.3 cm No free pelvic fluid collections. IMPRESSION: Normal sonographic appearance of the uterus and ovaries. No findings for intrauterine gestational sac or tubal pregnancy. Electronically Signed   By: Rudie Meyer M.D.   On: 05/11/2016 15:41   US Ob Transvaginal  Result Date: 05/11/2016 CLINICAL DATA:  Early pregnancy with cramping pelvic pain for 4 days. EXAM: OBSTETRIC <14 WK [place] AND TRANSVAGINAL OB US TECHNIQUE: Both transabdominal and transvaginal ultrasound examinations were performed for complete evaluation of the gestation as well as the maternal uterus, adnexal regions, and pelvic cul-de-sac. Transvaginal technique was performed to assess early pregnancy. COMPARISON:  None. FINDINGS: Intrauterine gestational sac: None Yolk sac:  N/A Embryo:  N/A Cardiac Activity: N/A Heart Rate: N/A  bpm Subchorionic hemorrhage:  None visualized. Maternal uterus/adnexae: Normal right ovary measuring 2.3 x 3.6 x 2.8 cm. Normal left ovary measuring 2.2 x 1.7 x 2.3 cm No free pelvic fluid collections. IMPRESSION: Normal sonographic appearance of the uterus and ovaries. No findings for intrauterine gestational sac or tubal pregnancy. Electronically Signed    By: Rudie Meyer M.D.   On: 05/11/2016 15:41    Assessment and Plan  Pregnancy of unknown anatomic location Abdominal pain affecting pregnancy  Patient will return to MAU in 48 hours Ectopic precautions reviewed   Kahlani Graber A, MD 05/11/2016, 4:08 PM

## 2016-05-11 NOTE — Discharge Instructions (Signed)

## 2016-05-12 LAB — GC/CHLAMYDIA PROBE AMP (~~LOC~~) NOT AT ARMC

## 2016-05-25 ENCOUNTER — Inpatient Hospital Stay (HOSPITAL_COMMUNITY)
Admission: AD | Admit: 2016-05-25 | Discharge: 2016-05-25 | Disposition: A | Discharge status: Home / Self Care | Payer: Medicaid Other | Source: Ambulatory Visit | Attending: Obstetrics and Gynecology | Admitting: Obstetrics and Gynecology

## 2016-05-25 ENCOUNTER — Inpatient Hospital Stay (HOSPITAL_COMMUNITY): Payer: Medicaid Other

## 2016-05-25 ENCOUNTER — Encounter (HOSPITAL_COMMUNITY): Payer: Self-pay

## 2016-05-25 DIAGNOSIS — O99512 Diseases of the respiratory system complicating pregnancy, second trimester: Secondary | ICD-10-CM | POA: Insufficient documentation

## 2016-05-25 DIAGNOSIS — Z87442 Personal history of urinary calculi: Secondary | ICD-10-CM | POA: Diagnosis not present

## 2016-05-25 DIAGNOSIS — O9989 Other specified diseases and conditions complicating pregnancy, childbirth and the puerperium: Secondary | ICD-10-CM | POA: Diagnosis not present

## 2016-05-25 DIAGNOSIS — O3680X0 Pregnancy with inconclusive fetal viability, not applicable or unspecified: Secondary | ICD-10-CM

## 2016-05-25 DIAGNOSIS — O219 Vomiting of pregnancy, unspecified: Secondary | ICD-10-CM | POA: Diagnosis not present

## 2016-05-25 DIAGNOSIS — R109 Unspecified abdominal pain: Secondary | ICD-10-CM | POA: Insufficient documentation

## 2016-05-25 DIAGNOSIS — J45909 Unspecified asthma, uncomplicated: Secondary | ICD-10-CM | POA: Diagnosis not present

## 2016-05-25 DIAGNOSIS — F419 Anxiety disorder, unspecified: Secondary | ICD-10-CM | POA: Insufficient documentation

## 2016-05-25 DIAGNOSIS — Z3A22 22 weeks gestation of pregnancy: Secondary | ICD-10-CM | POA: Diagnosis not present

## 2016-05-25 DIAGNOSIS — Z3491 Encounter for supervision of normal pregnancy, unspecified, first trimester: Secondary | ICD-10-CM

## 2016-05-25 DIAGNOSIS — O26892 Other specified pregnancy related conditions, second trimester: Secondary | ICD-10-CM | POA: Insufficient documentation

## 2016-05-25 DIAGNOSIS — O99342 Other mental disorders complicating pregnancy, second trimester: Secondary | ICD-10-CM | POA: Insufficient documentation

## 2016-05-25 DIAGNOSIS — O26899 Other specified pregnancy related conditions, unspecified trimester: Secondary | ICD-10-CM

## 2016-05-25 DIAGNOSIS — E860 Dehydration: Secondary | ICD-10-CM | POA: Diagnosis not present

## 2016-05-25 LAB — URINALYSIS, ROUTINE W REFLEX MICROSCOPIC
Bilirubin Urine: NEGATIVE
GLUCOSE, UA: NEGATIVE mg/dL
Ketones, ur: 40 mg/dL — AB
Nitrite: NEGATIVE
PH: 6.5 (ref 5.0–8.0)
PROTEIN: 30 mg/dL — AB
Specific Gravity, Urine: 1.025 (ref 1.005–1.030)

## 2016-05-25 LAB — COMPREHENSIVE METABOLIC PANEL
ALBUMIN: 4.5 g/dL (ref 3.5–5.0)
ALT: 10 U/L — AB (ref 14–54)
AST: 13 U/L — AB (ref 15–41)
Alkaline Phosphatase: 52 U/L (ref 38–126)
Anion gap: 9 (ref 5–15)
BILIRUBIN TOTAL: 1.3 mg/dL — AB (ref 0.3–1.2)
BUN: 8 mg/dL (ref 6–20)
CO2: 21 mmol/L — ABNORMAL LOW (ref 22–32)
CREATININE: 0.71 mg/dL (ref 0.44–1.00)
Calcium: 9.9 mg/dL (ref 8.9–10.3)
Chloride: 105 mmol/L (ref 101–111)
GFR calc Af Amer: >60 mL/min (ref >60)
GLUCOSE: 108 mg/dL — AB (ref 65–99)
POTASSIUM: 3.7 mmol/L (ref 3.5–5.1)
Sodium: 135 mmol/L (ref 135–145)
TOTAL PROTEIN: 7.9 g/dL (ref 6.5–8.1)

## 2016-05-25 LAB — CBC
HEMATOCRIT: 40.5 % (ref 36.0–46.0)
HEMOGLOBIN: 14.1 g/dL (ref 12.0–15.0)
MCH: 31.1 pg (ref 26.0–34.0)
MCHC: 34.8 g/dL (ref 30.0–36.0)
MCV: 89.2 fL (ref 78.0–100.0)
Platelets: 241 10*3/uL (ref 150–400)
RBC: 4.54 MIL/uL (ref 3.87–5.11)
RDW: 13.1 % (ref 11.5–15.5)
WBC: 12.9 10*3/uL — AB (ref 4.0–10.5)

## 2016-05-25 LAB — WET PREP, GENITAL
CLUE CELLS WET PREP: NONE SEEN
Sperm: NONE SEEN
TRICH WET PREP: NONE SEEN
Yeast Wet Prep HPF POC: NONE SEEN

## 2016-05-25 LAB — URINE MICROSCOPIC-ADD ON: RBC / HPF: NONE SEEN RBC/hpf (ref 0–5)

## 2016-05-25 LAB — HCG, QUANTITATIVE, PREGNANCY: HCG, BETA CHAIN, QUANT, S: 38817 m[IU]/mL — AB (ref <5)

## 2016-05-25 MED ORDER — PROMETHAZINE HCL 12.5 MG PO TABS: 12.5000 mg | ORAL_TABLET | Freq: Four times a day (QID) | ORAL | 1 refills | Status: DC | PRN

## 2016-05-25 MED ORDER — FAMOTIDINE IN NACL 20-0.9 MG/50ML-% IV SOLN
20.0000 mg | Freq: Once | INTRAVENOUS | Status: AC
  Administered 2016-05-25: 20 mg via INTRAVENOUS
  Filled 2016-05-25: qty 50

## 2016-05-25 MED ORDER — PROMETHAZINE HCL 25 MG RE SUPP: 25.0000 mg | Freq: Four times a day (QID) | RECTAL | 1 refills | Status: DC | PRN

## 2016-05-25 MED ORDER — LACTATED RINGERS IV BOLUS (SEPSIS)
1000.0000 mL | Freq: Once | INTRAVENOUS | Status: AC
  Administered 2016-05-25: 1000 mL via INTRAVENOUS

## 2016-05-25 MED ORDER — RANITIDINE HCL 150 MG PO TABS: 150.0000 mg | ORAL_TABLET | Freq: Every day | ORAL | 1 refills | Status: DC

## 2016-05-25 MED ORDER — PROMETHAZINE HCL 25 MG/ML IJ SOLN
12.5000 mg | Freq: Once | INTRAMUSCULAR | Status: AC
Start: 2016-05-25 — End: 2016-05-25
  Administered 2016-05-25: 12.5 mg via INTRAVENOUS
  Filled 2016-05-25: qty 1

## 2016-05-25 MED ORDER — DEXTROSE IN LACTATED RINGERS 5 % IV SOLN
Freq: Once | INTRAVENOUS | Status: AC
  Administered 2016-05-25: 15:00:00 via INTRAVENOUS
  Filled 2016-05-25: qty 10

## 2016-05-25 NOTE — Discharge Instructions (Signed)
Hyperemesis Gravidarum  Hyperemesis gravidarum is a severe form of nausea and vomiting that happens during pregnancy. Hyperemesis is worse than morning sickness. It may cause you to have nausea or vomiting all day for many days. It may keep you from eating and drinking enough food and liquids. Hyperemesis usually occurs during the first half (the first 20 weeks) of pregnancy. It often goes away once a woman is in her second half of pregnancy. However, sometimes hyperemesis continues through an entire pregnancy.   CAUSES   The cause of this condition is not completely known but is thought to be related to changes in the body's hormones when pregnant. It could be from the high level of the pregnancy hormone or an increase in estrogen in the body.   SIGNS AND SYMPTOMS    Severe nausea and vomiting.   Nausea that does not go away.   Vomiting that does not allow you to keep any food down.   Weight loss and body fluid loss (dehydration).   Having no desire to eat or not liking food you have previously enjoyed.  DIAGNOSIS   Your health care provider will do a physical exam and ask you about your symptoms. He or she may also order blood tests and urine tests to make sure something else is not causing the problem.   TREATMENT   You may only need medicine to control the problem. If medicines do not control the nausea and vomiting, you will be treated in the hospital to prevent dehydration, increased acid in the blood (acidosis), weight loss, and changes in the electrolytes in your body that may harm the unborn baby (fetus). You may need IV fluids.   HOME CARE INSTRUCTIONS    Only take over-the-counter or prescription medicines as directed by your health care provider.   Try eating a couple of dry crackers or toast in the morning before getting out of bed.   Avoid foods and smells that upset your stomach.   Avoid fatty and spicy foods.   Eat 5-6 small meals a day.   Do not drink when eating meals. Drink between  meals.   For snacks, eat high-protein foods, such as cheese.   Eat or suck on things that have ginger in them. Ginger helps nausea.   Avoid food preparation. The smell of food can spoil your appetite.   Avoid iron pills and iron in your multivitamins until after 3-4 months of being pregnant. However, consult with your health care provider before stopping any prescribed iron pills.  SEEK MEDICAL CARE IF:    Your abdominal pain increases.   You have a severe headache.   You have vision problems.   You are losing weight.  SEEK IMMEDIATE MEDICAL CARE IF:    You are unable to keep fluids down.   You vomit blood.   You have constant nausea and vomiting.   You have excessive weakness.   You have extreme thirst.   You have dizziness or fainting.   You have a fever or persistent symptoms for more than 2-3 days.   You have a fever and your symptoms suddenly get worse.  MAKE SURE YOU:    Understand these instructions.   Will watch your condition.   Will get help right away if you are not doing well or get worse.     This information is not intended to replace advice given to you by your health care provider. Make sure you discuss any questions you have with   your health care provider.     Document Released: 09/25/2005 Document Revised: 07/16/2013 Document Reviewed: 05/07/2013  [employer]vier Interactive Patient Education 2016 [employer]

## 2016-05-25 NOTE — MAU Provider Note (Signed)
History     CSN: [id number]  Arrival date and time: 05/25/16 1059   None     Chief Complaint  Patient presents with  . Emesis During Pregnancy   Marie Holt is a 24yo [id number] presenting with 24 hours of nausea and constant vomiting. She has been unable to take both solids and liquids without vomiting. She was seen 2 weeks ago with worsening abdominal pain and had a quantitative hCG of 769 but no findings on ultrasound and did not present for followup hCGs.       OB History    Gravida Para Term Preterm AB Living   3 2 2     2    SAB TAB Ectopic Multiple Live Births           2      Past Medical History:  Diagnosis Date  . Anxiety   . Asthma   . 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 Right ureteral stone   . Yeast infection     Past Surgical History:  Procedure Laterality Date  . CYSTOSCOPY W/ RETROGRADES  06/03/2012   Procedure: CYSTOSCOPY WITH RETROGRADE PYELOGRAM;  Surgeon: Valetta Fuller, MD;  Location: [facility];  Service: Urology;  Laterality: Right;    Family History  Problem Relation Age of Onset  . Hypertension Mother   . Cancer Father     lung  . Cancer Paternal Aunt     breast  . Von Willebrand disease Sister   . Hypertension Maternal Grandmother   . Cancer Paternal Grandmother     unknown origin  . Cancer Paternal Grandfather     unknown origin  . Anesthesia problems Neg Hx     Social History  Substance Use Topics  . Smoking status: Never Smoker  . Smokeless tobacco: Never Used  . Alcohol use No     Comment: occasional    Allergies:  Allergies  Allergen Reactions  . Vicodin [Hydrocodone-Acetaminophen] Nausea And Vomiting    No prescriptions prior to admission.    Review of Systems  Constitutional: Negative for fever.  Gastrointestinal: Positive for abdominal pain, constipation, diarrhea, nausea and vomiting.  Genitourinary: Negative.   Neurological: Positive for weakness.    Physical Exam   Blood pressure 105/70, pulse 92, temperature 97.4 F (36.3 C), temperature source Oral, resp. rate 18, height 5' 2.5" (1.588 m), weight 54.4 kg (120 lb), last menstrual period 02/09/2016, unknown if currently breastfeeding.  Physical Exam  Constitutional: She is oriented to person, place, and time. She appears well-developed and well-nourished.  HENT:  Head: Normocephalic and atraumatic.  Neck: Normal range of motion.  Cardiovascular: Normal rate.   Respiratory: Effort normal.  GI: Soft. She exhibits no distension and no mass. There is tenderness (diffuse, all quadrants). There is no rebound and no guarding.  Genitourinary:  Genitourinary Comments: External: no lesions Vagina: rugated, parous, moderate yellow discharge from cervix Uterus: non enlarged, anteverted, non tender, no CMT Adnexae: no masses, no tenderness left, no tenderness right   Musculoskeletal: Normal range of motion.  Neurological: She is alert and oriented to person, place, and time.  Skin: Skin is warm and dry.  Psychiatric: She has a normal mood and affect.   Results for orders placed or performed during the hospital encounter of 05/25/16 (from the past 24 hour(s))  Urinalysis, Routine w reflex microscopic (not at [facility])     Status: Abnormal   Collection Time: 05/25/16 11:19 AM  Result Value Ref Range   Color, Urine YELLOW YELLOW   APPearance HAZY (A) CLEAR   Specific Gravity, Urine 1.025 1.005 - 1.030   pH 6.5 5.0 - 8.0   Glucose, UA NEGATIVE NEGATIVE mg/dL   Hgb urine dipstick TRACE (A) NEGATIVE   Bilirubin Urine NEGATIVE NEGATIVE   Ketones, ur 40 (A) NEGATIVE mg/dL   Protein, ur 30 (A) NEGATIVE mg/dL   Nitrite NEGATIVE NEGATIVE   Leukocytes, UA SMALL (A) NEGATIVE  Urine microscopic-add on     Status: Abnormal   Collection Time: 05/25/16 11:19 AM  Result Value Ref Range   Squamous Epithelial / LPF 0-5 (A) NONE SEEN   WBC, UA 6-30 0 - 5 WBC/hpf   RBC / HPF NONE SEEN 0 - 5 RBC/hpf    Bacteria, UA FEW (A) NONE SEEN  CBC     Status: Abnormal   Collection Time: 05/25/16 11:54 AM  Result Value Ref Range   WBC 12.9 (H) 4.0 - 10.5 K/uL   RBC 4.54 3.87 - 5.11 MIL/uL   Hemoglobin 14.1 12.0 - 15.0 g/dL   HCT 62.940.5 52.836.0 - 41.346.0 %   MCV 89.2 78.0 - 100.0 fL   MCH 31.1 26.0 - 34.0 pg   MCHC 34.8 30.0 - 36.0 g/dL   RDW 24.413.1 01.011.5 - 27.215.5 %   Platelets 241 150 - 400 K/uL  Comprehensive metabolic panel     Status: Abnormal   Collection Time: 05/25/16 11:54 AM  Result Value Ref Range   Sodium 135 135 - 145 mmol/L   Potassium 3.7 3.5 - 5.1 mmol/L   Chloride 105 101 - 111 mmol/L   CO2 21 (L) 22 - 32 mmol/L   Glucose, Bld 108 (H) 65 - 99 mg/dL   BUN 8 6 - 20 mg/dL   Creatinine, Ser 5.360.71 0.44 - 1.00 mg/dL   Calcium 9.9 8.9 - 64.410.3 mg/dL   Total Protein 7.9 6.5 - 8.1 g/dL   Albumin 4.5 3.5 - 5.0 g/dL   AST 13 (L) 15 - 41 U/L   ALT 10 (L) 14 - 54 U/L   Alkaline Phosphatase 52 38 - 126 U/L   Total Bilirubin 1.3 (H) 0.3 - 1.2 mg/dL   GFR calc non Af Amer >60 >60 mL/min   GFR calc Af Amer >60 >60 mL/min   Anion gap 9 5 - 15  hCG, quantitative, pregnancy     Status: Abnormal   Collection Time: 05/25/16 11:54 AM  Result Value Ref Range   hCG, Beta Chain, Quant, S 38,817 (H) <5 mIU/mL  Wet prep, genital     Status: Abnormal   Collection Time: 05/25/16 12:20 PM  Result Value Ref Range   Yeast Wet Prep HPF POC NONE SEEN NONE SEEN   Trich, Wet Prep NONE SEEN NONE SEEN   Clue Cells Wet Prep HPF POC NONE SEEN NONE SEEN   WBC, Wet Prep HPF POC MANY (A) NONE SEEN   Sperm NONE SEEN    [place] Ob Transvaginal  Result Date: 05/25/2016 CLINICAL DATA:  Excessive vomiting since yesterday morning, pain, early pregnancy. EXAM: TRANSVAGINAL OB ULTRASOUND TECHNIQUE: Transvaginal ultrasound was performed for complete evaluation of the gestation as well as the maternal uterus, adnexal regions, and pelvic cul-de-sac. COMPARISON:  None. FINDINGS: Intrauterine gestational sac: Single intrauterine  gestational sac with appropriate surrounding decidual reaction. Yolk sac:  Normal Embryo:  Present Cardiac Activity: Present Heart Rate: 142 bpm MSD:   mm    w     d  CRL:   9  mm   6 w 5 d                  US EDC: 01/13/2017 Subchorionic hemorrhage: Questionable small subchorionic bleed, but not convincing. Maternal uterus/adnexae: Maternal ovaries are unremarkable with expected corpus luteum in the right ovary. No mass or Holt fluid identified within either adnexal region. No mass or Holt fluid seen in the cul-de-sac. IMPRESSION: 1. Single live intrauterine pregnancy with estimated gestational age of [redacted] weeks and 5 days. Fetal heart rate of 142 beats per minute. 2. No abnormality identified. Questionable small subchorionic bleed, but not convincing. Electronically Signed   By: Bary RichardStan  Maynard M.D.   On: 05/25/2016 14:42   MAU Course  Procedures IV LR 1L bolus IV MTV IV Phenergan 12.5 mg IV Pepcid 20 mg Po challenge  MDM Labs and US ordered and reviewed. No episodes of emesis after antiemetic. Feeling better. Tolerating po. Stable for discharge home.  Assessment and Plan   1. Nausea and vomiting during pregnancy prior to [redacted] weeks gestation   2. Pregnancy of unknown anatomic location   3. Abdominal pain   4. Dehydration   5. Cramping affecting pregnancy, antepartum    Discharge home Phenergan 12.5-25mg  po/pr #30, refill x1 Zantac 150 mg po daily #30, refill x1 Follow up in 2 weeks with OB provider of choice Return for worsening sx Pregnancy confirm letter provided  Marie Holt, CNM 05/25/2016, 12:36 PM

## 2016-05-25 NOTE — MAU Note (Signed)
Pt started vomiting yesterday morning, hasn't stopped, yellow liquid.  Unable to hold down anything. Feels very weak.  Has lower abd pain that started last night, denies bleeding.  Was supposed to come to MAU earlier this month for repeat quant, was unable to come.

## 2016-05-26 LAB — CULTURE, OB URINE: CULTURE: NO GROWTH

## 2016-05-26 LAB — GC/CHLAMYDIA PROBE AMP (~~LOC~~) NOT AT ARMC
Chlamydia: POSITIVE — AB
Neisseria Gonorrhea: NEGATIVE

## 2016-05-30 ENCOUNTER — Inpatient Hospital Stay (HOSPITAL_COMMUNITY)
Admission: AD | Admit: 2016-05-30 | Discharge: 2016-05-30 | Disposition: A | Discharge status: Home / Self Care | Payer: Medicaid Other | Source: Ambulatory Visit | Attending: Family Medicine | Admitting: Family Medicine

## 2016-05-30 ENCOUNTER — Encounter (HOSPITAL_COMMUNITY): Payer: Self-pay

## 2016-05-30 ENCOUNTER — Encounter (HOSPITAL_COMMUNITY): Payer: Self-pay | Admitting: *Deleted

## 2016-05-30 DIAGNOSIS — E860 Dehydration: Secondary | ICD-10-CM | POA: Diagnosis not present

## 2016-05-30 DIAGNOSIS — Z3A01 Less than 8 weeks gestation of pregnancy: Secondary | ICD-10-CM | POA: Diagnosis not present

## 2016-05-30 DIAGNOSIS — Z885 Allergy status to narcotic agent status: Secondary | ICD-10-CM | POA: Diagnosis not present

## 2016-05-30 DIAGNOSIS — Z87442 Personal history of urinary calculi: Secondary | ICD-10-CM | POA: Insufficient documentation

## 2016-05-30 DIAGNOSIS — O99282 Endocrine, nutritional and metabolic diseases complicating pregnancy, second trimester: Secondary | ICD-10-CM | POA: Insufficient documentation

## 2016-05-30 DIAGNOSIS — O219 Vomiting of pregnancy, unspecified: Secondary | ICD-10-CM | POA: Diagnosis not present

## 2016-05-30 DIAGNOSIS — R55 Syncope and collapse: Secondary | ICD-10-CM | POA: Insufficient documentation

## 2016-05-30 LAB — URINALYSIS, ROUTINE W REFLEX MICROSCOPIC
BILIRUBIN URINE: NEGATIVE
Glucose, UA: 500 mg/dL — AB
Leukocytes, UA: NEGATIVE
NITRITE: NEGATIVE
PROTEIN: NEGATIVE mg/dL
SPECIFIC GRAVITY, URINE: 1.02 (ref 1.005–1.030)
pH: 6 (ref 5.0–8.0)

## 2016-05-30 LAB — COMPREHENSIVE METABOLIC PANEL
ALBUMIN: 3.8 g/dL (ref 3.5–5.0)
ALK PHOS: 38 U/L (ref 38–126)
ALT: 9 U/L — AB (ref 14–54)
ANION GAP: 5 (ref 5–15)
AST: 16 U/L (ref 15–41)
BILIRUBIN TOTAL: 0.9 mg/dL (ref 0.3–1.2)
BUN: 10 mg/dL (ref 6–20)
CALCIUM: 8.5 mg/dL — AB (ref 8.9–10.3)
CO2: 22 mmol/L (ref 22–32)
CREATININE: 0.71 mg/dL (ref 0.44–1.00)
Chloride: 105 mmol/L (ref 101–111)
GFR calc Af Amer: >60 mL/min (ref >60)
GFR calc non Af Amer: >60 mL/min (ref >60)
GLUCOSE: 291 mg/dL — AB (ref 65–99)
Potassium: 3.5 mmol/L (ref 3.5–5.1)
Sodium: 132 mmol/L — ABNORMAL LOW (ref 135–145)
TOTAL PROTEIN: 6.3 g/dL — AB (ref 6.5–8.1)

## 2016-05-30 LAB — CBC
HCT: 34.1 % — ABNORMAL LOW (ref 36.0–46.0)
HEMOGLOBIN: 11.8 g/dL — AB (ref 12.0–15.0)
MCH: 30.8 pg (ref 26.0–34.0)
MCHC: 34.6 g/dL (ref 30.0–36.0)
MCV: 89 fL (ref 78.0–100.0)
PLATELETS: 226 10*3/uL (ref 150–400)
RBC: 3.83 MIL/uL — ABNORMAL LOW (ref 3.87–5.11)
RDW: 13 % (ref 11.5–15.5)
WBC: 12.5 10*3/uL — ABNORMAL HIGH (ref 4.0–10.5)

## 2016-05-30 LAB — URINE MICROSCOPIC-ADD ON

## 2016-05-30 LAB — GLUCOSE, CAPILLARY: Glucose-Capillary: 85 mg/dL (ref 65–99)

## 2016-05-30 MED ORDER — SODIUM CHLORIDE 0.9 % IV SOLN
Freq: Once | INTRAVENOUS | Status: AC
  Administered 2016-05-30: 20:00:00 via INTRAVENOUS

## 2016-05-30 MED ORDER — METOCLOPRAMIDE HCL 10 MG PO TABS: 10.0000 mg | ORAL_TABLET | Freq: Three times a day (TID) | ORAL | 1 refills | Status: DC

## 2016-05-30 MED ORDER — RANITIDINE HCL 150 MG PO TABS: 150.0000 mg | ORAL_TABLET | Freq: Two times a day (BID) | ORAL | 1 refills | Status: DC

## 2016-05-30 MED ORDER — M.V.I. ADULT IV INJ
Freq: Once | INTRAVENOUS | Status: AC
  Administered 2016-05-30: 21:00:00 via INTRAVENOUS
  Filled 2016-05-30: qty 1000

## 2016-05-30 MED ORDER — DEXTROSE IN LACTATED RINGERS 5 % IV SOLN
INTRAVENOUS | Status: DC
  Administered 2016-05-30: 20:00:00 via INTRAVENOUS

## 2016-05-30 MED ORDER — PROMETHAZINE HCL 25 MG RE SUPP: 25.0000 mg | Freq: Every evening | RECTAL | 1 refills | Status: DC | PRN

## 2016-05-30 MED ORDER — PROMETHAZINE HCL 25 MG/ML IJ SOLN
12.5000 mg | Freq: Once | INTRAMUSCULAR | Status: DC
Start: 2016-05-30 — End: 2016-05-31

## 2016-05-30 MED ORDER — PROMETHAZINE HCL 25 MG/ML IJ SOLN
12.5000 mg | Freq: Once | INTRAMUSCULAR | Status: AC
  Administered 2016-05-30: 12.5 mg via INTRAVENOUS
  Filled 2016-05-30: qty 1

## 2016-05-30 NOTE — MAU Note (Signed)
Pt presents to MAU for nausea and vomiting. Loss of consciousness in lobby. Brought to room on stretcher and responding to RN's questions. IV started.

## 2016-05-30 NOTE — MAU Provider Note (Signed)
Chief Complaint: Loss of Consciousness   First Provider Initiated Contact with Patient 05/30/16 1933        SUBJECTIVE HPI: Marie Holt is a 25 y.o. G3P2002 at 43w3dby LMP who presents to maternity admissions reporting nausea, vomiting, and syncope at the front desk while signing in.  Not sure if she hit her head or not. Secretary said she "went down slowly" Has been given Phenergan and Zantac for use at home but states it is not working.  She denies vaginal bleeding, vaginal itching/burning, urinary symptoms, h/a, dizziness, or fever/chills.    Loss of Consciousness  This is a new problem. The current episode started today. The problem occurs rarely. She lost consciousness for a period of less than 1 minute. The symptoms are aggravated by standing. Associated symptoms include dizziness, light-headedness, malaise/fatigue, nausea, vomiting and weakness. Pertinent negatives include no abdominal pain, bladder incontinence, bowel incontinence, confusion, focal weakness or headaches. She has tried drinking for the symptoms. The treatment provided no relief.   RN Note: Pt presents to MAU for nausea and vomiting. Loss of consciousness in lobby. Brought to room on stretcher and responding to RN's questions. IV started.    Past Medical History:  Diagnosis Date  . Anxiety   . Asthma   . 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 Right ureteral stone   . Yeast infection    Past Surgical History:  Procedure Laterality Date  . CYSTOSCOPY W/ RETROGRADES  06/03/2012   Procedure: CYSTOSCOPY WITH RETROGRADE PYELOGRAM;  Surgeon: DBernestine Amass MD;  [facility]  Service: Urology;  Laterality: Right;   Social History   Social History  . Marital status: Single    Spouse name: N/A  . Number of children: N/A  . Years of education: N/A   Occupational History  . Not on file.   Social History Main Topics  . Smoking status: Never Smoker  .  Smokeless tobacco: Never Used  . Alcohol use No     Comment: occasional  . Drug use: No     Comment: denies any usuage  . Sexual activity: Yes    Birth control/ protection: None   Other Topics Concern  . Not on file   Social History Narrative  . No narrative on file   No current facility-administered medications on file prior to encounter.    Current Outpatient Prescriptions on File Prior to Encounter  Medication Sig Dispense Refill  . promethazine (PHENERGAN) 12.5 MG tablet Take 1 tablet (12.5 mg total) by mouth every 6 (six) hours as needed for nausea or vomiting. 30 tablet 1  . ranitidine (ZANTAC) 150 MG tablet Take 1 tablet (150 mg total) by mouth at bedtime. 30 tablet 1  . promethazine (PHENERGAN) 25 MG suppository Place 1 suppository (25 mg total) rectally every 6 (six) hours as needed for nausea. (Patient not taking: Reported on 05/30/2016) 12 suppository 1   Allergies  Allergen Reactions  . Vicodin [Hydrocodone-Acetaminophen] Nausea And Vomiting    I have reviewed patient's Past Medical Hx, Surgical Hx, Family Hx, Social Hx, medications and allergies.   ROS:  Review of Systems  Constitutional: Positive for malaise/fatigue.  Cardiovascular: Positive for syncope.  Gastrointestinal: Positive for nausea and vomiting. Negative for abdominal pain and bowel incontinence.  Genitourinary: Negative for bladder incontinence.  Neurological: Positive for dizziness, weakness and light-headedness. Negative for focal weakness and headaches.  Psychiatric/Behavioral: Negative for confusion.   Other systems negative  Physical Exam   Patient Vitals for the past 24 hrs:  BP Temp Temp src Pulse Resp  05/30/16 2224 98/64 - - 88 -  05/30/16 1929 107/67 - - 76 18  05/30/16 1925 (!) 86/55 98.3 F (36.8 C) Oral 81 22   Physical Exam  Constitutional: Well-developed, female in no acute distress, but somnolent.  Cardiovascular: normal rate, 90s Respiratory: normal effort, clear  bilaterally GI: Abd soft, non-tender. Pos BS x 4 MS: Extremities nontender, no edema, normal ROM Neurologic: Alert and oriented x 4.  GU: Neg CVAT.    LAB RESULTS  Results for orders placed or performed during the hospital encounter of 05/30/16 (from the past 48 hour(s))  Glucose, capillary     Status: None   Collection Time: 05/30/16  7:29 PM  Result Value Ref Range   Glucose-Capillary 85 65 - 99 mg/dL  Comprehensive metabolic panel     Status: Abnormal   Collection Time: 05/30/16  9:08 PM  Result Value Ref Range   Sodium 132 (L) 135 - 145 mmol/L   Potassium 3.5 3.5 - 5.1 mmol/L   Chloride 105 101 - 111 mmol/L   CO2 22 22 - 32 mmol/L   Glucose, Bld 291 (H) 65 - 99 mg/dL   BUN 10 6 - 20 mg/dL   Creatinine, Ser 0.71 0.44 - 1.00 mg/dL   Calcium 8.5 (L) 8.9 - 10.3 mg/dL   Total Protein 6.3 (L) 6.5 - 8.1 g/dL   Albumin 3.8 3.5 - 5.0 g/dL   AST 16 15 - 41 U/L   ALT 9 (L) 14 - 54 U/L   Alkaline Phosphatase 38 38 - 126 U/L   Total Bilirubin 0.9 0.3 - 1.2 mg/dL   GFR calc non Af Amer >60 >60 mL/min   GFR calc Af Amer >60 >60 mL/min    Comment: (NOTE) The eGFR has been calculated using the CKD EPI equation. This calculation has not been validated in all clinical situations. eGFR's persistently <60 mL/min signify possible Chronic Kidney Disease.    Anion gap 5 5 - 15  CBC     Status: Abnormal   Collection Time: 05/30/16  9:08 PM  Result Value Ref Range   WBC 12.5 (H) 4.0 - 10.5 K/uL   RBC 3.83 (L) 3.87 - 5.11 MIL/uL   Hemoglobin 11.8 (L) 12.0 - 15.0 g/dL   HCT 34.1 (L) 36.0 - 46.0 %   MCV 89.0 78.0 - 100.0 fL   MCH 30.8 26.0 - 34.0 pg   MCHC 34.6 30.0 - 36.0 g/dL   RDW 13.0 11.5 - 15.5 %   Platelets 226 150 - 400 K/uL  Urinalysis, Routine w reflex microscopic (not at [facility])     Status: Abnormal   Collection Time: 05/30/16  9:25 PM  Result Value Ref Range   Color, Urine YELLOW YELLOW   APPearance CLEAR CLEAR   Specific Gravity, Urine 1.020 1.005 - 1.030   pH 6.0 5.0 -  8.0   Glucose, UA 500 (A) NEGATIVE mg/dL   Hgb urine dipstick TRACE (A) NEGATIVE   Bilirubin Urine NEGATIVE NEGATIVE   Ketones, ur >80 (A) NEGATIVE mg/dL   Protein, ur NEGATIVE NEGATIVE mg/dL   Nitrite NEGATIVE NEGATIVE   Leukocytes, UA NEGATIVE NEGATIVE  Urine microscopic-add on     Status: Abnormal   Collection Time: 05/30/16  9:25 PM  Result Value Ref Range   Squamous Epithelial / LPF 0-5 (A) NONE SEEN   WBC, UA 0-5 0 - 5 WBC/hpf  RBC / HPF 0-5 0 - 5 RBC/hpf   Bacteria, UA RARE (A) NONE SEEN    IMAGING US Ob Comp Less 14 Wks  Result Date: 05/11/2016 CLINICAL DATA:  Early pregnancy with cramping pelvic pain for 4 days. EXAM: OBSTETRIC <14 WK [place] AND TRANSVAGINAL OB US TECHNIQUE: Both transabdominal and transvaginal ultrasound examinations were performed for complete evaluation of the gestation as well as the maternal uterus, adnexal regions, and pelvic cul-de-sac. Transvaginal technique was performed to assess early pregnancy. COMPARISON:  None. FINDINGS: Intrauterine gestational sac: None Yolk sac:  N/A Embryo:  N/A Cardiac Activity: N/A Heart Rate: N/A  bpm Subchorionic hemorrhage:  None visualized. Maternal uterus/adnexae: Normal right ovary measuring 2.3 x 3.6 x 2.8 cm. Normal left ovary measuring 2.2 x 1.7 x 2.3 cm No free pelvic fluid collections. IMPRESSION: Normal sonographic appearance of the uterus and ovaries. No findings for intrauterine gestational sac or tubal pregnancy. Electronically Signed   By: Marijo Sanes M.D.   On: 05/11/2016 15:41   US Ob Transvaginal  Result Date: 05/25/2016 CLINICAL DATA:  Excessive vomiting since yesterday morning, pain, early pregnancy. EXAM: TRANSVAGINAL OB ULTRASOUND TECHNIQUE: Transvaginal ultrasound was performed for complete evaluation of the gestation as well as the maternal uterus, adnexal regions, and pelvic cul-de-sac. COMPARISON:  None. FINDINGS: Intrauterine gestational sac: Single intrauterine gestational sac with appropriate  surrounding decidual reaction. Yolk sac:  Normal Embryo:  Present Cardiac Activity: Present Heart Rate: 142 bpm MSD:   mm    w     d CRL:   9  mm   6 w 5 d                  [place] EDC: 01/13/2017 Subchorionic hemorrhage: Questionable small subchorionic bleed, but not convincing. Maternal uterus/adnexae: Maternal ovaries are unremarkable with expected corpus luteum in the right ovary. No mass or free fluid identified within either adnexal region. No mass or free fluid seen in the cul-de-sac. IMPRESSION: 1. Single live intrauterine pregnancy with estimated gestational age of [redacted] weeks and 5 days. Fetal heart rate of 142 beats per minute. 2. No abnormality identified. Questionable small subchorionic bleed, but not convincing. Electronically Signed   By: Franki Cabot M.D.   On: 05/25/2016 14:42   US Ob Transvaginal  Result Date: 05/11/2016 CLINICAL DATA:  Early pregnancy with cramping pelvic pain for 4 days. EXAM: OBSTETRIC <14 WK [place] AND TRANSVAGINAL OB US TECHNIQUE: Both transabdominal and transvaginal ultrasound examinations were performed for complete evaluation of the gestation as well as the maternal uterus, adnexal regions, and pelvic cul-de-sac. Transvaginal technique was performed to assess early pregnancy. COMPARISON:  None. FINDINGS: Intrauterine gestational sac: None Yolk sac:  N/A Embryo:  N/A Cardiac Activity: N/A Heart Rate: N/A  bpm Subchorionic hemorrhage:  None visualized. Maternal uterus/adnexae: Normal right ovary measuring 2.3 x 3.6 x 2.8 cm. Normal left ovary measuring 2.2 x 1.7 x 2.3 cm No free pelvic fluid collections. IMPRESSION: Normal sonographic appearance of the uterus and ovaries. No findings for intrauterine gestational sac or tubal pregnancy. Electronically Signed   By: Marijo Sanes M.D.   On: 05/11/2016 15:41    MAU Management/MDM: Placed in bed, IV started Warm blankets Alert and oriented, moves extremities well bilaterally, Consulted Dr Kennon Rounds > observe and if neurologic function  deteriorates, will get head CT. WIll bolus IV fluids Will give Phenergan IV now.  May need to add Zofran or steroids to home regime. MVI bag X1 Glucose 291> this was from a CMP  taken after 1 liter of D5LR  Patient tolerating PO fluids.   ASSESSMENT SIUP at [redacted]w[redacted]d Hyperemesis Dehydration Syncope secondary to dehydration   PLAN Report given to oncoming NP  MHansel FeinsteinCNM, MSN Certified Nurse-Midwife 05/30/2016  7:53 PM  Resumed care of the patient at 1900 Patient up to the bathroom at this time.   Plan:   Discharge home in stable condition Patient with out HA  Rx: Phenergan at bedtime        Reglan TID mealtime Continue zantac Discussed the importance of taking medication as prescribed Small, frequent meals   JLezlie Lye NP 05/30/2016 10:38 PM

## 2016-05-30 NOTE — Discharge Instructions (Signed)
Hyperemesis Gravidarum  Hyperemesis gravidarum is a severe form of nausea and vomiting that happens during pregnancy. Hyperemesis is worse than morning sickness. It may cause you to have nausea or vomiting all day for many days. It may keep you from eating and drinking enough food and liquids. Hyperemesis usually occurs during the first half (the first 20 weeks) of pregnancy. It often goes away once a woman is in her second half of pregnancy. However, sometimes hyperemesis continues through an entire pregnancy.   CAUSES   The cause of this condition is not completely known but is thought to be related to changes in the body's hormones when pregnant. It could be from the high level of the pregnancy hormone or an increase in estrogen in the body.   SIGNS AND SYMPTOMS    Severe nausea and vomiting.   Nausea that does not go away.   Vomiting that does not allow you to keep any food down.   Weight loss and body fluid loss (dehydration).   Having no desire to eat or not liking food you have previously enjoyed.  DIAGNOSIS   Your health care provider will do a physical exam and ask you about your symptoms. He or she may also order blood tests and urine tests to make sure something else is not causing the problem.   TREATMENT   You may only need medicine to control the problem. If medicines do not control the nausea and vomiting, you will be treated in the hospital to prevent dehydration, increased acid in the blood (acidosis), weight loss, and changes in the electrolytes in your body that may harm the unborn baby (fetus). You may need IV fluids.   HOME CARE INSTRUCTIONS    Only take over-the-counter or prescription medicines as directed by your health care provider.   Try eating a couple of dry crackers or toast in the morning before getting out of bed.   Avoid foods and smells that upset your stomach.   Avoid fatty and spicy foods.   Eat 5-6 small meals a day.   Do not drink when eating meals. Drink between  meals.   For snacks, eat high-protein foods, such as cheese.   Eat or suck on things that have ginger in them. Ginger helps nausea.   Avoid food preparation. The smell of food can spoil your appetite.   Avoid iron pills and iron in your multivitamins until after 3-4 months of being pregnant. However, consult with your health care provider before stopping any prescribed iron pills.  SEEK MEDICAL CARE IF:    Your abdominal pain increases.   You have a severe headache.   You have vision problems.   You are losing weight.  SEEK IMMEDIATE MEDICAL CARE IF:    You are unable to keep fluids down.   You vomit blood.   You have constant nausea and vomiting.   You have excessive weakness.   You have extreme thirst.   You have dizziness or fainting.   You have a fever or persistent symptoms for more than 2-3 days.   You have a fever and your symptoms suddenly get worse.  MAKE SURE YOU:    Understand these instructions.   Will watch your condition.   Will get help right away if you are not doing well or get worse.     This information is not intended to replace advice given to you by your health care provider. Make sure you discuss any questions you have with   your health care provider.     Document Released: 09/25/2005 Document Revised: 07/16/2013 Document Reviewed: 05/07/2013  [employer]vier Interactive Patient Education 2016 [employer]

## 2016-06-05 ENCOUNTER — Inpatient Hospital Stay (HOSPITAL_COMMUNITY)
Admission: AD | Admit: 2016-06-05 | Discharge: 2016-06-05 | Disposition: A | Discharge status: Home / Self Care | Payer: Medicaid Other | Source: Ambulatory Visit | Attending: Family Medicine | Admitting: Family Medicine

## 2016-06-05 ENCOUNTER — Encounter (HOSPITAL_COMMUNITY): Payer: Self-pay | Admitting: *Deleted

## 2016-06-05 DIAGNOSIS — Z3A08 8 weeks gestation of pregnancy: Secondary | ICD-10-CM | POA: Insufficient documentation

## 2016-06-05 DIAGNOSIS — O219 Vomiting of pregnancy, unspecified: Secondary | ICD-10-CM

## 2016-06-05 DIAGNOSIS — E860 Dehydration: Secondary | ICD-10-CM

## 2016-06-05 DIAGNOSIS — O211 Hyperemesis gravidarum with metabolic disturbance: Secondary | ICD-10-CM | POA: Insufficient documentation

## 2016-06-05 LAB — COMPREHENSIVE METABOLIC PANEL
ALT: 13 U/L — AB (ref 14–54)
ANION GAP: 9 (ref 5–15)
AST: 16 U/L (ref 15–41)
Albumin: 4.7 g/dL (ref 3.5–5.0)
Alkaline Phosphatase: 55 U/L (ref 38–126)
BUN: 13 mg/dL (ref 6–20)
CHLORIDE: 102 mmol/L (ref 101–111)
CO2: 24 mmol/L (ref 22–32)
CREATININE: 0.76 mg/dL (ref 0.44–1.00)
Calcium: 10 mg/dL (ref 8.9–10.3)
Glucose, Bld: 123 mg/dL — ABNORMAL HIGH (ref 65–99)
POTASSIUM: 3.7 mmol/L (ref 3.5–5.1)
SODIUM: 135 mmol/L (ref 135–145)
Total Bilirubin: 0.8 mg/dL (ref 0.3–1.2)
Total Protein: 8.3 g/dL — ABNORMAL HIGH (ref 6.5–8.1)

## 2016-06-05 LAB — URINALYSIS, ROUTINE W REFLEX MICROSCOPIC
Bilirubin Urine: NEGATIVE
GLUCOSE, UA: NEGATIVE mg/dL
KETONES UR: 40 mg/dL — AB
LEUKOCYTES UA: NEGATIVE
Nitrite: NEGATIVE
PH: 7.5 (ref 5.0–8.0)
Protein, ur: 100 mg/dL — AB
Specific Gravity, Urine: 1.02 (ref 1.005–1.030)

## 2016-06-05 LAB — CBC
HCT: 41 % (ref 36.0–46.0)
Hemoglobin: 14.2 g/dL (ref 12.0–15.0)
MCH: 30.9 pg (ref 26.0–34.0)
MCHC: 34.6 g/dL (ref 30.0–36.0)
MCV: 89.3 fL (ref 78.0–100.0)
PLATELETS: 273 10*3/uL (ref 150–400)
RBC: 4.59 MIL/uL (ref 3.87–5.11)
RDW: 13.4 % (ref 11.5–15.5)
WBC: 16.1 10*3/uL — ABNORMAL HIGH (ref 4.0–10.5)

## 2016-06-05 LAB — URINE MICROSCOPIC-ADD ON: RBC / HPF: NONE SEEN RBC/hpf (ref 0–5)

## 2016-06-05 MED ORDER — PROMETHAZINE HCL 25 MG/ML IJ SOLN
25.0000 mg | Freq: Once | INTRAVENOUS | Status: AC
  Administered 2016-06-05: 25 mg via INTRAVENOUS
  Filled 2016-06-05: qty 1

## 2016-06-05 NOTE — MAU Note (Addendum)
States she was doing better, but has started vomiting again.  Brought from lobby via W/C to room 4. States she feels like she is going to pass out. Vomiting/spitting small amount green bile.

## 2016-06-05 NOTE — MAU Provider Note (Signed)
History     CSN: [id number]  Arrival date and time: 06/05/16 1215   First Provider Initiated Contact with Patient 06/05/16 1234      Chief Complaint  Patient presents with  . Emesis During Pregnancy   HPI  Marie Holt is a 25 y.o. G3P2002 at 565w2d who presents with nausea/vomiting. Symptoms were being managed with phenergan, reglan, & zantac until yesterday. Reports countless episodes of vomiting since yesterday morning. Attempted to take her medications this morning but immediately vomited. Unable to keep down food or fluids today.  Denies heartburn, abdominal pain, diarrhea, constipation, vaginal bleeding, dysuria, or fever.    OB History    Gravida Para Term Preterm AB Living   3 2 2     2    SAB TAB Ectopic Multiple Live Births           2      Past Medical History:  Diagnosis Date  . Anxiety   . Asthma   . 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 Right ureteral stone   . Yeast infection     Past Surgical History:  Procedure Laterality Date  . CYSTOSCOPY W/ RETROGRADES  06/03/2012   Procedure: CYSTOSCOPY WITH RETROGRADE PYELOGRAM;  Surgeon: Valetta Fulleravid S Grapey, MD;  Location[facility] [place];  Service: Urology;  Laterality: Right;    Family History  Problem Relation Age of Onset  . Hypertension Mother   . Cancer Father     lung  . Cancer Paternal Aunt     breast  . Von Willebrand disease Sister   . Hypertension Maternal Grandmother   . Cancer Paternal Grandmother     unknown origin  . Cancer Paternal Grandfather     unknown origin  . Anesthesia problems Neg Hx     Social History  Substance Use Topics  . Smoking status: Never Smoker  . Smokeless tobacco: Never Used  . Alcohol use No     Comment: occasional    Allergies:  Allergies  Allergen Reactions  . Vicodin [Hydrocodone-Acetaminophen] Nausea And Vomiting    Prescriptions Prior to Admission  Medication Sig Dispense Refill Last Dose  . metoCLOPramide  (REGLAN) 10 MG tablet Take 1 tablet (10 mg total) by mouth 3 (three) times daily before meals. 60 tablet 1   . promethazine (PHENERGAN) 25 MG suppository Place 1 suppository (25 mg total) rectally at bedtime as needed for nausea. 12 suppository 1   . ranitidine (ZANTAC) 150 MG tablet Take 1 tablet (150 mg total) by mouth 2 (two) times daily. 60 tablet 1     Review of Systems  Constitutional: Negative for chills and fever.  Gastrointestinal: Positive for nausea and vomiting. Negative for abdominal pain, constipation, diarrhea and heartburn.  Genitourinary: Negative.   Neurological: Positive for weakness. Negative for dizziness and loss of consciousness.   Physical Exam   Blood pressure 113/68, pulse 92, temperature 98.6 F (37 C), temperature source Oral, resp. rate 16, weight 115 lb 3.2 oz (52.3 kg), last menstrual period 02/09/2016, unknown if currently breastfeeding.  Physical Exam  Nursing note and vitals reviewed. Constitutional: She is oriented to person, place, and time. She appears well-developed and well-nourished. No distress.  HENT:  Head: Normocephalic and atraumatic.  Mouth/Throat: Mucous membranes are dry.  Eyes: Conjunctivae are normal. Right eye exhibits no discharge. Left eye exhibits no discharge. No scleral icterus.  Neck: Normal range of motion.  Cardiovascular: Normal rate, regular rhythm and normal heart  sounds.   No murmur heard. Respiratory: Effort normal and breath sounds normal. No respiratory distress. She has no wheezes.  GI: Soft. Bowel sounds are decreased. There is no tenderness.  Neurological: She is alert and oriented to person, place, and time.  Skin: Skin is warm and dry. She is not diaphoretic.  Psychiatric: She has a normal mood and affect. Her behavior is normal. Judgment and thought content normal.    MAU Course  Procedures Results for orders placed or performed during the hospital encounter of 06/05/16 (from the past 24 hour(s))  CBC      Status: Abnormal   Collection Time: 06/05/16 12:51 PM  Result Value Ref Range   WBC 16.1 (H) 4.0 - 10.5 K/uL   RBC 4.59 3.87 - 5.11 MIL/uL   Hemoglobin 14.2 12.0 - 15.0 g/dL   HCT 16.1 09.6 - 04.5 %   MCV 89.3 78.0 - 100.0 fL   MCH 30.9 26.0 - 34.0 pg   MCHC 34.6 30.0 - 36.0 g/dL   RDW 40.9 81.1 - 91.4 %   Platelets 273 150 - 400 K/uL  Comprehensive metabolic panel     Status: Abnormal   Collection Time: 06/05/16 12:51 PM  Result Value Ref Range   Sodium 135 135 - 145 mmol/L   Potassium 3.7 3.5 - 5.1 mmol/L   Chloride 102 101 - 111 mmol/L   CO2 24 22 - 32 mmol/L   Glucose, Bld 123 (H) 65 - 99 mg/dL   BUN 13 6 - 20 mg/dL   Creatinine, Ser 7.82 0.44 - 1.00 mg/dL   Calcium 95.6 8.9 - 21.3 mg/dL   Total Protein 8.3 (H) 6.5 - 8.1 g/dL   Albumin 4.7 3.5 - 5.0 g/dL   AST 16 15 - 41 U/L   ALT 13 (L) 14 - 54 U/L   Alkaline Phosphatase 55 38 - 126 U/L   Total Bilirubin 0.8 0.3 - 1.2 mg/dL   GFR calc non Af Amer >60 >60 mL/min   GFR calc Af Amer >60 >60 mL/min   Anion gap 9 5 - 15  Urinalysis, Routine w reflex microscopic (not at [facility])     Status: Abnormal   Collection Time: 06/05/16  1:15 PM  Result Value Ref Range   Color, Urine YELLOW YELLOW   APPearance CLEAR CLEAR   Specific Gravity, Urine 1.020 1.005 - 1.030   pH 7.5 5.0 - 8.0   Glucose, UA NEGATIVE NEGATIVE mg/dL   Hgb urine dipstick TRACE (A) NEGATIVE   Bilirubin Urine NEGATIVE NEGATIVE   Ketones, ur 40 (A) NEGATIVE mg/dL   Protein, ur 086 (A) NEGATIVE mg/dL   Nitrite NEGATIVE NEGATIVE   Leukocytes, UA NEGATIVE NEGATIVE  Urine microscopic-add on     Status: Abnormal   Collection Time: 06/05/16  1:15 PM  Result Value Ref Range   Squamous Epithelial / LPF 0-5 (A) NONE SEEN   WBC, UA 0-5 0 - 5 WBC/hpf   RBC / HPF NONE SEEN 0 - 5 RBC/hpf   Bacteria, UA RARE (A) NONE SEEN   Urine-Other MUCOUS PRESENT     MDM VSS CBC, CMP, u/a Phenergan 25 mg in bag of D5LR Pt able to tolerate po fluids & not observed vomiting since  IV started.  Pt able to ambulate to bathroom Assessment and Plan  A:  1. Nausea and vomiting during pregnancy prior to [redacted] weeks gestation   2. Dehydration     P: Discharge home Continue meds as previously prescribed Discussed using  phenergan PR or PV if not tolerated PO HEG diet info given Start prenatal care Discussed reasons to return to MAU  Judeth Horn 06/05/2016, 12:33 PM

## 2016-06-05 NOTE — MAU Note (Signed)
Not sure if she can void. Did not attempt at this time due to pt. C/O feeling like passing out.

## 2016-06-05 NOTE — Discharge Instructions (Signed)
°Eating Plan for Hyperemesis Gravidarum °Severe cases of hyperemesis gravidarum can lead to dehydration and malnutrition. The hyperemesis eating plan is one way to lessen the symptoms of nausea and vomiting. It is often used with prescribed medicines to control your symptoms.  °WHAT CAN I DO TO RELIEVE MY SYMPTOMS? °Listen to your body. Everyone is different and has different preferences. Find what works best for you. Some of the following things may help: °· Eat and drink slowly. °· Eat 5-6 small meals daily instead of 3 large meals.   °· Eat crackers before you get out of bed in the morning.   °· Starchy foods are usually well tolerated (such as cereal, toast, bread, potatoes, pasta, rice, and pretzels).   °· Ginger may help with nausea. Add ¼ tsp ground ginger to hot tea or choose ginger tea.   °· Try drinking 100% fruit juice or an electrolyte drink. °· Continue to take your prenatal vitamins as directed by your health care provider. If you are having trouble taking your prenatal vitamins, talk with your health care provider about different options. °· Include at least 1 serving of protein with your meals and snacks (such as meats or poultry, beans, nuts, eggs, or yogurt). Try eating a protein-rich snack before bed (such as cheese and crackers or a half turkey or peanut butter sandwich). °WHAT THINGS SHOULD I AVOID TO REDUCE MY SYMPTOMS? °The following things may help reduce your symptoms: °· Avoid foods with strong smells. Try eating meals in well-ventilated areas that are free of odors. °· Avoid drinking water or other beverages with meals. Try not to drink anything less than 30 minutes before and after meals. °· Avoid drinking more than 1 cup of fluid at a time. °· Avoid fried or high-fat foods, such as butter and cream sauces. °· Avoid spicy foods. °· Avoid skipping meals the best you can. Nausea can be more intense on an empty stomach. If you cannot tolerate food at that time, do not force it. Try sucking  on ice chips or other frozen items and make up the calories later. °· Avoid lying down within 2 hours after eating. °  °This information is not intended to replace advice given to you by your health care provider. Make sure you discuss any questions you have with your health care provider. °  °Document Released: 07/23/2007 Document Revised: 09/30/2013 Document Reviewed: 07/30/2013 °Elsevier Interactive Patient Education ©2016 Elsevier Inc. °Morning Sickness °Morning sickness is when you feel sick to your stomach (nauseous) during pregnancy. This nauseous feeling may or may not come with vomiting. It often occurs in the morning but can be a problem any time of day. Morning sickness is most common during the first trimester, but it may continue throughout pregnancy. While morning sickness is unpleasant, it is usually harmless unless you develop severe and continual vomiting (hyperemesis gravidarum). This condition requires more intense treatment.  °CAUSES  °The cause of morning sickness is not completely known but seems to be related to normal hormonal changes that occur in pregnancy. °RISK FACTORS °You are at greater risk if you: °· Experienced nausea or vomiting before your pregnancy. °· Had morning sickness during a previous pregnancy. °· Are pregnant with more than one baby, such as twins. °TREATMENT  °Do not use any medicines (prescription, over-the-counter, or herbal) for morning sickness without first talking to your health care provider. Your health care provider may prescribe or recommend: °· Vitamin B6 supplements. °· Anti-nausea medicines. °· The herbal medicine ginger. °HOME CARE INSTRUCTIONS  °·   Only take over-the-counter or prescription medicines as directed by your health care provider. °· Taking multivitamins before getting pregnant can prevent or decrease the severity of morning sickness in most women. °· Eat a piece of dry toast or unsalted crackers before getting out of bed in the morning. °· Eat  five or six small meals a day. °· Eat dry and bland foods (rice, baked potato). Foods high in carbohydrates are often helpful. °· Do not drink liquids with your meals. Drink liquids between meals. °· Avoid greasy, fatty, and spicy foods. °· Get someone to cook for you if the smell of any food causes nausea and vomiting. °· If you feel nauseous after taking prenatal vitamins, take the vitamins at night or with a snack.  °· Snack on protein foods (nuts, yogurt, cheese) between meals if you are hungry. °· Eat unsweetened gelatins for desserts. °· Wearing an acupressure wristband (worn for sea sickness) may be helpful. °· Acupuncture may be helpful. °· Do not smoke. °· Get a humidifier to keep the air in your house free of odors. °· Get plenty of fresh air. °SEEK MEDICAL CARE IF:  °· Your home remedies are not working, and you need medicine. °· You feel dizzy or lightheaded. °· You are losing weight. °SEEK IMMEDIATE MEDICAL CARE IF:  °· You have persistent and uncontrolled nausea and vomiting. °· You pass out (faint). °MAKE SURE YOU: °· Understand these instructions. °· Will watch your condition. °· Will get help right away if you are not doing well or get worse. °  °This information is not intended to replace advice given to you by your health care provider. Make sure you discuss any questions you have with your health care provider. °  °Document Released: 11/16/2006 Document Revised: 09/30/2013 Document Reviewed: 03/12/2013 °Elsevier Interactive Patient Education ©2016 Elsevier Inc. ° °

## 2016-06-08 ENCOUNTER — Telehealth: Payer: Self-pay | Admitting: *Deleted

## 2016-06-08 ENCOUNTER — Telehealth (HOSPITAL_COMMUNITY): Payer: Self-pay | Admitting: Family Medicine

## 2016-06-08 DIAGNOSIS — A749 Chlamydial infection, unspecified: Secondary | ICD-10-CM

## 2016-06-08 MED ORDER — AZITHROMYCIN 500 MG PO TABS: ORAL_TABLET | ORAL | 0 refills | Status: DC

## 2016-06-08 NOTE — Telephone Encounter (Signed)
Several attempts to reach patient regarding positive chlamydia culture.  Never reached patient, but Gearldine [facility], she was able to reach patient and notify.  Rx routed to pharmacy per protocol.

## 2016-06-08 NOTE — Telephone Encounter (Signed)
Azithromycin 1000 mg by mouth x 1 called to [facility] [address] [phone].

## 2016-06-09 ENCOUNTER — Inpatient Hospital Stay (HOSPITAL_COMMUNITY)
Admission: AD | Admit: 2016-06-09 | Discharge: 2016-06-09 | Disposition: A | Discharge status: Home / Self Care | Payer: Medicaid Other | Source: Ambulatory Visit | Attending: Obstetrics & Gynecology | Admitting: Obstetrics & Gynecology

## 2016-06-09 DIAGNOSIS — O99341 Other mental disorders complicating pregnancy, first trimester: Secondary | ICD-10-CM | POA: Insufficient documentation

## 2016-06-09 DIAGNOSIS — O99511 Diseases of the respiratory system complicating pregnancy, first trimester: Secondary | ICD-10-CM | POA: Insufficient documentation

## 2016-06-09 DIAGNOSIS — Z9889 Other specified postprocedural states: Secondary | ICD-10-CM | POA: Diagnosis not present

## 2016-06-09 DIAGNOSIS — E860 Dehydration: Secondary | ICD-10-CM | POA: Diagnosis not present

## 2016-06-09 DIAGNOSIS — O26891 Other specified pregnancy related conditions, first trimester: Secondary | ICD-10-CM | POA: Diagnosis present

## 2016-06-09 DIAGNOSIS — O99281 Endocrine, nutritional and metabolic diseases complicating pregnancy, first trimester: Secondary | ICD-10-CM | POA: Diagnosis not present

## 2016-06-09 DIAGNOSIS — Z3A08 8 weeks gestation of pregnancy: Secondary | ICD-10-CM | POA: Insufficient documentation

## 2016-06-09 DIAGNOSIS — O210 Mild hyperemesis gravidarum: Secondary | ICD-10-CM

## 2016-06-09 DIAGNOSIS — Z79899 Other long term (current) drug therapy: Secondary | ICD-10-CM | POA: Insufficient documentation

## 2016-06-09 DIAGNOSIS — Z885 Allergy status to narcotic agent status: Secondary | ICD-10-CM | POA: Diagnosis not present

## 2016-06-09 LAB — URINE MICROSCOPIC-ADD ON

## 2016-06-09 LAB — CBC
HEMATOCRIT: 41.4 % (ref 36.0–46.0)
HEMOGLOBIN: 14.7 g/dL (ref 12.0–15.0)
MCH: 31.5 pg (ref 26.0–34.0)
MCHC: 35.5 g/dL (ref 30.0–36.0)
MCV: 88.7 fL (ref 78.0–100.0)
Platelets: 258 10*3/uL (ref 150–400)
RBC: 4.67 MIL/uL (ref 3.87–5.11)
RDW: 12.9 % (ref 11.5–15.5)
WBC: 13.6 10*3/uL — ABNORMAL HIGH (ref 4.0–10.5)

## 2016-06-09 LAB — URINALYSIS, ROUTINE W REFLEX MICROSCOPIC
GLUCOSE, UA: NEGATIVE mg/dL
Ketones, ur: >80 mg/dL — AB
LEUKOCYTES UA: NEGATIVE
Nitrite: NEGATIVE
PH: 6 (ref 5.0–8.0)
Protein, ur: 100 mg/dL — AB

## 2016-06-09 LAB — COMPREHENSIVE METABOLIC PANEL
ALBUMIN: 5 g/dL (ref 3.5–5.0)
ALK PHOS: 54 U/L (ref 38–126)
ALT: 13 U/L — ABNORMAL LOW (ref 14–54)
ANION GAP: 13 (ref 5–15)
AST: 17 U/L (ref 15–41)
BILIRUBIN TOTAL: 1.7 mg/dL — AB (ref 0.3–1.2)
BUN: 11 mg/dL (ref 6–20)
CALCIUM: 10.3 mg/dL (ref 8.9–10.3)
CO2: 22 mmol/L (ref 22–32)
Chloride: 100 mmol/L — ABNORMAL LOW (ref 101–111)
Creatinine, Ser: 0.58 mg/dL (ref 0.44–1.00)
GLUCOSE: 77 mg/dL (ref 65–99)
Potassium: 3.4 mmol/L — ABNORMAL LOW (ref 3.5–5.1)
Sodium: 135 mmol/L (ref 135–145)
TOTAL PROTEIN: 8.4 g/dL — AB (ref 6.5–8.1)

## 2016-06-09 MED ORDER — FAMOTIDINE IN NACL 20-0.9 MG/50ML-% IV SOLN
20.0000 mg | Freq: Once | INTRAVENOUS | Status: AC
  Administered 2016-06-09: 20 mg via INTRAVENOUS
  Filled 2016-06-09: qty 50

## 2016-06-09 MED ORDER — M.V.I. ADULT IV INJ
INJECTION | Freq: Once | INTRAVENOUS | Status: AC
  Administered 2016-06-09: 20:00:00 via INTRAVENOUS
  Filled 2016-06-09: qty 10

## 2016-06-09 MED ORDER — METOCLOPRAMIDE HCL 5 MG/ML IJ SOLN
10.0000 mg | Freq: Once | INTRAMUSCULAR | Status: AC
  Administered 2016-06-09: 10 mg via INTRAVENOUS
  Filled 2016-06-09: qty 2

## 2016-06-09 MED ORDER — LACTATED RINGERS IV BOLUS (SEPSIS)
1000.0000 mL | Freq: Once | INTRAVENOUS | Status: AC
  Administered 2016-06-09: 1000 mL via INTRAVENOUS

## 2016-06-09 NOTE — Discharge Instructions (Signed)
Eating Plan for Hyperemesis Gravidarum Severe cases of hyperemesis gravidarum can lead to dehydration and malnutrition. The hyperemesis eating plan is one way to lessen the symptoms of nausea and vomiting. It is often used with prescribed medicines to control your symptoms.  WHAT CAN I DO TO RELIEVE MY SYMPTOMS? Listen to your body. Everyone is different and has different preferences. Find what works best for you. Some of the following things may help:  Eat and drink slowly.  Eat 5-6 small meals daily instead of 3 large meals.   Eat crackers before you get out of bed in the morning.   Starchy foods are usually well tolerated (such as cereal, toast, bread, potatoes, pasta, rice, and pretzels).   Ginger may help with nausea. Add  tsp ground ginger to hot tea or choose ginger tea.   Try drinking 100% fruit juice or an electrolyte drink.  Continue to take your prenatal vitamins as directed by your health care provider. If you are having trouble taking your prenatal vitamins, talk with your health care provider about different options.  Include at least 1 serving of protein with your meals and snacks (such as meats or poultry, beans, nuts, eggs, or yogurt). Try eating a protein-rich snack before bed (such as cheese and crackers or a half turkey or peanut butter sandwich). WHAT THINGS SHOULD I AVOID TO REDUCE MY SYMPTOMS? The following things may help reduce your symptoms:  Avoid foods with strong smells. Try eating meals in well-ventilated areas that are free of odors.  Avoid drinking water or other beverages with meals. Try not to drink anything less than 30 minutes before and after meals.  Avoid drinking more than 1 cup of fluid at a time.  Avoid fried or high-fat foods, such as butter and cream sauces.  Avoid spicy foods.  Avoid skipping meals the best you can. Nausea can be more intense on an empty stomach. If you cannot tolerate food at that time, do not force it. Try sucking on  ice chips or other frozen items and make up the calories later.  Avoid lying down within 2 hours after eating.   This information is not intended to replace advice given to you by your health care provider. Make sure you discuss any questions you have with your health care provider.   Document Released: 07/23/2007 Document Revised: 09/30/2013 Document Reviewed: 07/30/2013 Elsevier Interactive Patient Education 2016 Elsevier Inc. Hyperemesis Gravidarum Hyperemesis gravidarum is a severe form of nausea and vomiting that happens during pregnancy. Hyperemesis is worse than morning sickness. It may cause you to have nausea or vomiting all day for many days. It may keep you from eating and drinking enough food and liquids. Hyperemesis usually occurs during the first half (the first 20 weeks) of pregnancy. It often goes away once a woman is in her second half of pregnancy. However, sometimes hyperemesis continues through an entire pregnancy.  CAUSES  The cause of this condition is not completely known but is thought to be related to changes in the body's hormones when pregnant. It could be from the high level of the pregnancy hormone or an increase in estrogen in the body.  SIGNS AND SYMPTOMS   Severe nausea and vomiting.  Nausea that does not go away.  Vomiting that does not allow you to keep any food down.  Weight loss and body fluid loss (dehydration).  Having no desire to eat or not liking food you have previously enjoyed. DIAGNOSIS  Your health care provider will   do a physical exam and ask you about your symptoms. He or she may also order blood tests and urine tests to make sure something else is not causing the problem.  TREATMENT  You may only need medicine to control the problem. If medicines do not control the nausea and vomiting, you will be treated in the hospital to prevent dehydration, increased acid in the blood (acidosis), weight loss, and changes in the electrolytes in your body  that may harm the unborn baby (fetus). You may need IV fluids.  HOME CARE INSTRUCTIONS   Only take over-the-counter or prescription medicines as directed by your health care provider.  Try eating a couple of dry crackers or toast in the morning before getting out of bed.  Avoid foods and smells that upset your stomach.  Avoid fatty and spicy foods.  Eat 5-6 small meals a day.  Do not drink when eating meals. Drink between meals.  For snacks, eat high-protein foods, such as cheese.  Eat or suck on things that have ginger in them. Ginger helps nausea.  Avoid food preparation. The smell of food can spoil your appetite.  Avoid iron pills and iron in your multivitamins until after 3-4 months of being pregnant. However, consult with your health care provider before stopping any prescribed iron pills. SEEK MEDICAL CARE IF:   Your abdominal pain increases.  You have a severe headache.  You have vision problems.  You are losing weight. SEEK IMMEDIATE MEDICAL CARE IF:   You are unable to keep fluids down.  You vomit blood.  You have constant nausea and vomiting.  You have excessive weakness.  You have extreme thirst.  You have dizziness or fainting.  You have a fever or persistent symptoms for more than 2-3 days.  You have a fever and your symptoms suddenly get worse. MAKE SURE YOU:   Understand these instructions.  Will watch your condition.  Will get help right away if you are not doing well or get worse.   This information is not intended to replace advice given to you by your health care provider. Make sure you discuss any questions you have with your health care provider.   Document Released: 09/25/2005 Document Revised: 07/16/2013 Document Reviewed: 05/07/2013 Elsevier Interactive Patient Education 2016 Elsevier Inc.  

## 2016-06-09 NOTE — MAU Provider Note (Signed)
History     CSN: [id number]  Arrival date and time: 06/09/16 1750   None     Chief Complaint  Patient presents with  . Emesis During Pregnancy   G3P2002 @[redacted]w[redacted]d  c/o nausea and vomiting since yesterday am. She is unable to tolerate food or fluids. She's been seen in MAU 3 times in the last 2 weeks for N/V and/or dehydration. She was sent home with Phenergan po/pv and Reglan. She was using the Reglan and Phenergan po until yesterday, when that failed to help she switched to pv and still had no relief. She denies fever, chills, and diarrhea. She denies VB or pain. She was informed of +CMT from last visit but has not taken Rx yet d/t N/V. She has lost a total of 6 lbs since her initial presentation.   OB History    Gravida Para Term Preterm AB Living   3 2 2     2    SAB TAB Ectopic Multiple Live Births           2      Past Medical History:  Diagnosis Date  . Anxiety   . Asthma   . 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 Right ureteral stone   . Yeast infection     Past Surgical History:  Procedure Laterality Date  . CYSTOSCOPY W/ RETROGRADES  06/03/2012   Procedure: CYSTOSCOPY WITH RETROGRADE PYELOGRAM;  Surgeon: Valetta Fuller, MD;  Location: [facility];  Service: Urology;  Laterality: Right;    Family History  Problem Relation Age of Onset  . Hypertension Mother   . Cancer Father     lung  . Cancer Paternal Aunt     breast  . Von Willebrand disease Sister   . Hypertension Maternal Grandmother   . Cancer Paternal Grandmother     unknown origin  . Cancer Paternal Grandfather     unknown origin  . Anesthesia problems Neg Hx     Social History  Substance Use Topics  . Smoking status: Never Smoker  . Smokeless tobacco: Never Used  . Alcohol use No     Comment: occasional    Allergies:  Allergies  Allergen Reactions  . Vicodin [Hydrocodone-Acetaminophen] Nausea And Vomiting    Prescriptions Prior to  Admission  Medication Sig Dispense Refill Last Dose  . azithromycin (ZITHROMAX) 500 MG tablet Take two tablets by mouth once 2 tablet 0   . metoCLOPramide (REGLAN) 10 MG tablet Take 1 tablet (10 mg total) by mouth 3 (three) times daily before meals. 60 tablet 1   . promethazine (PHENERGAN) 12.5 MG tablet   1   . ranitidine (ZANTAC) 150 MG tablet Take 1 tablet (150 mg total) by mouth 2 (two) times daily. 60 tablet 1     Review of Systems  Gastrointestinal: Positive for nausea and vomiting. Negative for abdominal pain and diarrhea.  Neurological: Positive for dizziness (lightheaded, intermittently) and weakness.   Physical Exam   Blood pressure 107/73, pulse 92, temperature 98.3 F (36.8 C), height 5\' 2"  (1.575 m), weight 51.7 kg (114 lb 0.6 oz), last menstrual period 02/09/2016, unknown if currently breastfeeding.  Physical Exam  Constitutional: She is oriented to person, place, and time. She appears well-developed and well-nourished. She has a sickly appearance.  HENT:  Head: Normocephalic and atraumatic.  Neck: Normal range of motion. Neck supple.  Cardiovascular: Normal rate.   Respiratory: Effort normal.  GI: Soft. She exhibits no  distension. There is no tenderness.  Musculoskeletal: Normal range of motion.  Neurological: She is alert and oriented to person, place, and time.  Skin: Skin is warm and dry.  Psychiatric: She has a normal mood and affect.   Results for orders placed or performed during the hospital encounter of 06/09/16 (from the past 24 hour(s))  Urinalysis, Routine w reflex microscopic ([facility])     Status: Abnormal   Collection Time: 06/09/16  6:00 PM  Result Value Ref Range   Color, Urine YELLOW YELLOW   APPearance CLEAR CLEAR   Specific Gravity, Urine >1.030 (H) 1.005 - 1.030   pH 6.0 5.0 - 8.0   Glucose, UA NEGATIVE NEGATIVE mg/dL   Hgb urine dipstick SMALL (A) NEGATIVE   Bilirubin Urine SMALL (A) NEGATIVE   Ketones, ur >80 (A) NEGATIVE mg/dL    Protein, ur 161100 (A) NEGATIVE mg/dL   Nitrite NEGATIVE NEGATIVE   Leukocytes, UA NEGATIVE NEGATIVE  Urine microscopic-add on     Status: Abnormal   Collection Time: 06/09/16  6:00 PM  Result Value Ref Range   Squamous Epithelial / LPF 0-5 (A) NONE SEEN   WBC, UA 0-5 0 - 5 WBC/hpf   RBC / HPF 0-5 0 - 5 RBC/hpf   Bacteria, UA FEW (A) NONE SEEN   Urine-Other MUCOUS PRESENT   CBC     Status: Abnormal   Collection Time: 06/09/16  6:40 PM  Result Value Ref Range   WBC 13.6 (H) 4.0 - 10.5 K/uL   RBC 4.67 3.87 - 5.11 MIL/uL   Hemoglobin 14.7 12.0 - 15.0 g/dL   HCT 09.641.4 04.536.0 - 40.946.0 %   MCV 88.7 78.0 - 100.0 fL   MCH 31.5 26.0 - 34.0 pg   MCHC 35.5 30.0 - 36.0 g/dL   RDW 81.112.9 91.411.5 - 78.215.5 %   Platelets 258 150 - 400 K/uL  Comprehensive metabolic panel     Status: Abnormal   Collection Time: 06/09/16  6:40 PM  Result Value Ref Range   Sodium 135 135 - 145 mmol/L   Potassium 3.4 (L) 3.5 - 5.1 mmol/L   Chloride 100 (L) 101 - 111 mmol/L   CO2 22 22 - 32 mmol/L   Glucose, Bld 77 65 - 99 mg/dL   BUN 11 6 - 20 mg/dL   Creatinine, Ser 9.560.58 0.44 - 1.00 mg/dL   Calcium 21.310.3 8.9 - 08.610.3 mg/dL   Total Protein 8.4 (H) 6.5 - 8.1 g/dL   Albumin 5.0 3.5 - 5.0 g/dL   AST 17 15 - 41 U/L   ALT 13 (L) 14 - 54 U/L   Alkaline Phosphatase 54 38 - 126 U/L   Total Bilirubin 1.7 (H) 0.3 - 1.2 mg/dL   GFR calc non Af Amer >60 >60 mL/min   GFR calc Af Amer >60 >60 mL/min   Anion gap 13 5 - 15    MAU Course  Procedures LR 1L bolus MTV in LR IV Reglan 10 mg IV Pepcid 20 mg IV  MDM Feeling better after meds. Tolerating pt. No emesis. Stable for discharge home.   Assessment and Plan   1. Dehydration   2. Hyperemesis gravidarum    Discharge home Reglan 10 mg po q8 hrs (has Rx) Phenergan 12.5-25 mg po/pr (has Rx) Pepcid 20 mg po daily (has Rx) Follow up with OB/GYN Provider of choice in 1-2 weeks Return for worsening sx  Marie Holt 8:48 PM  06/09/16

## 2016-06-09 NOTE — MAU Note (Signed)
Patient continues to have vomiting continously since yesterday morning, tried to take the medication prescribed not working.

## 2016-06-15 ENCOUNTER — Encounter (HOSPITAL_COMMUNITY): Payer: Self-pay

## 2016-06-15 ENCOUNTER — Inpatient Hospital Stay (HOSPITAL_COMMUNITY)
Admission: AD | Admit: 2016-06-15 | Discharge: 2016-06-15 | Disposition: A | Discharge status: Home / Self Care | Payer: Medicaid Other | Source: Ambulatory Visit | Attending: Obstetrics and Gynecology | Admitting: Obstetrics and Gynecology

## 2016-06-15 DIAGNOSIS — O99281 Endocrine, nutritional and metabolic diseases complicating pregnancy, first trimester: Secondary | ICD-10-CM | POA: Insufficient documentation

## 2016-06-15 DIAGNOSIS — J45909 Unspecified asthma, uncomplicated: Secondary | ICD-10-CM | POA: Insufficient documentation

## 2016-06-15 DIAGNOSIS — O99341 Other mental disorders complicating pregnancy, first trimester: Secondary | ICD-10-CM | POA: Diagnosis not present

## 2016-06-15 DIAGNOSIS — O210 Mild hyperemesis gravidarum: Secondary | ICD-10-CM

## 2016-06-15 DIAGNOSIS — Z87442 Personal history of urinary calculi: Secondary | ICD-10-CM | POA: Diagnosis not present

## 2016-06-15 DIAGNOSIS — F419 Anxiety disorder, unspecified: Secondary | ICD-10-CM | POA: Insufficient documentation

## 2016-06-15 DIAGNOSIS — E860 Dehydration: Secondary | ICD-10-CM | POA: Diagnosis not present

## 2016-06-15 DIAGNOSIS — O99511 Diseases of the respiratory system complicating pregnancy, first trimester: Secondary | ICD-10-CM | POA: Diagnosis not present

## 2016-06-15 DIAGNOSIS — Z3A09 9 weeks gestation of pregnancy: Secondary | ICD-10-CM | POA: Insufficient documentation

## 2016-06-15 DIAGNOSIS — O219 Vomiting of pregnancy, unspecified: Secondary | ICD-10-CM | POA: Diagnosis present

## 2016-06-15 LAB — URINE MICROSCOPIC-ADD ON

## 2016-06-15 LAB — URINALYSIS, ROUTINE W REFLEX MICROSCOPIC
Glucose, UA: NEGATIVE mg/dL
KETONES UR: 40 mg/dL — AB
LEUKOCYTES UA: NEGATIVE
NITRITE: NEGATIVE
PH: 6 (ref 5.0–8.0)
PROTEIN: 100 mg/dL — AB
Specific Gravity, Urine: >1.03 — ABNORMAL HIGH (ref 1.005–1.030)

## 2016-06-15 MED ORDER — DEXAMETHASONE SODIUM PHOSPHATE 10 MG/ML IJ SOLN
10.0000 mg | Freq: Once | INTRAMUSCULAR | Status: AC
  Administered 2016-06-15: 10 mg via INTRAVENOUS
  Filled 2016-06-15: qty 1

## 2016-06-15 MED ORDER — METHYLPREDNISOLONE 4 MG PO TBPK: ORAL_TABLET | ORAL | 0 refills | Status: DC

## 2016-06-15 MED ORDER — DEXTROSE IN LACTATED RINGERS 5 % IV SOLN
Freq: Once | INTRAVENOUS | Status: AC
  Administered 2016-06-15: 10:00:00 via INTRAVENOUS
  Filled 2016-06-15: qty 10

## 2016-06-15 MED ORDER — PROMETHAZINE HCL 25 MG/ML IJ SOLN
25.0000 mg | Freq: Once | INTRAMUSCULAR | Status: AC
Start: 2016-06-15 — End: 2016-06-15
  Administered 2016-06-15: 25 mg via INTRAVENOUS
  Filled 2016-06-15: qty 1

## 2016-06-15 NOTE — MAU Provider Note (Signed)
Chief Complaint: Emesis During Pregnancy   First Provider Initiated Contact with Patient 06/15/16 [phone]        SUBJECTIVE HPI: Marie Holt is a 25 y.o. G3P2002 at [redacted]w[redacted]d by LMP who presents to maternity admissions reporting Recurrent nausea and vomiting. States has been taking meds and doing well until yesterday when vomiting returned despite meds.  She denies vaginal bleeding, vaginal itching/burning, urinary symptoms, h/a, dizziness, or fever/chills.    Emesis   This is a recurrent problem. The current episode started yesterday. The problem occurs 5 to 10 times per day. The problem has been unchanged. There has been no fever. Associated symptoms include weight loss. Pertinent negatives include no abdominal pain, chest pain, chills, coughing, diarrhea, dizziness, fever, headaches or myalgias. Treatments tried: Prescribed meds.   RN Note: Pt reports she has been having N/V. Taking anit nausea medication without releif   Past Medical History:  Diagnosis Date  . Anxiety   . Asthma   . 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 Right ureteral stone   . Yeast infection    Past Surgical History:  Procedure Laterality Date  . CYSTOSCOPY W/ RETROGRADES  06/03/2012   Procedure: CYSTOSCOPY WITH RETROGRADE PYELOGRAM;  Surgeon: Valetta Fuller, MD;  Location: [facility];  Service: Urology;  Laterality: Right;   Social History   Social History  . Marital status: Single    Spouse name: N/A  . Number of children: N/A  . Years of education: N/A   Occupational History  . Not on file.   Social History Main Topics  . Smoking status: Never Smoker  . Smokeless tobacco: Never Used  . Alcohol use No     Comment: occasional  . Drug use: No     Comment: denies any usuage  . Sexual activity: Yes    Birth control/ protection: None   Other Topics Concern  . Not on file   Social History Narrative  . No narrative on file   No current  facility-administered medications on file prior to encounter.    Current Outpatient Prescriptions on File Prior to Encounter  Medication Sig Dispense Refill  . azithromycin (ZITHROMAX) 500 MG tablet Take two tablets by mouth once (Patient not taking: Reported on 06/09/2016) 2 tablet 0  . metoCLOPramide (REGLAN) 10 MG tablet Take 1 tablet (10 mg total) by mouth 3 (three) times daily before meals. 60 tablet 1  . promethazine (PHENERGAN) 12.5 MG tablet Take 12.5 mg by mouth every 6 (six) hours as needed for nausea or vomiting.   1  . ranitidine (ZANTAC) 150 MG tablet Take 1 tablet (150 mg total) by mouth 2 (two) times daily. 60 tablet 1   Allergies  Allergen Reactions  . Vicodin [Hydrocodone-Acetaminophen] Nausea And Vomiting    I have reviewed patient's Past Medical Hx, Surgical Hx, Family Hx, Social Hx, medications and allergies.   ROS:  Review of Systems  Constitutional: Positive for weight loss. Negative for chills and fever.  Respiratory: Negative for cough.   Cardiovascular: Negative for chest pain.  Gastrointestinal: Positive for vomiting. Negative for abdominal pain and diarrhea.  Musculoskeletal: Negative for myalgias.  Neurological: Negative for dizziness and headaches.   Other systems negative  Physical Exam  Patient Vitals for the past 24 hrs:  BP Temp Pulse Resp Height Weight  06/15/16 0904 103/79 - 100 16 - -  06/15/16 0858 - - - - 5\' 2"  (1.575 m) 113 lb (51.3  kg)   Physical Exam  Constitutional: Well-developed, well-nourished female in no acute distress.  Cardiovascular: normal rate Respiratory: normal effort GI: Abd soft, non-tender. Pos BS x 4 MS: Extremities nontender, no edema, normal ROM Neurologic: Alert and oriented x 4.  GU: Neg CVAT.  PELVIC EXAM: deferred  FHT 160 by bedside US, single live fetus, gestational sac normal shape, appearance c/w 9+ weeks  LAB RESULTS Results for orders placed or performed during the hospital encounter of 06/15/16 (from  the past 24 hour(s))  Urinalysis, Routine w reflex microscopic ([facility])     Status: Abnormal   Collection Time: 06/15/16  8:53 AM  Result Value Ref Range   Color, Urine YELLOW YELLOW   APPearance CLEAR CLEAR   Specific Gravity, Urine >1.030 (H) 1.005 - 1.030   pH 6.0 5.0 - 8.0   Glucose, UA NEGATIVE NEGATIVE mg/dL   Hgb urine dipstick LARGE (A) NEGATIVE   Bilirubin Urine SMALL (A) NEGATIVE   Ketones, ur 40 (A) NEGATIVE mg/dL   Protein, ur 161100 (A) NEGATIVE mg/dL   Nitrite NEGATIVE NEGATIVE   Leukocytes, UA NEGATIVE NEGATIVE  Urine microscopic-add on     Status: Abnormal   Collection Time: 06/15/16  8:53 AM  Result Value Ref Range   Squamous Epithelial / LPF 0-5 (A) NONE SEEN   WBC, UA 0-5 0 - 5 WBC/hpf   RBC / HPF 0-5 0 - 5 RBC/hpf   Bacteria, UA FEW (A) NONE SEEN       IMAGING [place] Ob Transvaginal  Result Date: 05/25/2016 CLINICAL DATA:  Excessive vomiting since yesterday morning, pain, early pregnancy. EXAM: TRANSVAGINAL OB ULTRASOUND TECHNIQUE: Transvaginal ultrasound was performed for complete evaluation of the gestation as well as the maternal uterus, adnexal regions, and pelvic cul-de-sac. COMPARISON:  None. FINDINGS: Intrauterine gestational sac: Single intrauterine gestational sac with appropriate surrounding decidual reaction. Yolk sac:  Normal Embryo:  Present Cardiac Activity: Present Heart Rate: 142 bpm MSD:   mm    w     d CRL:   9  mm   6 w 5 d                  US EDC: 01/13/2017 Subchorionic hemorrhage: Questionable small subchorionic bleed, but not convincing. Maternal uterus/adnexae: Maternal ovaries are unremarkable with expected corpus luteum in the right ovary. No mass or free fluid identified within either adnexal region. No mass or free fluid seen in the cul-de-sac. IMPRESSION: 1. Single live intrauterine pregnancy with estimated gestational age of [redacted] weeks and 5 days. Fetal heart rate of 142 beats per minute. 2. No abnormality identified. Questionable small  subchorionic bleed, but not convincing. Electronically Signed   By: Bary RichardStan  Maynard M.D.   On: 05/25/2016 14:42    MAU Management/MDM: IV ordered D5LR with MVI given at 97099ml/hr Phenergan 25mg  IV Will add Decadron 10mg  and send her home with Steroid taper Will follow with NS IV until patient has to urinate Felt better after first bag of fluid, able to tolerate POs.  ASSESSMENT SIUP at 1968w5d Hyperemesis Dehydration, moderate  PLAN Discharge home Rx Medrol dosepack #21 tabs Encouraged to seek prenatal care, list of numbers given Dehydration precautions Advance diet as tolerated   Pt stable at time of discharge. Encouraged to return here or to other Urgent Care/ED if she develops worsening of symptoms, increase in pain, fever, or other concerning symptoms.    Wynelle BourgeoisMarie Auset Fritzler CNM, MSN Certified Nurse-Midwife 06/15/2016  9:45 AM

## 2016-06-15 NOTE — MAU Note (Signed)
Pt reports she has been having N/V. Taking anit nausea medication without releif.

## 2016-06-15 NOTE — Discharge Instructions (Signed)
Hyperemesis Gravidarum  Hyperemesis gravidarum is a severe form of nausea and vomiting that happens during pregnancy. Hyperemesis is worse than morning sickness. It may cause you to have nausea or vomiting all day for many days. It may keep you from eating and drinking enough food and liquids. Hyperemesis usually occurs during the first half (the first 20 weeks) of pregnancy. It often goes away once a woman is in her second half of pregnancy. However, sometimes hyperemesis continues through an entire pregnancy.   CAUSES   The cause of this condition is not completely known but is thought to be related to changes in the body's hormones when pregnant. It could be from the high level of the pregnancy hormone or an increase in estrogen in the body.   SIGNS AND SYMPTOMS    Severe nausea and vomiting.   Nausea that does not go away.   Vomiting that does not allow you to keep any food down.   Weight loss and body fluid loss (dehydration).   Having no desire to eat or not liking food you have previously enjoyed.  DIAGNOSIS   Your health care provider will do a physical exam and ask you about your symptoms. He or she may also order blood tests and urine tests to make sure something else is not causing the problem.   TREATMENT   You may only need medicine to control the problem. If medicines do not control the nausea and vomiting, you will be treated in the hospital to prevent dehydration, increased acid in the blood (acidosis), weight loss, and changes in the electrolytes in your body that may harm the unborn baby (fetus). You may need IV fluids.   HOME CARE INSTRUCTIONS    Only take over-the-counter or prescription medicines as directed by your health care provider.   Try eating a couple of dry crackers or toast in the morning before getting out of bed.   Avoid foods and smells that upset your stomach.   Avoid fatty and spicy foods.   Eat 5-6 small meals a day.   Do not drink when eating meals. Drink between  meals.   For snacks, eat high-protein foods, such as cheese.   Eat or suck on things that have ginger in them. Ginger helps nausea.   Avoid food preparation. The smell of food can spoil your appetite.   Avoid iron pills and iron in your multivitamins until after 3-4 months of being pregnant. However, consult with your health care provider before stopping any prescribed iron pills.  SEEK MEDICAL CARE IF:    Your abdominal pain increases.   You have a severe headache.   You have vision problems.   You are losing weight.  SEEK IMMEDIATE MEDICAL CARE IF:    You are unable to keep fluids down.   You vomit blood.   You have constant nausea and vomiting.   You have excessive weakness.   You have extreme thirst.   You have dizziness or fainting.   You have a fever or persistent symptoms for more than 2-3 days.   You have a fever and your symptoms suddenly get worse.  MAKE SURE YOU:    Understand these instructions.   Will watch your condition.   Will get help right away if you are not doing well or get worse.     This information is not intended to replace advice given to you by your health care provider. Make sure you discuss any questions you have with   your health care provider.     Document Released: 09/25/2005 Document Revised: 07/16/2013 Document Reviewed: 05/07/2013  [employer]vier Interactive Patient Education 2016 [employer]

## 2016-06-23 ENCOUNTER — Encounter (HOSPITAL_COMMUNITY): Payer: Self-pay

## 2016-06-23 ENCOUNTER — Inpatient Hospital Stay (HOSPITAL_COMMUNITY)
Admission: AD | Admit: 2016-06-23 | Discharge: 2016-06-23 | Disposition: A | Discharge status: Home / Self Care | Payer: Medicaid Other | Source: Ambulatory Visit | Attending: Obstetrics and Gynecology | Admitting: Obstetrics and Gynecology

## 2016-06-23 DIAGNOSIS — F419 Anxiety disorder, unspecified: Secondary | ICD-10-CM | POA: Diagnosis not present

## 2016-06-23 DIAGNOSIS — O99341 Other mental disorders complicating pregnancy, first trimester: Secondary | ICD-10-CM | POA: Insufficient documentation

## 2016-06-23 DIAGNOSIS — J45909 Unspecified asthma, uncomplicated: Secondary | ICD-10-CM | POA: Insufficient documentation

## 2016-06-23 DIAGNOSIS — O99511 Diseases of the respiratory system complicating pregnancy, first trimester: Secondary | ICD-10-CM | POA: Insufficient documentation

## 2016-06-23 DIAGNOSIS — O210 Mild hyperemesis gravidarum: Secondary | ICD-10-CM | POA: Insufficient documentation

## 2016-06-23 DIAGNOSIS — Z87442 Personal history of urinary calculi: Secondary | ICD-10-CM | POA: Diagnosis not present

## 2016-06-23 DIAGNOSIS — M5126 Other intervertebral disc displacement, lumbar region: Secondary | ICD-10-CM | POA: Diagnosis not present

## 2016-06-23 DIAGNOSIS — Z3A10 10 weeks gestation of pregnancy: Secondary | ICD-10-CM | POA: Insufficient documentation

## 2016-06-23 DIAGNOSIS — R112 Nausea with vomiting, unspecified: Secondary | ICD-10-CM | POA: Diagnosis present

## 2016-06-23 DIAGNOSIS — O26891 Other specified pregnancy related conditions, first trimester: Secondary | ICD-10-CM | POA: Insufficient documentation

## 2016-06-23 LAB — COMPREHENSIVE METABOLIC PANEL
ALBUMIN: 4.4 g/dL (ref 3.5–5.0)
ALT: 12 U/L — AB (ref 14–54)
AST: 16 U/L (ref 15–41)
Alkaline Phosphatase: 49 U/L (ref 38–126)
Anion gap: 9 (ref 5–15)
BILIRUBIN TOTAL: 1 mg/dL (ref 0.3–1.2)
BUN: 12 mg/dL (ref 6–20)
CO2: 26 mmol/L (ref 22–32)
CREATININE: 0.59 mg/dL (ref 0.44–1.00)
Calcium: 10 mg/dL (ref 8.9–10.3)
Chloride: 103 mmol/L (ref 101–111)
GFR calc Af Amer: >60 mL/min (ref >60)
GLUCOSE: 80 mg/dL (ref 65–99)
POTASSIUM: 3.7 mmol/L (ref 3.5–5.1)
Sodium: 138 mmol/L (ref 135–145)
TOTAL PROTEIN: 7.5 g/dL (ref 6.5–8.1)

## 2016-06-23 LAB — CBC
HEMATOCRIT: 39.9 % (ref 36.0–46.0)
HEMOGLOBIN: 14 g/dL (ref 12.0–15.0)
MCH: 31.5 pg (ref 26.0–34.0)
MCHC: 35.1 g/dL (ref 30.0–36.0)
MCV: 89.7 fL (ref 78.0–100.0)
Platelets: 227 10*3/uL (ref 150–400)
RBC: 4.45 MIL/uL (ref 3.87–5.11)
RDW: 13.1 % (ref 11.5–15.5)
WBC: 11.9 10*3/uL — AB (ref 4.0–10.5)

## 2016-06-23 LAB — URINE MICROSCOPIC-ADD ON

## 2016-06-23 LAB — URINALYSIS, ROUTINE W REFLEX MICROSCOPIC
GLUCOSE, UA: NEGATIVE mg/dL
Leukocytes, UA: NEGATIVE
NITRITE: NEGATIVE
PH: 6 (ref 5.0–8.0)
Protein, ur: 100 mg/dL — AB

## 2016-06-23 LAB — LIPASE, BLOOD: LIPASE: 26 U/L (ref 11–51)

## 2016-06-23 LAB — AMYLASE: Amylase: 70 U/L (ref 28–100)

## 2016-06-23 MED ORDER — DEXTROSE 5 % IN LACTATED RINGERS IV BOLUS
1000.0000 mL | Freq: Once | INTRAVENOUS | Status: AC
  Administered 2016-06-23: 1000 mL via INTRAVENOUS

## 2016-06-23 MED ORDER — SCOPOLAMINE 1 MG/3DAYS TD PT72: 1.0000 | MEDICATED_PATCH | TRANSDERMAL | 12 refills | Status: DC

## 2016-06-23 MED ORDER — ONDANSETRON HCL 4 MG PO TABS: 4.0000 mg | ORAL_TABLET | Freq: Three times a day (TID) | ORAL | 3 refills | Status: DC | PRN

## 2016-06-23 MED ORDER — LACTATED RINGERS IV SOLN
Freq: Once | INTRAVENOUS | Status: AC
  Administered 2016-06-23: 13:00:00 via INTRAVENOUS
  Filled 2016-06-23: qty 10

## 2016-06-23 MED ORDER — PROMETHAZINE HCL 25 MG/ML IJ SOLN
25.0000 mg | Freq: Once | INTRAMUSCULAR | Status: AC
  Administered 2016-06-23: 25 mg via INTRAVENOUS
  Filled 2016-06-23: qty 1

## 2016-06-23 MED ORDER — SCOPOLAMINE 1 MG/3DAYS TD PT72
1.0000 | MEDICATED_PATCH | TRANSDERMAL | Status: DC
Start: 2016-06-23 — End: 2016-06-23
  Administered 2016-06-23: 1.5 mg via TRANSDERMAL
  Filled 2016-06-23: qty 1

## 2016-06-23 MED ORDER — ONDANSETRON HCL 4 MG/2ML IJ SOLN
4.0000 mg | INTRAMUSCULAR | Status: AC
  Administered 2016-06-23: 4 mg via INTRAVENOUS
  Filled 2016-06-23: qty 2

## 2016-06-23 MED ORDER — ONDANSETRON 4 MG PO TBDP: 4.0000 mg | ORAL_TABLET | Freq: Four times a day (QID) | ORAL | 2 refills | Status: DC | PRN

## 2016-06-23 NOTE — Discharge Instructions (Signed)
Eating Plan for Hyperemesis Gravidarum °Severe cases of hyperemesis gravidarum can lead to dehydration and malnutrition. The hyperemesis eating plan is one way to lessen the symptoms of nausea and vomiting. It is often used with prescribed medicines to control your symptoms.  °WHAT CAN I DO TO RELIEVE MY SYMPTOMS? °Listen to your body. Everyone is different and has different preferences. Find what works best for you. Some of the following things may help: °· Eat and drink slowly. °· Eat 5-6 small meals daily instead of 3 large meals.   °· Eat crackers before you get out of bed in the morning.   °· Starchy foods are usually well tolerated (such as cereal, toast, bread, potatoes, pasta, rice, and pretzels).   °· Ginger may help with nausea. Add ¼ tsp ground ginger to hot tea or choose ginger tea.   °· Try drinking 100% fruit juice or an electrolyte drink. °· Continue to take your prenatal vitamins as directed by your health care provider. If you are having trouble taking your prenatal vitamins, talk with your health care provider about different options. °· Include at least 1 serving of protein with your meals and snacks (such as meats or poultry, beans, nuts, eggs, or yogurt). Try eating a protein-rich snack before bed (such as cheese and crackers or a half turkey or peanut butter sandwich). °WHAT THINGS SHOULD I AVOID TO REDUCE MY SYMPTOMS? °The following things may help reduce your symptoms: °· Avoid foods with strong smells. Try eating meals in well-ventilated areas that are free of odors. °· Avoid drinking water or other beverages with meals. Try not to drink anything less than 30 minutes before and after meals. °· Avoid drinking more than 1 cup of fluid at a time. °· Avoid fried or high-fat foods, such as butter and cream sauces. °· Avoid spicy foods. °· Avoid skipping meals the best you can. Nausea can be more intense on an empty stomach. If you cannot tolerate food at that time, do not force it. Try sucking on  ice chips or other frozen items and make up the calories later. °· Avoid lying down within 2 hours after eating. °  °This information is not intended to replace advice given to you by your health care provider. Make sure you discuss any questions you have with your health care provider. °  °Document Released: 07/23/2007 Document Revised: 09/30/2013 Document Reviewed: 07/30/2013 °Elsevier Interactive Patient Education ©2016 [employer] ° °

## 2016-06-23 NOTE — MAU Note (Cosign Needed)
History     CSN: [id number]  Arrival date & time 06/23/16  0936   None     Chief Complaint  Patient presents with  . Hyperemesis Gravidarum    HPI   Marie Holt is a 25 yo female, G3P2002 currently [redacted]w[redacted]d who presents today with nausea and vomiting. It started yesterday morning. She reports not being able to keep anything down not even phenergan. Vomit is yellow/green, has not seen any blood. She feels diaphoretic and increased heart rate when she has a vomiting episode and has associated stomach pain. Nothing makes it better or worse. She endorses dizziness and lightheadedness when she is not laying down. She reports not being able to produce tears. She believes before she was pregnant she was 123 lbs and now she is down to 112 lbs unintentionally.  She has been seen in the MAU twice before for this complaint, on 9/7 and 9/1. She reports receiving IV fluids and IV antiemetics.   Past Medical History:  Diagnosis Date  . Anxiety   . Asthma   . 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 Right ureteral stone   . Yeast infection     Past Surgical History:  Procedure Laterality Date  . CYSTOSCOPY W/ RETROGRADES  06/03/2012   Procedure: CYSTOSCOPY WITH RETROGRADE PYELOGRAM;  Surgeon: Valetta Fuller, MD;  Location: [facility];  Service: Urology;  Laterality: Right;    Family History  Problem Relation Age of Onset  . Hypertension Mother   . Cancer Father     lung  . Cancer Paternal Aunt     breast  . Von Willebrand disease Sister   . Hypertension Maternal Grandmother   . Cancer Paternal Grandmother     unknown origin  . Cancer Paternal Grandfather     unknown origin  . Anesthesia problems Neg Hx     Social History  Substance Use Topics  . Smoking status: Never Smoker  . Smokeless tobacco: Never Used  . Alcohol use No     Comment: occasional    OB History    Gravida Para Term Preterm AB Living   3 2 2     2    SAB TAB  Ectopic Multiple Live Births           2      Review of Systems  Constitutional: Positive for unexpected weight change. Negative for chills and fever.  Gastrointestinal: Positive for nausea and vomiting. Negative for blood in stool and diarrhea.  Genitourinary: Negative for difficulty urinating, dysuria, hematuria, vaginal bleeding and vaginal discharge.       No contractions or rupture of membranes  Neurological: Positive for dizziness and light-headedness. Negative for numbness and headaches.    Allergies  Vicodin [hydrocodone-acetaminophen]  Home Medications   No current facility-administered medications on file prior to encounter.    Current Outpatient Prescriptions on File Prior to Encounter  Medication Sig Dispense Refill  . methylPREDNISolone (MEDROL DOSEPAK) 4 MG TBPK tablet Day 1: 2 tablets before breakfast, 1 after lunch, 1 after dinner, and 2 at bedtime.  Day 2: 1 tablet before breakfast, 1 after lunch, 1 after dinner, and 2 at bedtime Day 3: 1 tablet before breakfast, 1 after lunch, 1 after dinner, and 1at bedtime Day 4: 1tablet before breakfast, 1 after lunch, and 1 at bedtime Day 5: 1 tablet before breakfast and 1 at bedtime Day 6: 1 tablet before breakfast 21 tablet 0  .  ranitidine (ZANTAC) 150 MG tablet Take 1 tablet (150 mg total) by mouth 2 (two) times daily. 60 tablet 1   Vitals BP 95/59 (BP Location: Left Arm)   Pulse 94   Temp 98.2 F (36.8 C) (Oral)   Resp 16   Ht 5\' 2"  (1.575 m)   Wt 50.8 kg (112 lb 1.3 oz)   LMP 02/09/2016 (Approximate) Comment: doesn't come every month  BMI 20.50 kg/m    On 8/3 weight was recorded to be 126 lbs.  Physical Exam  Constitutional: She appears well-developed.  She looks tired. Spits up occasionally during interview and exam  Cardiovascular: Normal rate and regular rhythm.   Pulmonary/Chest: Effort normal and breath sounds normal. No respiratory distress.  Abdominal: She exhibits no distension. There is tenderness.  There is no guarding.  LUQ     MAU Course  Procedures (including critical care time)  Labs Reviewed  URINALYSIS, ROUTINE W REFLEX MICROSCOPIC (NOT AT [facility]) - Abnormal; Notable for the following:       Result Value   Specific Gravity, Urine >1.030 (*)    Hgb urine dipstick TRACE (*)    Bilirubin Urine SMALL (*)    Ketones, ur >80 (*)    Protein, ur 100 (*)    All other components within normal limits  URINE MICROSCOPIC-ADD ON - Abnormal; Notable for the following:    Squamous Epithelial / LPF 6-30 (*)    Bacteria, UA MANY (*)    All other components within normal limits  CBC - Abnormal; Notable for the following:    WBC 11.9 (*)    All other components within normal limits  COMPREHENSIVE METABOLIC PANEL - Abnormal; Notable for the following:    ALT 12 (*)    All other components within normal limits  LIPASE, BLOOD  AMYLASE   No results found.   1. Hyperemesis gravidarum     Course 2:00 pm Patient no longer vomiting, attempting to take PO fluids 2:50 pm Patient tolerating PO fluids, reports doing well and no longer nauseous   MDM   Assessment: Marie Holt is a 25 yo female, G3P2002 currently 2139w6d pregnant, who presents with nausea, vomiting, and 11% weight loss concerning for hyperemesis in pregnancy.   Plan: Hyperemesis: -IV fluids D5LR 1L bolus, multivitamin LR 1L bolus in MAU -IV phenergan 25 mg in MAU -IV zofran in MAU -PO zofran tablets prescribe for home -scopolamine patch prescribe for home -Discharge home

## 2016-06-23 NOTE — MAU Note (Signed)
Patient presents unable to keep anything down.

## 2016-06-23 NOTE — MAU Provider Note (Signed)
Chief Complaint: Hyperemesis Gravidarum   First Provider Initiated Contact with Patient 06/23/16 1053      SUBJECTIVE HPI: Marie Holt is a 25 y.o. G3P2002 at [redacted]w[redacted]d by LMP who presents to maternity admissions reporting increased n/v in last 2 days, unable to keep anything down. She reports hyperemesis this pregnancy and tried taking her medications at home but they did not help. She placed a phenergan suppository yesterday but has not tried one today. This is not a new symptom as she has hyperemesis of pregnancy, but symptoms are worsening since onset, with current episode lasting 2 days.   She denies abdominal pain, vaginal bleeding, vaginal itching/burning, urinary symptoms, h/a, dizziness, n/v, or fever/chills.    Pt reports weight loss of 10-12 lbs in this pregnancy.  HPI  Past Medical History:  Diagnosis Date  . Anxiety   . Asthma   . 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 Right ureteral stone   . Yeast infection    Past Surgical History:  Procedure Laterality Date  . CYSTOSCOPY W/ RETROGRADES  06/03/2012   Procedure: CYSTOSCOPY WITH RETROGRADE PYELOGRAM;  Surgeon: Valetta Fuller, MD;  Location: [facility];  Service: Urology;  Laterality: Right;   Social History   Social History  . Marital status: Single    Spouse name: N/A  . Number of children: N/A  . Years of education: N/A   Occupational History  . Not on file.   Social History Main Topics  . Smoking status: Never Smoker  . Smokeless tobacco: Never Used  . Alcohol use No     Comment: occasional  . Drug use: No     Comment: denies any usuage  . Sexual activity: Yes    Birth control/ protection: None   Other Topics Concern  . Not on file   Social History Narrative  . No narrative on file   No current facility-administered medications on file prior to encounter.    Current Outpatient Prescriptions on File Prior to Encounter  Medication Sig Dispense  Refill  . azithromycin (ZITHROMAX) 500 MG tablet Take two tablets by mouth once (Patient not taking: Reported on 06/09/2016) 2 tablet 0  . methylPREDNISolone (MEDROL DOSEPAK) 4 MG TBPK tablet Day 1: 2 tablets before breakfast, 1 after lunch, 1 after dinner, and 2 at bedtime.  Day 2: 1 tablet before breakfast, 1 after lunch, 1 after dinner, and 2 at bedtime Day 3: 1 tablet before breakfast, 1 after lunch, 1 after dinner, and 1at bedtime Day 4: 1tablet before breakfast, 1 after lunch, and 1 at bedtime Day 5: 1 tablet before breakfast and 1 at bedtime Day 6: 1 tablet before breakfast 21 tablet 0  . metoCLOPramide (REGLAN) 10 MG tablet Take 1 tablet (10 mg total) by mouth 3 (three) times daily before meals. 60 tablet 1  . promethazine (PHENERGAN) 12.5 MG tablet Take 12.5 mg by mouth every 6 (six) hours as needed for nausea or vomiting.   1  . ranitidine (ZANTAC) 150 MG tablet Take 1 tablet (150 mg total) by mouth 2 (two) times daily. 60 tablet 1   Allergies  Allergen Reactions  . Vicodin [Hydrocodone-Acetaminophen] Nausea And Vomiting    ROS:  Review of Systems  Constitutional: Negative for chills, fatigue and fever.  Respiratory: Negative for shortness of breath.   Cardiovascular: Negative for chest pain.  Gastrointestinal: Positive for nausea and vomiting. Negative for abdominal pain.  Genitourinary: Negative for difficulty urinating,  dysuria, flank pain, pelvic pain, vaginal bleeding, vaginal discharge and vaginal pain.  Neurological: Negative for dizziness and headaches.  Psychiatric/Behavioral: Negative.      I have reviewed patient's Past Medical Hx, Surgical Hx, Family Hx, Social Hx, medications and allergies.   Physical Exam  Patient Vitals for the past 24 hrs:  BP Temp Temp src Pulse Resp Height Weight  06/23/16 1002 - - - - - 5\' 2"  (1.575 m) 112 lb 1.3 oz (50.8 kg)  06/23/16 0954 109/75 98.2 F (36.8 C) Oral 96 16 - -   Constitutional: Well-developed, well-nourished female  in no acute distress.  Cardiovascular: normal rate Respiratory: normal effort GI: Abd soft, non-tender. Pos BS x 4 MS: Extremities nontender, no edema, normal ROM Neurologic: Alert and oriented x 4.  GU: Neg CVAT.  PELVIC EXAM: Cervix pink, visually closed, without lesion, scant white creamy discharge, vaginal walls and external genitalia normal Bimanual exam: Cervix 0/long/high, firm, anterior, neg CMT, uterus nontender, nonenlarged, adnexa without tenderness, enlargement, or mass  FHT 169 by doppler  Pt does have documented weight loss of ~12% body weight, from 126lbs on 8/3 to 112lbs today.  LAB RESULTS  Results for orders placed or performed during the hospital encounter of 06/23/16 (from the past 336 hour(s))  Urinalysis, Routine w reflex microscopic (not at [facility])   Collection Time: 06/23/16  9:10 AM  Result Value Ref Range   Color, Urine YELLOW YELLOW   APPearance CLEAR CLEAR   Specific Gravity, Urine >1.030 (H) 1.005 - 1.030   pH 6.0 5.0 - 8.0   Glucose, UA NEGATIVE NEGATIVE mg/dL   Hgb urine dipstick TRACE (A) NEGATIVE   Bilirubin Urine SMALL (A) NEGATIVE   Ketones, ur >80 (A) NEGATIVE mg/dL   Protein, ur 409 (A) NEGATIVE mg/dL   Nitrite NEGATIVE NEGATIVE   Leukocytes, UA NEGATIVE NEGATIVE  Urine microscopic-add on   Collection Time: 06/23/16  9:10 AM  Result Value Ref Range   Squamous Epithelial / LPF 6-30 (A) NONE SEEN   WBC, UA 0-5 0 - 5 WBC/hpf   RBC / HPF 0-5 0 - 5 RBC/hpf   Bacteria, UA MANY (A) NONE SEEN   Urine-Other MUCOUS PRESENT   CBC   Collection Time: 06/23/16 11:00 AM  Result Value Ref Range   WBC 11.9 (H) 4.0 - 10.5 K/uL   RBC 4.45 3.87 - 5.11 MIL/uL   Hemoglobin 14.0 12.0 - 15.0 g/dL   HCT 81.1 91.4 - 78.2 %   MCV 89.7 78.0 - 100.0 fL   MCH 31.5 26.0 - 34.0 pg   MCHC 35.1 30.0 - 36.0 g/dL   RDW 95.6 21.3 - 08.6 %   Platelets 227 150 - 400 K/uL  Comprehensive metabolic panel   Collection Time: 06/23/16 11:00 AM  Result Value Ref Range    Sodium 138 135 - 145 mmol/L   Potassium 3.7 3.5 - 5.1 mmol/L   Chloride 103 101 - 111 mmol/L   CO2 26 22 - 32 mmol/L   Glucose, Bld 80 65 - 99 mg/dL   BUN 12 6 - 20 mg/dL   Creatinine, Ser 5.78 0.44 - 1.00 mg/dL   Calcium 46.9 8.9 - 62.9 mg/dL   Total Protein 7.5 6.5 - 8.1 g/dL   Albumin 4.4 3.5 - 5.0 g/dL   AST 16 15 - 41 U/L   ALT 12 (L) 14 - 54 U/L   Alkaline Phosphatase 49 38 - 126 U/L   Total Bilirubin 1.0 0.3 - 1.2 mg/dL  GFR calc non Af Amer >60 >60 mL/min   GFR calc Af Amer >60 >60 mL/min   Anion gap 9 5 - 15  Lipase, blood   Collection Time: 06/23/16 11:00 AM  Result Value Ref Range   Lipase 26 11 - 51 U/L  Amylase   Collection Time: 06/23/16 11:00 AM  Result Value Ref Range   Amylase 70 28 - 100 U/L       IMAGING [place] Ob Transvaginal  Result Date: 05/25/2016 CLINICAL DATA:  Excessive vomiting since yesterday morning, pain, early pregnancy. EXAM: TRANSVAGINAL OB ULTRASOUND TECHNIQUE: Transvaginal ultrasound was performed for complete evaluation of the gestation as well as the maternal uterus, adnexal regions, and pelvic cul-de-sac. COMPARISON:  None. FINDINGS: Intrauterine gestational sac: Single intrauterine gestational sac with appropriate surrounding decidual reaction. Yolk sac:  Normal Embryo:  Present Cardiac Activity: Present Heart Rate: 142 bpm MSD:   mm    w     d CRL:   9  mm   6 w 5 d                  US EDC: 01/13/2017 Subchorionic hemorrhage: Questionable small subchorionic bleed, but not convincing. Maternal uterus/adnexae: Maternal ovaries are unremarkable with expected corpus luteum in the right ovary. No mass or free fluid identified within either adnexal region. No mass or free fluid seen in the cul-de-sac. IMPRESSION: 1. Single live intrauterine pregnancy with estimated gestational age of [redacted] weeks and 5 days. Fetal heart rate of 142 beats per minute. 2. No abnormality identified. Questionable small subchorionic bleed, but not convincing. Electronically Signed    By: Bary RichardStan  Maynard M.D.   On: 05/25/2016 14:42    MAU Management/MDM: Ordered labs and reviewed results.  Pt with known hyperemesis with documented 12% weight loss in first trimester.  Pt only taking Phenergan at home, no other medications or treatments.  Has done steroid taper with some success previously, but is not currently on this regimen. Consult Dr Alysia PennaErvin.   D5LR x 1000 l,  multivitamin IV x 1000 ml, Scopolamine patch placed, and Zofran 4 mg IV given in MAU.  Pt tolerated some PO food/fluids after treatment.  D/C home.  Plan to start Zofran 4 mg PO Q 8 hours since pt in second trimester and leave scopolamine patch on for 72 hours.  D/C Phenergan and Reglan if pt prefers patch therapy and Rx for scopolamine patches sent to pharmacy.  Discussed reasons to return to hospital.  [facility].  Pt stable at time of discharge.  ASSESSMENT  1. Hyperemesis gravidarum    PLAN Discharge home   Medication List    STOP taking these medications   metoCLOPramide 10 MG tablet Commonly known as:  REGLAN   promethazine 12.5 MG tablet Commonly known as:  PHENERGAN     TAKE these medications   methylPREDNISolone 4 MG Tbpk tablet Commonly known as:  MEDROL DOSEPAK Day 1: 2 tablets before breakfast, 1 after lunch, 1 after dinner, and 2 at bedtime.  Day 2: 1 tablet before breakfast, 1 after lunch, 1 after dinner, and 2 at bedtime Day 3: 1 tablet before breakfast, 1 after lunch, 1 after dinner, and 1at bedtime Day 4: 1tablet before breakfast, 1 after lunch, and 1 at bedtime Day 5: 1 tablet before breakfast and 1 at bedtime Day 6: 1 tablet before breakfast   ondansetron 4 MG tablet Commonly known as:  ZOFRAN Take 1 tablet (4 mg total) by  mouth every 8 (eight) hours as needed for nausea or vomiting.   ranitidine 150 MG tablet Commonly known as:  ZANTAC Take 1 tablet (150 mg total) by mouth 2 (two) times daily.   scopolamine 1 MG/3DAYS Commonly known as:  TRANSDERM-SCOP (1.5  MG) Place 1 patch (1.5 mg total) onto the skin every 3 (three) days.   scopolamine 1 MG/3DAYS Commonly known as:  TRANSDERM-SCOP Place 1 patch (1.5 mg total) onto the skin every 3 (three) days.        Follow-up Information    [facility] .   Contact information: [address] [place] [address] [phone]           Sharen Counter Certified Nurse-Midwife 06/23/2016  10:55 AM

## 2016-07-25 ENCOUNTER — Other Ambulatory Visit (HOSPITAL_COMMUNITY)
Admission: RE | Admit: 2016-07-25 | Discharge: 2016-07-25 | Disposition: A | Discharge status: Home / Self Care | Payer: Medicaid Other | Source: Ambulatory Visit | Attending: Certified Nurse Midwife | Admitting: Certified Nurse Midwife

## 2016-07-25 ENCOUNTER — Ambulatory Visit (INDEPENDENT_AMBULATORY_CARE_PROVIDER_SITE_OTHER): Payer: Medicaid Other | Admitting: Certified Nurse Midwife

## 2016-07-25 VITALS — BP 103/73 | HR 85 | Wt 117.0 lb

## 2016-07-25 DIAGNOSIS — N760 Acute vaginitis: Secondary | ICD-10-CM | POA: Diagnosis present

## 2016-07-25 DIAGNOSIS — Z01419 Encounter for gynecological examination (general) (routine) without abnormal findings: Secondary | ICD-10-CM | POA: Insufficient documentation

## 2016-07-25 DIAGNOSIS — Z113 Encounter for screening for infections with a predominantly sexual mode of transmission: Secondary | ICD-10-CM | POA: Insufficient documentation

## 2016-07-25 DIAGNOSIS — K117 Disturbances of salivary secretion: Secondary | ICD-10-CM | POA: Diagnosis not present

## 2016-07-25 DIAGNOSIS — O0932 Supervision of pregnancy with insufficient antenatal care, second trimester: Secondary | ICD-10-CM

## 2016-07-25 DIAGNOSIS — Z3482 Encounter for supervision of other normal pregnancy, second trimester: Secondary | ICD-10-CM | POA: Diagnosis not present

## 2016-07-25 DIAGNOSIS — O219 Vomiting of pregnancy, unspecified: Secondary | ICD-10-CM | POA: Diagnosis not present

## 2016-07-25 DIAGNOSIS — Z3480 Encounter for supervision of other normal pregnancy, unspecified trimester: Secondary | ICD-10-CM | POA: Insufficient documentation

## 2016-07-25 MED ORDER — GLYCOPYRROLATE 1 MG PO TABS: 1.0000 mg | ORAL_TABLET | Freq: Three times a day (TID) | ORAL | 5 refills | Status: DC

## 2016-07-25 MED ORDER — ONDANSETRON HCL 8 MG PO TABS: 8.0000 mg | ORAL_TABLET | Freq: Three times a day (TID) | ORAL | 2 refills | Status: DC | PRN

## 2016-07-25 MED ORDER — PRENATE PIXIE 10-0.6-0.4-200 MG PO CAPS: 1.0000 | ORAL_CAPSULE | Freq: Every day | ORAL | 12 refills | Status: DC

## 2016-07-25 NOTE — Patient Instructions (Addendum)
Morning Sickness Morning sickness is when you feel sick to your stomach (nauseous) during pregnancy. This nauseous feeling may or may not come with vomiting. It often occurs in the morning but can be a problem any time of day. Morning sickness is most common during the first trimester, but it may continue throughout pregnancy. While morning sickness is unpleasant, it is usually harmless unless you develop severe and continual vomiting (hyperemesis gravidarum). This condition requires more intense treatment.  CAUSES  The cause of morning sickness is not completely known but seems to be related to normal hormonal changes that occur in pregnancy. RISK FACTORS You are at greater risk if you:  Experienced nausea or vomiting before your pregnancy.  Had morning sickness during a previous pregnancy.  Are pregnant with more than one baby, such as twins. TREATMENT  Do not use any medicines (prescription, over-the-counter, or herbal) for morning sickness without first talking to your health care provider. Your health care provider may prescribe or recommend:  Vitamin B6 supplements.  Anti-nausea medicines.  The herbal medicine ginger. HOME CARE INSTRUCTIONS   Only take over-the-counter or prescription medicines as directed by your health care provider.  Taking multivitamins before getting pregnant can prevent or decrease the severity of morning sickness in most women.  Eat a piece of dry toast or unsalted crackers before getting out of bed in the morning.  Eat five or six small meals a day.  Eat dry and bland foods (rice, baked potato). Foods high in carbohydrates are often helpful.  Do not drink liquids with your meals. Drink liquids between meals.  Avoid greasy, fatty, and spicy foods.  Get someone to cook for you if the smell of any food causes nausea and vomiting.  If you feel nauseous after taking prenatal vitamins, take the vitamins at night or with a snack.  Snack on protein  foods (nuts, yogurt, cheese) between meals if you are hungry.  Eat unsweetened gelatins for desserts.  Wearing an acupressure wristband (worn for sea sickness) may be helpful.  Acupuncture may be helpful.  Do not smoke.  Get a humidifier to keep the air in your house free of odors.  Get plenty of fresh air. SEEK MEDICAL CARE IF:   Your home remedies are not working, and you need medicine.  You feel dizzy or lightheaded.  You are losing weight. SEEK IMMEDIATE MEDICAL CARE IF:   You have persistent and uncontrolled nausea and vomiting.  You pass out (faint). MAKE SURE YOU:  Understand these instructions.  Will watch your condition.  Will get help right away if you are not doing well or get worse.   This information is not intended to replace advice given to you by your health care provider. Make sure you discuss any questions you have with your health care provider.   Document Released: 11/16/2006 Document Revised: 09/30/2013 Document Reviewed: 03/12/2013 Elsevier Interactive Patient Education 2016 [employer]    Second Trimester of Pregnancy The second trimester is from week 13 through week 28, months 4 through 6. The second trimester is often a time when you feel your best. Your body has also adjusted to being pregnant, and you begin to feel better physically. Usually, morning sickness has lessened or quit completely, you may have more energy, and you may have an increase in appetite. The second trimester is also a time when the fetus is growing rapidly. At the end of the sixth month, the fetus is about 9 inches long and weighs about 1   pounds. You will likely begin to feel the baby move (quickening) between 18 and 20 weeks of the pregnancy. BODY CHANGES Your body goes through many changes during pregnancy. The changes vary from woman to woman.   Your weight will continue to increase. You will notice your lower abdomen bulging out.  You may begin to get stretch marks  on your hips, abdomen, and breasts.  You may develop headaches that can be relieved by medicines approved by your health care provider.  You may urinate more often because the fetus is pressing on your bladder.  You may develop or continue to have heartburn as a result of your pregnancy.  You may develop constipation because certain hormones are causing the muscles that push waste through your intestines to slow down.  You may develop hemorrhoids or swollen, bulging veins (varicose veins).  You may have back pain because of the weight gain and pregnancy hormones relaxing your joints between the bones in your pelvis and as a result of a shift in weight and the muscles that support your balance.  Your breasts will continue to grow and be tender.  Your gums may bleed and may be sensitive to brushing and flossing.  Dark spots or blotches (chloasma, mask of pregnancy) may develop on your face. This will likely fade after the baby is born.  A dark line from your belly button to the pubic area (linea nigra) may appear. This will likely fade after the baby is born.  You may have changes in your hair. These can include thickening of your hair, rapid growth, and changes in texture. Some women also have hair loss during or after pregnancy, or hair that feels dry or thin. Your hair will most likely return to normal after your baby is born. WHAT TO EXPECT AT YOUR PRENATAL VISITS During a routine prenatal visit:  You will be weighed to make sure you and the fetus are growing normally.  Your blood pressure will be taken.  Your abdomen will be measured to track your baby's growth.  The fetal heartbeat will be listened to.  Any test results from the previous visit will be discussed. Your health care provider may ask you:  How you are feeling.  If you are feeling the baby move.  If you have had any abnormal symptoms, such as leaking fluid, bleeding, severe headaches, or abdominal  cramping.  If you are using any tobacco products, including cigarettes, chewing tobacco, and electronic cigarettes.  If you have any questions. Other tests that may be performed during your second trimester include:  Blood tests that check for:  Low iron levels (anemia).  Gestational diabetes (between 24 and 28 weeks).  Rh antibodies.  Urine tests to check for infections, diabetes, or protein in the urine.  An ultrasound to confirm the proper growth and development of the baby.  An amniocentesis to check for possible genetic problems.  Fetal screens for spina bifida and Down syndrome.  HIV (human immunodeficiency virus) testing. Routine prenatal testing includes screening for HIV, unless you choose not to have this test. HOME CARE INSTRUCTIONS   Avoid all smoking, herbs, alcohol, and unprescribed drugs. These chemicals affect the formation and growth of the baby.  Do not use any tobacco products, including cigarettes, chewing tobacco, and electronic cigarettes. If you need help quitting, ask your health care provider. You may receive counseling support and other resources to help you quit.  Follow your health care provider's instructions regarding medicine use. There are medicines   that are either safe or unsafe to take during pregnancy.  Exercise only as directed by your health care provider. Experiencing uterine cramps is a good sign to stop exercising.  Continue to eat regular, healthy meals.  Wear a good support bra for breast tenderness.  Do not use hot tubs, steam rooms, or saunas.  Wear your seat belt at all times when driving.  Avoid raw meat, uncooked cheese, cat litter boxes, and soil used by cats. These carry germs that can cause birth defects in the baby.  Take your prenatal vitamins.  Take 1500-2000 mg of calcium daily starting at the 20th week of pregnancy until you deliver your baby.  Try taking a stool softener (if your health care provider approves) if  you develop constipation. Eat more high-fiber foods, such as fresh vegetables or fruit and whole grains. Drink plenty of fluids to keep your urine clear or pale yellow.  Take warm sitz baths to soothe any pain or discomfort caused by hemorrhoids. Use hemorrhoid cream if your health care provider approves.  If you develop varicose veins, wear support hose. Elevate your feet for 15 minutes, 3-4 times a day. Limit salt in your diet.  Avoid heavy lifting, wear low heel shoes, and practice good posture.  Rest with your legs elevated if you have leg cramps or low back pain.  Visit your dentist if you have not gone yet during your pregnancy. Use a soft toothbrush to brush your teeth and be gentle when you floss.  A sexual relationship may be continued unless your health care provider directs you otherwise.  Continue to go to all your prenatal visits as directed by your health care provider. SEEK MEDICAL CARE IF:   You have dizziness.  You have mild pelvic cramps, pelvic pressure, or nagging pain in the abdominal area.  You have persistent nausea, vomiting, or diarrhea.  You have a bad smelling vaginal discharge.  You have pain with urination. SEEK IMMEDIATE MEDICAL CARE IF:   You have a fever.  You are leaking fluid from your vagina.  You have spotting or bleeding from your vagina.  You have severe abdominal cramping or pain.  You have rapid weight gain or loss.  You have shortness of breath with chest pain.  You notice sudden or extreme swelling of your face, hands, ankles, feet, or legs.  You have not felt your baby move in over an hour.  You have severe headaches that do not go away with medicine.  You have vision changes.   This information is not intended to replace advice given to you by your health care provider. Make sure you discuss any questions you have with your health care provider.   Document Released: 09/19/2001 Document Revised: 10/16/2014 Document Reviewed:  11/26/2012 Elsevier Interactive Patient Education 2016 [employer] Inc.   

## 2016-07-25 NOTE — Progress Notes (Signed)
Subjective:    Marie Holt is being seen today for her first obstetrical visit.  This is not a planned pregnancy. She is at [redacted]w[redacted]d gestation. Her obstetrical history is significant for none. Relationship with FOB: significant other, living together. Patient does not intend to breast feed. Pregnancy history fully reviewed.  The information documented in the HPI was reviewed and verified.  Menstrual History: OB History    Gravida Para Term Preterm AB Living   3 2 2     2    SAB TAB Ectopic Multiple Live Births           2      Menarche age: 25 years of age Patient's last menstrual period was 02/09/2016 (approximate).    Past Medical History:  Diagnosis Date  . Anxiety   . Asthma   . 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 Right ureteral stone   . Yeast infection     Past Surgical History:  Procedure Laterality Date  . CYSTOSCOPY W/ RETROGRADES  06/03/2012   Procedure: CYSTOSCOPY WITH RETROGRADE PYELOGRAM;  Surgeon: Valetta Fuller, MD;  Location: [facility];  Service: Urology;  Laterality: Right;     (Not in a hospital admission) Allergies  Allergen Reactions  . Vicodin [Hydrocodone-Acetaminophen] Nausea And Vomiting    Social History  Substance Use Topics  . Smoking status: Never Smoker  . Smokeless tobacco: Never Used  . Alcohol use No     Comment: occasional    Family History  Problem Relation Age of Onset  . Hypertension Mother   . Cancer Father     lung  . Cancer Paternal Aunt     breast  . Von Willebrand disease Sister   . Hypertension Maternal Grandmother   . Cancer Paternal Grandmother     unknown origin  . Cancer Paternal Grandfather     unknown origin  . Anesthesia problems Neg Hx      Review of Systems Constitutional: negative for weight loss Gastrointestinal: negative for vomiting Genitourinary:negative for genital lesions and vaginal discharge and dysuria Musculoskeletal:negative for back  pain Behavioral/Psych: negative for abusive relationship, depression, illegal drug usage and tobacco use    Objective:    BP 103/73   Pulse 85   Wt 117 lb (53.1 kg)   LMP 02/09/2016 (Approximate) Comment: doesn't come every month  BMI 21.40 kg/m  General Appearance:    Alert, cooperative, no distress, appears stated age  Head:    Normocephalic, without obvious abnormality, atraumatic  Eyes:    PERRL, conjunctiva/corneas clear, EOM's intact, fundi    benign, both eyes  Ears:    Normal TM's and external ear canals, both ears  Nose:   Nares normal, septum midline, mucosa normal, no drainage    or sinus tenderness  Throat:   Lips, mucosa, and tongue normal; teeth and gums normal  Neck:   Supple, symmetrical, trachea midline, no adenopathy;    thyroid:  no enlargement/tenderness/nodules; no carotid   bruit or JVD  Back:     Symmetric, no curvature, ROM normal, no CVA tenderness  Lungs:     Clear to auscultation bilaterally, respirations unlabored  Chest Wall:    No tenderness or deformity   Heart:    Regular rate and rhythm, S1 and S2 normal, no murmur, rub   or gallop  Breast Exam:    No tenderness, masses, or nipple abnormality  Abdomen:     Soft, non-tender, bowel  sounds active all four quadrants,    no masses, no organomegaly  Genitalia:    Normal female without lesion, discharge or tenderness  Extremities:   Extremities normal, atraumatic, no cyanosis or edema  Pulses:   2+ and symmetric all extremities  Skin:   Skin color, texture, turgor normal, no rashes or lesions  Lymph nodes:   Cervical, supraclavicular, and axillary nodes normal  Neurologic:   CNII-XII intact, normal strength, sensation and reflexes    throughout            Cervix:  Long, thick, closed and posterior.  FHR: 136 by doppler.  FH: less than U.    Lab Review Urine pregnancy test Labs reviewed yes Radiologic studies reviewed no Assessment:    Pregnancy at [redacted]w[redacted]d weeks   Late prenatal care  Supervision  of other normal pregnancy  H/O hyperemesis grav idium this pregnancy  H/O renal stone  Spitting  Plan:      Prenatal vitamins.  Counseling provided regarding continued use of seat belts, cessation of alcohol consumption, smoking or use of illicit drugs; infection precautions i.e., influenza/TDAP immunizations, toxoplasmosis,CMV, parvovirus, listeria and varicella; workplace safety, exercise during pregnancy; routine dental care, safe medications, sexual activity, hot tubs, saunas, pools, travel, caffeine use, fish and methlymercury, potential toxins, hair treatments, varicose veins Weight gain recommendations per IOM guidelines reviewed: underweight/BMI< 18.5--> gain 28 - 40 lbs; normal weight/BMI 18.5 - 24.9--> gain 25 - 35 lbs; overweight/BMI 25 - 29.9--> gain 15 - 25 lbs; obese/BMI >30->gain  11 - 20 lbs Problem list reviewed and updated. FIRST/CF mutation testing/NIPT/QUAD SCREEN/fragile X/Ashkenazi Jewish population testing/Spinal muscular atrophy discussed: ordered. Role of ultrasound in pregnancy discussed; fetal survey: ordered. Amniocentesis discussed: not indicated. VBAC calculator score: VBAC consent form provided Meds ordered this encounter  Medications  . ondansetron (ZOFRAN) 8 MG tablet    Sig: Take 1 tablet (8 mg total) by mouth every 8 (eight) hours as needed for nausea or vomiting.    Dispense:  40 tablet    Refill:  2  . glycopyrrolate (ROBINUL) 1 MG tablet    Sig: Take 1 tablet (1 mg total) by mouth 3 (three) times daily.    Dispense:  90 tablet    Refill:  5  . Prenat-FeAsp-Meth-FA-DHA w/o A (PRENATE PIXIE) 10-0.6-0.4-200 MG CAPS    Sig: Take 1 tablet by mouth at bedtime.    Dispense:  30 capsule    Refill:  12    Please process coupon: Rx BIN: [id number], RxPCN: [id number], RxGRP: [id number], RxID: [id number]  SUF: 01   Orders Placed This Encounter  Procedures  . Culture, OB Urine  . US OB Comp + 14 Wk    Standing Status:   Future    Standing Expiration Date:    09/24/2017    Order Specific Question:   Reason for Exam (SYMPTOM  OR DIAGNOSIS REQUIRED)    Answer:   dating, fetal anatomy scan    Order Specific Question:   Preferred imaging location?    Answer:   Internal  . TSH  . HIV antibody  . Hemoglobinopathy evaluation  . Varicella zoster antibody, IgG  . Prenatal Profile I  . MaterniT21 PLUS Core+SCA    Order Specific Question:   Is the patient insulin dependent?    Answer:   No    Order Specific Question:   Please enter gestational age. This should be expressed as weeks AND days, i.e. 16w 6d. Enter weeks here. Enter days in next question.  Answer:   7515    Order Specific Question:   Please enter gestational age. This should be expressed as weeks AND days, i.e. 16w 6d. Enter days here. Enter weeks in previous question.    Answer:   3    Order Specific Question:   How was gestational age calculated?    Answer:   LMP    Order Specific Question:   Please give the date of LMP OR Ultrasound OR Estimated date of delivery.    Answer:   01/13/2017    Order Specific Question:   Number of Fetuses (Type of Pregnancy):    Answer:   1    Order Specific Question:   Indications for performing the test? (please choose all that apply):    Answer:   Routine screening    Order Specific Question:   Other Indications? (Y=Yes, N=No)    Answer:   N    Order Specific Question:   If this is a repeat specimen, please indicate the reason:    Answer:   Not indicated    Order Specific Question:   Please specify the patient's race: (C=White/Caucasion, B=Black, I=Native American, A=Asian, H=Hispanic, O=Other, U=Unknown)    Answer:   C    Order Specific Question:   Donor Egg - indicate if the egg was obtained from in vitro fertilization.    Answer:   N    Order Specific Question:   Age of Egg Donor.    Answer:   [age]    Order Specific Question:   Prior Down Syndrome/ONTD screening during current pregnancy.    Answer:   N    Order Specific Question:   Prior First  Trimester Testing    Answer:   N    Order Specific Question:   Prior Second Trimester Testing    Answer:   N    Order Specific Question:   Family History of Neural Tube Defects    Answer:   N    Order Specific Question:   Prior Pregnancy with Down Syndrome    Answer:   N    Order Specific Question:   Please give the patient's weight (in pounds)    Answer:   120  . ToxASSURE Select 13 (MW), Urine  . Hemoglobin A1c    Follow up in 4 weeks. 50% of 30 min visit spent on counseling and coordination of care.

## 2016-07-26 LAB — CERVICOVAGINAL ANCILLARY ONLY
CHLAMYDIA, DNA PROBE: NEGATIVE
NEISSERIA GONORRHEA: NEGATIVE
TRICH (WINDOWPATH): NEGATIVE

## 2016-07-27 LAB — CYTOLOGY - PAP: Diagnosis: NEGATIVE

## 2016-07-27 LAB — URINE CULTURE, OB REFLEX

## 2016-07-27 LAB — CULTURE, OB URINE

## 2016-07-28 LAB — CERVICOVAGINAL ANCILLARY ONLY
BACTERIAL VAGINITIS: NEGATIVE
CANDIDA VAGINITIS: NEGATIVE

## 2016-08-04 ENCOUNTER — Other Ambulatory Visit: Payer: Self-pay | Admitting: Certified Nurse Midwife

## 2016-08-04 LAB — PRENATAL PROFILE I(LABCORP)
ANTIBODY SCREEN: NEGATIVE
BASOS ABS: 0 10*3/uL (ref 0.0–0.2)
Basos: 0 %
EOS (ABSOLUTE): 0 10*3/uL (ref 0.0–0.4)
EOS: 0 %
HEMOGLOBIN: 13 g/dL (ref 11.1–15.9)
HEP B S AG: NEGATIVE
Hematocrit: 38.6 % (ref 34.0–46.6)
IMMATURE GRANS (ABS): 0 10*3/uL (ref 0.0–0.1)
IMMATURE GRANULOCYTES: 0 %
LYMPHS: 22 %
Lymphocytes Absolute: 2.2 10*3/uL (ref 0.7–3.1)
MCH: 31.3 pg (ref 26.6–33.0)
MCHC: 33.7 g/dL (ref 31.5–35.7)
MCV: 93 fL (ref 79–97)
Monocytes Absolute: 0.5 10*3/uL (ref 0.1–0.9)
Monocytes: 5 %
NEUTROS PCT: 73 %
Neutrophils Absolute: 7.2 10*3/uL — ABNORMAL HIGH (ref 1.4–7.0)
PLATELETS: 256 10*3/uL (ref 150–379)
RBC: 4.16 x10E6/uL (ref 3.77–5.28)
RDW: 13.9 % (ref 12.3–15.4)
RH TYPE: POSITIVE
RPR Ser Ql: NONREACTIVE
RUBELLA: 1.53 {index} (ref >0.99)
WBC: 10 10*3/uL (ref 3.4–10.8)

## 2016-08-04 LAB — HEMOGLOBIN A1C
ESTIMATED AVERAGE GLUCOSE: 100 mg/dL
HEMOGLOBIN A1C: 5.1 % (ref 4.8–5.6)

## 2016-08-04 LAB — MATERNIT21 PLUS CORE+SCA
CHROMOSOME 13: NEGATIVE
Chromosome 18: NEGATIVE
Chromosome 21: NEGATIVE
PDF: 0
Y CHROMOSOME: DETECTED

## 2016-08-04 LAB — HEMOGLOBINOPATHY EVALUATION
HEMOGLOBIN A2 QUANTITATION: 2.8 % (ref 0.7–3.1)
HGB C: 0 %
HGB S: 0 %
Hemoglobin F Quantitation: 0 % (ref 0.0–2.0)
Hgb A: 97.2 % (ref 94.0–98.0)

## 2016-08-04 LAB — TSH: TSH: 1.49 u[IU]/mL (ref 0.450–4.500)

## 2016-08-04 LAB — VARICELLA ZOSTER ANTIBODY, IGG: VARICELLA: 195 {index} (ref >165)

## 2016-08-04 LAB — TOXASSURE SELECT 13 (MW), URINE

## 2016-08-04 LAB — HIV ANTIBODY (ROUTINE TESTING W REFLEX): HIV Screen 4th Generation wRfx: NONREACTIVE

## 2016-08-17 ENCOUNTER — Ambulatory Visit (INDEPENDENT_AMBULATORY_CARE_PROVIDER_SITE_OTHER): Payer: Medicaid Other

## 2016-08-17 ENCOUNTER — Ambulatory Visit (INDEPENDENT_AMBULATORY_CARE_PROVIDER_SITE_OTHER): Payer: Medicaid Other | Admitting: Certified Nurse Midwife

## 2016-08-17 VITALS — BP 98/64 | HR 106 | Wt 121.0 lb

## 2016-08-17 DIAGNOSIS — K219 Gastro-esophageal reflux disease without esophagitis: Secondary | ICD-10-CM | POA: Insufficient documentation

## 2016-08-17 DIAGNOSIS — Z3482 Encounter for supervision of other normal pregnancy, second trimester: Secondary | ICD-10-CM | POA: Diagnosis not present

## 2016-08-17 DIAGNOSIS — Z3480 Encounter for supervision of other normal pregnancy, unspecified trimester: Secondary | ICD-10-CM

## 2016-08-17 DIAGNOSIS — O99612 Diseases of the digestive system complicating pregnancy, second trimester: Secondary | ICD-10-CM

## 2016-08-17 MED ORDER — OMEPRAZOLE 20 MG PO CPDR: 20.0000 mg | DELAYED_RELEASE_CAPSULE | Freq: Two times a day (BID) | ORAL | 5 refills | Status: DC

## 2016-08-17 NOTE — Progress Notes (Addendum)
  Subjective:    Coralyn MarkBrandy D Ehrman is a 25 y.o. female being seen today for her obstetrical visit. She is at 6161w5d gestation. Patient reports: no bleeding, no contractions, no cramping, no leaking and spitting.  Has tried tums for GERD, desires to try another option.  GERD diet discussed.   Problem List Items Addressed This Visit      Digestive   Gastroesophageal reflux during pregnancy in second trimester, antepartum   Relevant Medications   omeprazole (PRILOSEC) 20 MG capsule     Other   Supervision of other normal pregnancy, antepartum    Other Visit Diagnoses    Gastroesophageal reflux disease, esophagitis presence not specified    -  Primary   Relevant Medications   omeprazole (PRILOSEC) 20 MG capsule     Patient Active Problem List   Diagnosis Date Noted  . Gastroesophageal reflux during pregnancy in second trimester, antepartum 08/17/2016  . Supervision of other normal pregnancy, antepartum 07/25/2016  . Late prenatal care affecting pregnancy in second trimester 07/25/2016  . Mass on back 01/12/2014  . Kidney stone 04/18/2012  . Asthma 01/29/2012  . Poor dentition 01/29/2012  . Irregular menstrual cycle 01/29/2012    Objective:     BP 98/64   Pulse (!) 106   Wt 121 lb (54.9 kg)   LMP 02/09/2016 (Approximate) Comment: doesn't come every month  BMI 22.13 kg/m  Uterine Size: Below umbilicus   FHR: 141.    Assessment:    Pregnancy @ 7061w5d  weeks Doing well    Spitting: hypersalvation   GERD  Plan:   Has tried Rubinol, lemon water, nothing works for the spitting  Problem list reviewed and updated. Labs reviewed.  Follow up in 4 weeks. FIRST/CF mutation testing/NIPT/QUAD SCREEN/fragile X/Ashkenazi Jewish population testing/Spinal muscular atrophy discussed: results reviewed. Role of ultrasound in pregnancy discussed; fetal survey: ordered. Amniocentesis discussed: not indicated. 50% of 15 minute visit spent on counseling and coordination of care.

## 2016-08-17 NOTE — Patient Instructions (Addendum)
Second Trimester of Pregnancy The second trimester is from week 13 through week 28, month 4 through 6. This is often the time in pregnancy that you feel your best. Often times, morning sickness has lessened or quit. You may have more energy, and you may get hungry more often. Your unborn baby (fetus) is growing rapidly. At the end of the sixth month, he or she is about 9 inches long and weighs about 1 pounds. You will likely feel the baby move (quickening) between 18 and 20 weeks of pregnancy. HOME CARE   Avoid all smoking, herbs, and alcohol. Avoid drugs not approved by your doctor.  Do not use any tobacco products, including cigarettes, chewing tobacco, and electronic cigarettes. If you need help quitting, ask your doctor. You may get counseling or other support to help you quit.  Only take medicine as told by your doctor. Some medicines are safe and some are not during pregnancy.  Exercise only as told by your doctor. Stop exercising if you start having cramps.  Eat regular, healthy meals.  Wear a good support bra if your breasts are tender.  Do not use hot tubs, steam rooms, or saunas.  Wear your seat belt when driving.  Avoid raw meat, uncooked cheese, and liter boxes and soil used by cats.  Take your prenatal vitamins.  Take 1500-2000 milligrams of calcium daily starting at the 20th week of pregnancy until you deliver your baby.  Try taking medicine that helps you poop (stool softener) as needed, and if your doctor approves. Eat more fiber by eating fresh fruit, vegetables, and whole grains. Drink enough fluids to keep your pee (urine) clear or pale yellow.  Take warm water baths (sitz baths) to soothe pain or discomfort caused by hemorrhoids. Use hemorrhoid cream if your doctor approves.  If you have puffy, bulging veins (varicose veins), wear support hose. Raise (elevate) your feet for 15 minutes, 3-4 times a day. Limit salt in your diet.  Avoid heavy lifting, wear low heals,  and sit up straight.  Rest with your legs raised if you have leg cramps or low back pain.  Visit your dentist if you have not gone during your pregnancy. Use a soft toothbrush to brush your teeth. Be gentle when you floss.  You can have sex (intercourse) unless your doctor tells you not to.  Go to your doctor visits. GET HELP IF:   You feel dizzy.  You have mild cramps or pressure in your lower belly (abdomen).  You have a nagging pain in your belly area.  You continue to feel sick to your stomach (nauseous), throw up (vomit), or have watery poop (diarrhea).  You have bad smelling fluid coming from your vagina.  You have pain with peeing (urination). GET HELP RIGHT AWAY IF:   You have a fever.  You are leaking fluid from your vagina.  You have spotting or bleeding from your vagina.  You have severe belly cramping or pain.  You lose or gain weight rapidly.  You have trouble catching your breath and have chest pain.  You notice sudden or extreme puffiness (swelling) of your face, hands, ankles, feet, or legs.  You have not felt the baby move in over an hour.  You have severe headaches that do not go away with medicine.  You have vision changes.   This information is not intended to replace advice given to you by your health care provider. Make sure you discuss any questions you have with your  health care provider.   Document Released: 12/20/2009 Document Revised: 10/16/2014 Document Reviewed: 11/26/2012 Elsevier Interactive Patient Education 2016 [employer] How a Baby Grows During Pregnancy Pregnancy begins when a female's sperm enters a female's egg (fertilization). This happens in one of the tubes (fallopian tubes) that connect the ovaries to the womb (uterus). The fertilized egg is called an embryo until it reaches 10 weeks. From 10 weeks until birth, it is called a fetus. The fertilized egg moves down the fallopian tube to the uterus. Then it implants into the  lining of the uterus and begins to grow. The developing fetus receives oxygen and nutrients through the pregnant woman's bloodstream and the tissues that grow (placenta) to support the fetus. The placenta is the life support system for the fetus. It provides nutrition and removes waste. Learning as much as you can about your pregnancy and how your baby is developing can help you enjoy the experience. It can also make you aware of when there might be a problem and when to ask questions. HOW LONG DOES A TYPICAL PREGNANCY LAST? A pregnancy usually lasts 280 days, or about 40 weeks. Pregnancy is divided into three trimesters:  First trimester: 0-13 weeks.  Second trimester: 14-27 weeks.  Third trimester: 28-40 weeks. The day when your baby is considered ready to be born (full term) is your estimated date of delivery. HOW DOES MY BABY DEVELOP MONTH BY MONTH? First month  The fertilized egg attaches to the inside of the uterus.  Some cells will form the placenta. Others will form the fetus.  The arms, legs, brain, spinal cord, lungs, and heart begin to develop.  At the end of the first month, the heart begins to beat. Second month  The bones, inner ear, eyelids, hands, and feet form.  The genitals develop.  By the end of 8 weeks, all major organs are developing. Third month  All of the internal organs are forming.  Teeth develop below the gums.  Bones and muscles begin to grow. The spine can flex.  The skin is transparent.  Fingernails and toenails begin to form.  Arms and legs continue to grow longer, and hands and feet develop.  The fetus is about 3 in (7.6 cm) long. Fourth month  The placenta is completely formed.  The external sex organs, neck, outer ear, eyebrows, eyelids, and fingernails are formed.  The fetus can hear, swallow, and move its arms and legs.  The kidneys begin to produce urine.  The skin is covered with a white waxy coating (vernix) and very fine  hair (lanugo). Fifth month  The fetus moves around more and can be felt for the first time (quickening).  The fetus starts to sleep and wake up and may begin to suck its finger.  The nails grow to the end of the fingers.  The organ in the digestive system that makes bile (gallbladder) functions and helps to digest the nutrients.  If your baby is a girl, eggs are present in her ovaries. If your baby is a boy, testicles start to move down into his scrotum. Sixth month  The lungs are formed, but the fetus is not yet able to breathe.  The eyes open. The brain continues to develop.  Your baby has fingerprints and toe prints. Your baby's hair grows thicker.  At the end of the second trimester, the fetus is about 9 in (22.9 cm) long. Seventh month  The fetus kicks and stretches.  The eyes are developed enough  to sense changes in light.  The hands can make a grasping motion.  The fetus responds to sound. Eighth month  All organs and body systems are fully developed and functioning.  Bones harden and taste buds develop. The fetus may hiccup.  Certain areas of the brain are still developing. The skull remains soft. Ninth month  The fetus gains about  lb (0.23 kg) each week.  The lungs are fully developed.  Patterns of sleep develop.  The fetus's head typically moves into a head-down position (vertex) in the uterus to prepare for birth. If the buttocks move into a vertex position instead, the baby is breech.  The fetus weighs 6-9 lbs (2.72-4.08 kg) and is 19-20 in (48.26-50.8 cm) long. WHAT CAN I DO TO HAVE A HEALTHY PREGNANCY AND HELP MY BABY DEVELOP? Eating and Drinking  Eat a healthy diet.  Talk with your health care provider to make sure that you are getting the nutrients that you and your baby need.  Visit www.[employer].bechoosemyplate.gov to learn about creating a healthy diet.  Gain a healthy amount of weight during pregnancy as advised by your health care provider. This is  usually 25-35 pounds. You may need to:  Gain more if you were underweight before getting pregnant or if you are pregnant with more than one baby.  Gain less if you were overweight or obese when you got pregnant. Medicines and Vitamins  Take prenatal vitamins as directed by your health care provider. These include vitamins such as folic acid, iron, calcium, and vitamin D. They are important for healthy development.  Take medicines only as directed by your health care provider. Read labels and ask a pharmacist or your health care provider whether over-the-counter medicines, supplements, and prescription drugs are safe to take during pregnancy. Activities  Be physically active as advised by your health care provider. Ask your health care provider to recommend activities that are safe for you to do, such as walking or swimming.  Do not participate in strenuous or extreme sports. Lifestyle  Do not drink alcohol.  Do not use any tobacco products, including cigarettes, chewing tobacco, or electronic cigarettes. If you need help quitting, ask your health care provider.  Do not use illegal drugs. Safety  Avoid exposure to mercury, lead, or other heavy metals. Ask your health care provider about common sources of these heavy metals.  Avoid listeria infection during pregnancy. Follow these precautions:  Do not eat soft cheeses or deli meats.  Do not eat hot dogs unless they have been warmed up to the point of steaming, such as in the microwave oven.  Do not drink unpasteurized milk.  Avoid toxoplasmosis infection during pregnancy. Follow these precautions:  Do not change your cat's litter box, if you have a cat. Ask someone else to do this for you.  Wear gardening gloves while working in the yard. General Instructions  Keep all follow-up visits as directed by your health care provider. This is important. This includes prenatal care and screening tests.  Manage any chronic health  conditions. Work closely with your health care provider to keep conditions, such as diabetes, under control. HOW DO I KNOW IF MY BABY IS DEVELOPING WELL? At each prenatal visit, your health care provider will do several different tests to check on your health and keep track of your baby's development. These include:  Fundal height.  Your health care provider will measure your growing belly from top to bottom using a tape measure.  Your health care  provider will also feel your belly to determine your baby's position.  Heartbeat.  An ultrasound in the first trimester can confirm pregnancy and show a heartbeat, depending on how far along you are.  Your health care provider will check your baby's heart rate at every prenatal visit.  As you get closer to your delivery date, you may have regular fetal heart rate monitoring to make sure that your baby is not in distress.  Second trimester ultrasound.  This ultrasound checks your baby's development. It also indicates your baby's gender. WHAT SHOULD I DO IF I HAVE CONCERNS ABOUT MY BABY'S DEVELOPMENT? Always talk with your health care provider about any concerns that you may have.   This information is not intended to replace advice given to you by your health care provider. Make sure you discuss any questions you have with your health care provider.   Document Released: 03/13/2008 Document Revised: 06/16/2015 Document Reviewed: 03/04/2014 Elsevier Interactive Patient Education 2016 [employer] Inc[employer]  Gastroesophageal Reflux Disease, Adult Normally, food travels down the esophagus and stays in the stomach to be digested. However, when a person has gastroesophageal reflux disease (GERD), food and stomach acid move back up into the esophagus. When this happens, the esophagus becomes sore and inflamed. Over time, GERD can create small holes (ulcers) in the lining of the esophagus.  CAUSES This condition is caused by a problem with the muscle between  the esophagus and the stomach (lower esophageal sphincter, or LES). Normally, the LES muscle closes after food passes through the esophagus to the stomach. When the LES is weakened or abnormal, it does not close properly, and that allows food and stomach acid to go back up into the esophagus. The LES can be weakened by certain dietary substances, medicines, and medical conditions, including:  Tobacco use.  Pregnancy.  Having a hiatal hernia.  Heavy alcohol use.  Certain foods and beverages, such as coffee, chocolate, onions, and peppermint. RISK FACTORS This condition is more likely to develop in:  People who have an increased body weight.  People who have connective tissue disorders.  People who use NSAID medicines. SYMPTOMS Symptoms of this condition include:  Heartburn.  Difficult or painful swallowing.  The feeling of having a lump in the throat.  Abitter taste in the mouth.  Bad breath.  Having a large amount of saliva.  Having an upset or bloated stomach.  Belching.  Chest pain.  Shortness of breath or wheezing.  Ongoing (chronic) cough or a night-time cough.  Wearing away of tooth enamel.  Weight loss. Different conditions can cause chest pain. Make sure to see your health care provider if you experience chest pain. DIAGNOSIS Your health care provider will take a medical history and perform a physical exam. To determine if you have mild or severe GERD, your health care provider may also monitor how you respond to treatment. You may also have other tests, including:  An endoscopy toexamine your stomach and esophagus with a small camera.  A test thatmeasures the acidity level in your esophagus.  A test thatmeasures how much pressure is on your esophagus.  A barium swallow or modified barium swallow to show the shape, size, and functioning of your esophagus. TREATMENT The goal of treatment is to help relieve your symptoms and to prevent  complications. Treatment for this condition may vary depending on how severe your symptoms are. Your health care provider may recommend:  Changes to your diet.  Medicine.  Surgery. HOME CARE INSTRUCTIONS Diet  Follow a diet as recommended by your health care provider. This may involve avoiding foods and drinks such as:  Coffee and tea (with or without caffeine).  Drinks that containalcohol.  Energy drinks and sports drinks.  Carbonated drinks or sodas.  Chocolate and cocoa.  Peppermint and mint flavorings.  Garlic and onions.  Horseradish.  Spicy and acidic foods, including peppers, chili powder, curry powder, vinegar, hot sauces, and barbecue sauce.  Citrus fruit juices and citrus fruits, such as oranges, lemons, and limes.  Tomato-based foods, such as red sauce, chili, salsa, and pizza with red sauce.  Fried and fatty foods, such as donuts, french fries, potato chips, and high-fat dressings.  High-fat meats, such as hot dogs and fatty cuts of red and white meats, such as rib eye steak, sausage, ham, and bacon.  High-fat dairy items, such as whole milk, butter, and cream cheese.  Eat small, frequent meals instead of large meals.  Avoid drinking large amounts of liquid with your meals.  Avoid eating meals during the 2-3 hours before bedtime.  Avoid lying down right after you eat.  Do not exercise right after you eat. General Instructions  Pay attention to any changes in your symptoms.  Take over-the-counter and prescription medicines only as told by your health care provider. Do not take aspirin, ibuprofen, or other NSAIDs unless your health care provider told you to do so.  Do not use any tobacco products, including cigarettes, chewing tobacco, and e-cigarettes. If you need help quitting, ask your health care provider.  Wear loose-fitting clothing. Do not wear anything tight around your waist that causes pressure on your abdomen.  Raise (elevate) the  head of your bed 6 inches (15cm).  Try to reduce your stress, such as with yoga or meditation. If you need help reducing stress, ask your health care provider.  If you are overweight, reduce your weight to an amount that is healthy for you. Ask your health care provider for guidance about a safe weight loss goal.  Keep all follow-up visits as told by your health care provider. This is important. SEEK MEDICAL CARE IF:  You have new symptoms.  You have unexplained weight loss.  You have difficulty swallowing, or it hurts to swallow.  You have wheezing or a persistent cough.  Your symptoms do not improve with treatment.  You have a hoarse voice. SEEK IMMEDIATE MEDICAL CARE IF:  You have pain in your arms, neck, jaw, teeth, or back.  You feel sweaty, dizzy, or light-headed.  You have chest pain or shortness of breath.  You vomit and your vomit looks like blood or coffee grounds.  You faint.  Your stool is bloody or black.  You cannot swallow, drink, or eat.   This information is not intended to replace advice given to you by your health care provider. Make sure you discuss any questions you have with your health care provider.   Document Released: 07/05/2005 Document Revised: 06/16/2015 Document Reviewed: 01/20/2015 Elsevier Interactive Patient Education [employer]

## 2016-08-17 NOTE — Addendum Note (Signed)
Addended by: Samantha CrimesENNEY, Laruen Risser ANNE on: 08/17/2016 11:53 AM   Modules accepted: Orders

## 2016-08-17 NOTE — Progress Notes (Signed)
Pt had u/s in office today.

## 2016-08-24 ENCOUNTER — Other Ambulatory Visit: Payer: Self-pay | Admitting: Certified Nurse Midwife

## 2016-08-24 DIAGNOSIS — Z3480 Encounter for supervision of other normal pregnancy, unspecified trimester: Secondary | ICD-10-CM

## 2016-09-14 ENCOUNTER — Encounter: Payer: Self-pay | Admitting: Certified Nurse Midwife

## 2016-09-14 ENCOUNTER — Ambulatory Visit (INDEPENDENT_AMBULATORY_CARE_PROVIDER_SITE_OTHER): Payer: Medicaid Other | Admitting: Certified Nurse Midwife

## 2016-09-14 VITALS — BP 96/70 | HR 80 | Wt 128.0 lb

## 2016-09-14 DIAGNOSIS — O0932 Supervision of pregnancy with insufficient antenatal care, second trimester: Secondary | ICD-10-CM

## 2016-09-14 DIAGNOSIS — O99612 Diseases of the digestive system complicating pregnancy, second trimester: Secondary | ICD-10-CM

## 2016-09-14 DIAGNOSIS — K219 Gastro-esophageal reflux disease without esophagitis: Secondary | ICD-10-CM

## 2016-09-14 DIAGNOSIS — Z3480 Encounter for supervision of other normal pregnancy, unspecified trimester: Secondary | ICD-10-CM

## 2016-09-14 MED ORDER — OB COMPLETE PETITE 35-5-1-200 MG PO CAPS: 1.0000 | ORAL_CAPSULE | Freq: Every day | ORAL | 12 refills | Status: DC

## 2016-09-14 NOTE — Progress Notes (Signed)
Subjective:    Marie Holt is a 25 y.o. female being seen today for her obstetrical visit. She is at 153w5d gestation. Patient reports: nausea, no bleeding, no contractions, no cramping and no leaking . Fetal movement: normal.  Problem List Items Addressed This Visit      Digestive   Gastroesophageal reflux during pregnancy in second trimester, antepartum - Primary     Other   Supervision of other normal pregnancy, antepartum   Relevant Medications   Prenat-FeCbn-FeAspGl-FA-Omega (OB COMPLETE PETITE) 35-5-1-200 MG CAPS   Late prenatal care affecting pregnancy in second trimester     Patient Active Problem List   Diagnosis Date Noted  . Gastroesophageal reflux during pregnancy in second trimester, antepartum 08/17/2016  . Supervision of other normal pregnancy, antepartum 07/25/2016  . Late prenatal care affecting pregnancy in second trimester 07/25/2016  . Mass on back 01/12/2014  . Kidney stone 04/18/2012  . Asthma 01/29/2012  . Poor dentition 01/29/2012  . Irregular menstrual cycle 01/29/2012   Objective:    BP 96/70   Pulse 80   Wt 128 lb (58.1 kg)   LMP 02/09/2016 (Approximate) Comment: doesn't come every month  BMI 23.41 kg/m  FHT: 140 BPM  Uterine Size: 23 cm and size equals dates     Assessment:    Pregnancy @ 792w5d    Plan:    OBGCT: discussed and ordered for next visit. Signs and symptoms of preterm labor: discussed and handout given.  Labs, problem list reviewed and updated 2 hr GTT planned Follow up in 4 weeks.

## 2016-10-12 ENCOUNTER — Ambulatory Visit (INDEPENDENT_AMBULATORY_CARE_PROVIDER_SITE_OTHER): Payer: Medicaid Other | Admitting: Certified Nurse Midwife

## 2016-10-12 ENCOUNTER — Other Ambulatory Visit: Payer: Medicaid Other

## 2016-10-12 VITALS — BP 100/63 | HR 90 | Temp 97.8°F | Wt 137.4 lb

## 2016-10-12 DIAGNOSIS — O99612 Diseases of the digestive system complicating pregnancy, second trimester: Secondary | ICD-10-CM

## 2016-10-12 DIAGNOSIS — O99512 Diseases of the respiratory system complicating pregnancy, second trimester: Secondary | ICD-10-CM

## 2016-10-12 DIAGNOSIS — K219 Gastro-esophageal reflux disease without esophagitis: Secondary | ICD-10-CM

## 2016-10-12 DIAGNOSIS — Z3480 Encounter for supervision of other normal pregnancy, unspecified trimester: Secondary | ICD-10-CM

## 2016-10-12 DIAGNOSIS — O26832 Pregnancy related renal disease, second trimester: Secondary | ICD-10-CM

## 2016-10-12 DIAGNOSIS — N201 Calculus of ureter: Secondary | ICD-10-CM

## 2016-10-12 DIAGNOSIS — J4521 Mild intermittent asthma with (acute) exacerbation: Secondary | ICD-10-CM

## 2016-10-12 DIAGNOSIS — O0932 Supervision of pregnancy with insufficient antenatal care, second trimester: Secondary | ICD-10-CM

## 2016-10-12 MED ORDER — ALBUTEROL SULFATE HFA 108 (90 BASE) MCG/ACT IN AERS: 2.0000 | INHALATION_SPRAY | Freq: Four times a day (QID) | RESPIRATORY_TRACT | 1 refills | Status: DC | PRN

## 2016-10-12 NOTE — Progress Notes (Signed)
Subjective:    Marie Holt is a 26 y.o. female being seen today for her obstetrical visit. She is at 3375w5d gestation. Patient reports: no complaints . Fetal movement: normal.  Reports asthma attack, does not have a current inhaler.    Problem List Items Addressed This Visit      Digestive   Gastroesophageal reflux during pregnancy in second trimester, antepartum - Primary     Genitourinary   RESOLVED: Right ureteral stone     Other   Supervision of other normal pregnancy, antepartum   Relevant Orders   Glucose Tolerance, 2 Hours w/1 Hour (Completed)   CBC (Completed)   HIV antibody (with reflex) (Completed)   RPR (Completed)   Late prenatal care affecting pregnancy in second trimester    Other Visit Diagnoses    Mild intermittent asthma with acute exacerbation       Relevant Medications   albuterol (PROVENTIL HFA;VENTOLIN HFA) 108 (90 Base) MCG/ACT inhaler     Patient Active Problem List   Diagnosis Date Noted  . Gastroesophageal reflux during pregnancy in second trimester, antepartum 08/17/2016  . Supervision of other normal pregnancy, antepartum 07/25/2016  . Late prenatal care affecting pregnancy in second trimester 07/25/2016  . Asthma 01/29/2012  . Poor dentition 01/29/2012   Objective:    BP 100/63   Pulse 90   Temp 97.8 F (36.6 C)   Wt 137 lb 6.4 oz (62.3 kg)   LMP 02/09/2016 (Approximate) Comment: doesn't come every month  BMI 25.13 kg/m  FHT: 146 BPM  Uterine Size: 27 cm and size equals dates     Assessment:    Pregnancy @ 6075w5d    Asthma   Plan:   BTL for contraception   2 hour OGTT today Labs, problem list reviewed and updated Albuterol ordered.   Follow up in 2 weeks.

## 2016-10-12 NOTE — Patient Instructions (Signed)
Asthma, Acute Bronchospasm °Acute bronchospasm caused by asthma is also referred to as an asthma attack. Bronchospasm means your air passages become narrowed. The narrowing is caused by inflammation and tightening of the muscles in the air tubes (bronchi) in your lungs. This can make it hard to breathe or cause you to wheeze and cough. °What are the causes? °Possible triggers are: °· Animal dander from the skin, hair, or feathers of animals. °· Dust mites contained in house dust. °· Cockroaches. °· Pollen from trees or grass. °· Mold. °· Cigarette or tobacco smoke. °· Air pollutants such as dust, household cleaners, hair sprays, aerosol sprays, paint fumes, strong chemicals, or strong odors. °· Cold air or weather changes. Cold air may trigger inflammation. Winds increase molds and pollens in the air. °· Strong emotions such as crying or laughing hard. °· Stress. °· Certain medicines such as aspirin or beta-blockers. °· Sulfites in foods and drinks, such as dried fruits and wine. °· Infections or inflammatory conditions, such as a flu, cold, or inflammation of the nasal membranes (rhinitis). °· Gastroesophageal reflux disease (GERD). GERD is a condition where stomach acid backs up into your esophagus. °· Exercise or strenuous activity. ° °What are the signs or symptoms? °· Wheezing. °· Excessive coughing, particularly at night. °· Chest tightness. °· Shortness of breath. °How is this diagnosed? °Your health care provider will ask you about your medical history and perform a physical exam. A chest X-ray or blood testing may be performed to look for other causes of your symptoms or other conditions that may have triggered your asthma attack. °How is this treated? °Treatment is aimed at reducing inflammation and opening up the airways in your lungs. Most asthma attacks are treated with inhaled medicines. These include quick relief or rescue medicines (such as bronchodilators) and controller medicines (such as inhaled  corticosteroids). These medicines are sometimes given through an inhaler or a nebulizer. Systemic steroid medicine taken by mouth or given through an IV tube also can be used to reduce the inflammation when an attack is moderate or severe. Antibiotic medicines are only used if a bacterial infection is present. °Follow these instructions at home: °· Rest. °· Drink plenty of liquids. This helps the mucus to remain thin and be easily coughed up. Only use caffeine in moderation and do not use alcohol until you have recovered from your illness. °· Do not smoke. Avoid being exposed to secondhand smoke. °· You play a critical role in keeping yourself in good health. Avoid exposure to things that cause you to wheeze or to have breathing problems. °· Keep your medicines up-to-date and available. Carefully follow your health care provider’s treatment plan. °· Take your medicine exactly as prescribed. °· When pollen or pollution is bad, keep windows closed and use an air conditioner or go to places with air conditioning. °· Asthma requires careful medical care. See your health care provider for a follow-up as advised. If you are more than [redacted] weeks pregnant and you were prescribed any new medicines, let your obstetrician know about the visit and how you are doing. Follow up with your health care provider as directed. °· After you have recovered from your asthma attack, make an appointment with your outpatient doctor to talk about ways to reduce the likelihood of future attacks. If you do not have a doctor who manages your asthma, make an appointment with a primary care doctor to discuss your asthma. °Get help right away if: °· You are getting worse. °·   You have trouble breathing. If severe, call your local emergency services (911 in the [place] °· You develop chest pain or discomfort. °· You are vomiting. °· You are not able to keep fluids down. °· You are coughing up yellow, green, brown, or bloody sputum. °· You have a fever  and your symptoms suddenly get worse. °· You have trouble swallowing. °This information is not intended to replace advice given to you by your health care provider. Make sure you discuss any questions you have with your health care provider. °Document Released: 01/10/2007 Document Revised: 03/08/2016 Document Reviewed: 04/02/2013 °Elsevier Interactive Patient Education © 2017 [employer] ° °

## 2016-10-13 ENCOUNTER — Other Ambulatory Visit: Payer: Self-pay | Admitting: Certified Nurse Midwife

## 2016-10-13 DIAGNOSIS — Z3480 Encounter for supervision of other normal pregnancy, unspecified trimester: Secondary | ICD-10-CM

## 2016-10-13 LAB — CBC
Hematocrit: 35 % (ref 34.0–46.6)
Hemoglobin: 11.6 g/dL (ref 11.1–15.9)
MCH: 30.9 pg (ref 26.6–33.0)
MCHC: 33.1 g/dL (ref 31.5–35.7)
MCV: 93 fL (ref 79–97)
PLATELETS: 254 10*3/uL (ref 150–379)
RBC: 3.76 x10E6/uL — AB (ref 3.77–5.28)
RDW: 13.2 % (ref 12.3–15.4)
WBC: 9.2 10*3/uL (ref 3.4–10.8)

## 2016-10-13 LAB — GLUCOSE TOLERANCE, 2 HOURS W/ 1HR
GLUCOSE, 2 HOUR: 83 mg/dL (ref 65–152)
GLUCOSE, FASTING: 79 mg/dL (ref 65–91)
Glucose, 1 hour: 93 mg/dL (ref 65–179)

## 2016-10-13 LAB — RPR: RPR: NONREACTIVE

## 2016-10-13 LAB — HIV ANTIBODY (ROUTINE TESTING W REFLEX): HIV SCREEN 4TH GENERATION: NONREACTIVE

## 2016-10-26 ENCOUNTER — Ambulatory Visit (INDEPENDENT_AMBULATORY_CARE_PROVIDER_SITE_OTHER): Payer: Medicaid Other | Admitting: Certified Nurse Midwife

## 2016-10-26 VITALS — BP 107/67 | HR 108 | Wt 136.5 lb

## 2016-10-26 DIAGNOSIS — Z23 Encounter for immunization: Secondary | ICD-10-CM

## 2016-10-26 DIAGNOSIS — O0932 Supervision of pregnancy with insufficient antenatal care, second trimester: Secondary | ICD-10-CM

## 2016-10-26 DIAGNOSIS — Z3480 Encounter for supervision of other normal pregnancy, unspecified trimester: Secondary | ICD-10-CM

## 2016-10-26 MED ORDER — TETANUS-DIPHTH-ACELL PERTUSSIS 5-2.5-18.5 LF-MCG/0.5 IM SUSP
0.5000 mL | Freq: Once | INTRAMUSCULAR | Status: AC
  Administered 2016-10-26: 0.5 mL via INTRAMUSCULAR

## 2016-10-26 NOTE — Progress Notes (Signed)
   PRENATAL VISIT NOTE  Subjective:  Marie Holt is a [age] y.oCoralyn Holt. G3P2002 at 3970w5d being seen today for ongoing prenatal care.  She is currently monitored for the following issues for this low-risk pregnancy and has Asthma; Poor dentition; Supervision of other normal pregnancy, antepartum; Late prenatal care affecting pregnancy in second trimester; and Gastroesophageal reflux during pregnancy in second trimester, antepartum on her problem list.  Patient reports no complaints.  Contractions: Irregular. Vag. Bleeding: None.  Movement: Present. Denies leaking of fluid.   The following portions of the patient's history were reviewed and updated as appropriate: allergies, current medications, past family history, past medical history, past social history, past surgical history and problem list. Problem list updated.  Objective:   Vitals:   10/26/16 1607  BP: 107/67  Pulse: (!) 108  Weight: 136 lb 8 oz (61.9 kg)    Fetal Status: Fetal Heart Rate (bpm): 146 Fundal Height: 27 cm Movement: Present     General:  Alert, oriented and cooperative. Patient is in no acute distress.  Skin: Skin is warm and dry. No rash noted.   Cardiovascular: Normal heart rate noted  Respiratory: Normal respiratory effort, no problems with respiration noted  Abdomen: Soft, gravid, appropriate for gestational age. Pain/Pressure: Absent     Pelvic:  Cervical exam deferred        Extremities: Normal range of motion.     Mental Status: Normal mood and affect. Normal behavior. Normal judgment and thought content.   Assessment and Plan:  Pregnancy: G3P2002 at 6370w5d  1. Supervision of other normal pregnancy, antepartum      Aquphor ointment OTC for dry skin/lips  2. Encounter for immunization     - Flu Vaccine QUAD 36+ mos IM  3. Late prenatal care affecting pregnancy in second trimester      Intiated care @15  weeks.   Preterm labor symptoms and general obstetric precautions including but not limited to vaginal  bleeding, contractions, leaking of fluid and fetal movement were reviewed in detail with the patient. Please refer to After Visit Summary for other counseling recommendations.  No Follow-up on file.   Roe Coombsachelle A Ashonte Angelucci, CNM

## 2016-11-13 ENCOUNTER — Encounter: Payer: Medicaid Other | Admitting: Certified Nurse Midwife

## 2016-11-22 ENCOUNTER — Ambulatory Visit (INDEPENDENT_AMBULATORY_CARE_PROVIDER_SITE_OTHER): Payer: Medicaid Other | Admitting: Certified Nurse Midwife

## 2016-11-22 VITALS — BP 95/61 | HR 89 | Wt 139.0 lb

## 2016-11-22 DIAGNOSIS — O0933 Supervision of pregnancy with insufficient antenatal care, third trimester: Secondary | ICD-10-CM

## 2016-11-22 DIAGNOSIS — M5442 Lumbago with sciatica, left side: Secondary | ICD-10-CM

## 2016-11-22 DIAGNOSIS — Z3480 Encounter for supervision of other normal pregnancy, unspecified trimester: Secondary | ICD-10-CM

## 2016-11-22 DIAGNOSIS — M5441 Lumbago with sciatica, right side: Secondary | ICD-10-CM

## 2016-11-22 DIAGNOSIS — O0932 Supervision of pregnancy with insufficient antenatal care, second trimester: Secondary | ICD-10-CM

## 2016-11-22 MED ORDER — CYCLOBENZAPRINE HCL 10 MG PO TABS: 10.0000 mg | ORAL_TABLET | Freq: Three times a day (TID) | ORAL | 1 refills | Status: DC | PRN

## 2016-11-22 NOTE — Progress Notes (Signed)
   PRENATAL VISIT NOTE  Subjective:  Marie Holt is a 26 y.o. G3P2002 at 7474w4d being seen today for ongoing prenatal care.  She is currently monitored for the following issues for this low-risk pregnancy and has Asthma; Poor dentition; Supervision of other normal pregnancy, antepartum; Late prenatal care affecting pregnancy in second trimester; and Gastroesophageal reflux during pregnancy in second trimester, antepartum on her problem list.  Patient reports backache, no bleeding, no contractions, no cramping and no leaking.  Contractions: Irregular. Vag. Bleeding: None.  Movement: Present. Denies leaking of fluid.   The following portions of the patient's history were reviewed and updated as appropriate: allergies, current medications, past family history, past medical history, past social history, past surgical history and problem list. Problem list updated.  Objective:   Vitals:   11/22/16 1107  BP: 95/61  Pulse: 89  Weight: 139 lb (63 kg)    Fetal Status: Fetal Heart Rate (bpm): 133 Fundal Height: 32 cm Movement: Present     General:  Alert, oriented and cooperative. Patient is in no acute distress.  Skin: Skin is warm and dry. No rash noted.   Cardiovascular: Normal heart rate noted  Respiratory: Normal respiratory effort, no problems with respiration noted  Abdomen: Soft, gravid, appropriate for gestational age. Pain/Pressure: Present     Pelvic:  Cervical exam deferred        Extremities: Normal range of motion.     Mental Status: Normal mood and affect. Normal behavior. Normal judgment and thought content.   Assessment and Plan:  Pregnancy: G3P2002 at 8574w4d  1. Supervision of other normal pregnancy, antepartum Normal discomforts of pregnancy  2. Late prenatal care affecting pregnancy in second trimester      3. Acute bilateral low back pain with bilateral sciatica      - cyclobenzaprine (FLEXERIL) 10 MG tablet; Take 1 tablet (10 mg total) by mouth every 8 (eight)  hours as needed for muscle spasms.  Dispense: 30 tablet; Refill: 1  Preterm labor symptoms and general obstetric precautions including but not limited to vaginal bleeding, contractions, leaking of fluid and fetal movement were reviewed in detail with the patient. Please refer to After Visit Summary for other counseling recommendations.  Return in about 2 weeks (around 12/06/2016) for ROB.   Roe Coombsachelle A Nima Kemppainen, CNM

## 2016-11-22 NOTE — Progress Notes (Signed)
Pt states she is having lower back pain, radiates down legs.

## 2016-12-06 ENCOUNTER — Ambulatory Visit (INDEPENDENT_AMBULATORY_CARE_PROVIDER_SITE_OTHER): Payer: Medicaid Other | Admitting: Certified Nurse Midwife

## 2016-12-06 ENCOUNTER — Other Ambulatory Visit (HOSPITAL_COMMUNITY)
Admission: RE | Admit: 2016-12-06 | Discharge: 2016-12-06 | Disposition: A | Discharge status: Home / Self Care | Payer: Medicaid Other | Source: Ambulatory Visit | Attending: Certified Nurse Midwife | Admitting: Certified Nurse Midwife

## 2016-12-06 VITALS — BP 115/68 | HR 85 | Wt 140.0 lb

## 2016-12-06 DIAGNOSIS — Z3480 Encounter for supervision of other normal pregnancy, unspecified trimester: Secondary | ICD-10-CM

## 2016-12-06 DIAGNOSIS — O4690 Antepartum hemorrhage, unspecified, unspecified trimester: Secondary | ICD-10-CM

## 2016-12-06 DIAGNOSIS — K219 Gastro-esophageal reflux disease without esophagitis: Secondary | ICD-10-CM

## 2016-12-06 DIAGNOSIS — O4692 Antepartum hemorrhage, unspecified, second trimester: Secondary | ICD-10-CM

## 2016-12-06 DIAGNOSIS — O0932 Supervision of pregnancy with insufficient antenatal care, second trimester: Secondary | ICD-10-CM

## 2016-12-06 DIAGNOSIS — O99612 Diseases of the digestive system complicating pregnancy, second trimester: Secondary | ICD-10-CM

## 2016-12-06 DIAGNOSIS — O2243 Hemorrhoids in pregnancy, third trimester: Secondary | ICD-10-CM

## 2016-12-06 NOTE — Progress Notes (Signed)
Pt states she may have Bacterial infection, d/c with slight odor. Pt states she may also have hemorrhoids.

## 2016-12-06 NOTE — Progress Notes (Signed)
   PRENATAL VISIT NOTE  Subjective:  Marie Holt is a 26 y.o. G3P2002 at 343w4d being seen today for ongoing prenatal care.  She is currently monitored for the following issues for this low-risk pregnancy and has Asthma; Poor dentition; Supervision of other normal pregnancy, antepartum; Late prenatal care affecting pregnancy in second trimester; Gastroesophageal reflux during pregnancy in second trimester, antepartum; and Hemorrhoids during pregnancy in third trimester on her problem list.  Patient reports no bleeding, no cramping, no leaking, occasional contractions and vaginal irritation.   .  .   . Denies leaking of fluid.   The following portions of the patient's history were reviewed and updated as appropriate: allergies, current medications, past family history, past medical history, past social history, past surgical history and problem list. Problem list updated.  Objective:   Vitals:   12/06/16 1005  BP: 115/68  Pulse: 85  Weight: 140 lb (63.5 kg)    Fetal Status: Fetal Heart Rate (bpm): 130 Fundal Height: 34 cm    Presentation: Vertex  General:  Alert, oriented and cooperative. Patient is in no acute distress.  Skin: Skin is warm and dry. No rash noted.   Cardiovascular: Normal heart rate noted  Respiratory: Normal respiratory effort, no problems with respiration noted  Abdomen: Soft, gravid, appropriate for gestational age.       Pelvic:  Cervical exam deferred Dilation: 1 Effacement (%): 50 Station: -3  Extremities: Normal range of motion.     Mental Status: Normal mood and affect. Normal behavior. Normal judgment and thought content.   Assessment and Plan:  Pregnancy: G3P2002 at 523w4d  1. Gastroesophageal reflux during pregnancy in second trimester, antepartum     On Prilosec  2. Supervision of other normal pregnancy, antepartum     Orange swab obtained for testing. Probable BV.    3. Late prenatal care affecting pregnancy in second trimester       4.  Hemorrhoids during pregnancy in third trimester     OTC tucks, ointments  Preterm labor symptoms and general obstetric precautions including but not limited to vaginal bleeding, contractions, leaking of fluid and fetal movement were reviewed in detail with the patient. Please refer to After Visit Summary for other counseling recommendations.  Return in about 1 week (around 12/13/2016) for ROB, GBS.   Roe Coombsachelle A Tyrek Lawhorn, CNM

## 2016-12-06 NOTE — Addendum Note (Signed)
Addended by: Marya LandryFOSTER, SUZANNE D on: 12/06/2016 03:51 PM   Modules accepted: Orders

## 2016-12-08 ENCOUNTER — Telehealth: Payer: Self-pay

## 2016-12-08 LAB — CERVICOVAGINAL ANCILLARY ONLY
BACTERIAL VAGINITIS: NEGATIVE
CANDIDA VAGINITIS: NEGATIVE
CHLAMYDIA, DNA PROBE: NEGATIVE
NEISSERIA GONORRHEA: NEGATIVE
TRICH (WINDOWPATH): NEGATIVE

## 2016-12-08 NOTE — Telephone Encounter (Signed)
Returned call and advised that results are not back.

## 2016-12-13 ENCOUNTER — Other Ambulatory Visit (HOSPITAL_COMMUNITY)
Admission: RE | Admit: 2016-12-13 | Discharge: 2016-12-13 | Disposition: A | Discharge status: Home / Self Care | Payer: Medicaid Other | Source: Ambulatory Visit | Attending: Certified Nurse Midwife | Admitting: Certified Nurse Midwife

## 2016-12-13 ENCOUNTER — Ambulatory Visit (INDEPENDENT_AMBULATORY_CARE_PROVIDER_SITE_OTHER): Payer: Medicaid Other | Admitting: Certified Nurse Midwife

## 2016-12-13 VITALS — BP 105/65 | HR 91 | Wt 142.2 lb

## 2016-12-13 DIAGNOSIS — O0932 Supervision of pregnancy with insufficient antenatal care, second trimester: Secondary | ICD-10-CM

## 2016-12-13 DIAGNOSIS — Z3480 Encounter for supervision of other normal pregnancy, unspecified trimester: Secondary | ICD-10-CM | POA: Diagnosis present

## 2016-12-13 DIAGNOSIS — O0933 Supervision of pregnancy with insufficient antenatal care, third trimester: Secondary | ICD-10-CM

## 2016-12-13 NOTE — Progress Notes (Signed)
   PRENATAL VISIT NOTE  Subjective:  Marie Holt is a 26 y.o. G3P2002 at 820w4d being seen today for ongoing prenatal care.  She is currently monitored for the following issues for this low-risk pregnancy and has Asthma; Poor dentition; Supervision of other normal pregnancy, antepartum; Late prenatal care affecting pregnancy in second trimester; Gastroesophageal reflux during pregnancy in second trimester, antepartum; and Hemorrhoids during pregnancy in third trimester on her problem list.  Patient reports backache, no bleeding, no leaking and occasional contractions.  Contractions: Irregular. Vag. Bleeding: None.  Movement: Present. Denies leaking of fluid.   The following portions of the patient's history were reviewed and updated as appropriate: allergies, current medications, past family history, past medical history, past social history, past surgical history and problem list. Problem list updated.  Objective:   Vitals:   12/13/16 1528  BP: 105/65  Pulse: 91  Weight: 142 lb 3.2 oz (64.5 kg)    Fetal Status: Fetal Heart Rate (bpm): 138 Fundal Height: 35 cm Movement: Present  Presentation: Vertex  General:  Alert, oriented and cooperative. Patient is in no acute distress.  Skin: Skin is warm and dry. No rash noted.   Cardiovascular: Normal heart rate noted  Respiratory: Normal respiratory effort, no problems with respiration noted  Abdomen: Soft, gravid, appropriate for gestational age. Pain/Pressure: Present     Pelvic:  Cervical exam performed Dilation: 1 Effacement (%): 50 Station: -3  Extremities: Normal range of motion.  Edema: None  Mental Status: Normal mood and affect. Normal behavior. Normal judgment and thought content.   Assessment and Plan:  Pregnancy: G3P2002 at 6220w4d  1. Supervision of other normal pregnancy, antepartum     Cervix unchanged from last exam.   - Strep Gp B NAA - Cervicovaginal ancillary only  2. Late prenatal care affecting pregnancy in second  trimester     Started care @15  wks.   Preterm labor symptoms and general obstetric precautions including but not limited to vaginal bleeding, contractions, leaking of fluid and fetal movement were reviewed in detail with the patient. Please refer to After Visit Summary for other counseling recommendations.  Return in about 1 week (around 12/20/2016) for ROB.   Roe Coombsachelle A Nile Prisk, CNM

## 2016-12-13 NOTE — Progress Notes (Signed)
Patient reports she is having pain and pressure all day. She states she is having irregular contractions all day- but nothing strong enough to call labor

## 2016-12-14 LAB — CERVICOVAGINAL ANCILLARY ONLY
BACTERIAL VAGINITIS: NEGATIVE
Candida vaginitis: NEGATIVE
Chlamydia: NEGATIVE
Neisseria Gonorrhea: NEGATIVE
Trichomonas: NEGATIVE

## 2016-12-15 LAB — STREP GP B NAA: STREP GROUP B AG: NEGATIVE

## 2016-12-22 ENCOUNTER — Encounter: Payer: Self-pay | Admitting: Certified Nurse Midwife

## 2016-12-22 ENCOUNTER — Ambulatory Visit (INDEPENDENT_AMBULATORY_CARE_PROVIDER_SITE_OTHER): Payer: Medicaid Other | Admitting: Certified Nurse Midwife

## 2016-12-22 VITALS — BP 93/63 | HR 94 | Wt 142.7 lb

## 2016-12-22 DIAGNOSIS — Z3483 Encounter for supervision of other normal pregnancy, third trimester: Secondary | ICD-10-CM

## 2016-12-22 DIAGNOSIS — Z3480 Encounter for supervision of other normal pregnancy, unspecified trimester: Secondary | ICD-10-CM

## 2016-12-22 NOTE — Progress Notes (Signed)
   PRENATAL VISIT NOTE  Subjective:  Marie Holt is a 26 y.o. G3P2002 at 2472w6d being seen today for ongoing prenatal care.  She is currently monitored for the following issues for this low-risk pregnancy and has Asthma; Poor dentition; Supervision of other normal pregnancy, antepartum; Late prenatal care affecting pregnancy in second trimester; Gastroesophageal reflux during pregnancy in second trimester, antepartum; and Hemorrhoids during pregnancy in third trimester on her problem list.  Patient reports no complaints.  Contractions: Irritability. Vag. Bleeding: None.  Movement: Present. Denies leaking of fluid.   The following portions of the patient's history were reviewed and updated as appropriate: allergies, current medications, past family history, past medical history, past social history, past surgical history and problem list. Problem list updated.  Objective:   Vitals:   12/22/16 1043  BP: 93/63  Pulse: 94  Weight: 142 lb 11.2 oz (64.7 kg)    Fetal Status: Fetal Heart Rate (bpm): 151 Fundal Height: 37 cm Movement: Present     General:  Alert, oriented and cooperative. Patient is in no acute distress.  Skin: Skin is warm and dry. No rash noted.   Cardiovascular: Normal heart rate noted  Respiratory: Normal respiratory effort, no problems with respiration noted  Abdomen: Soft, gravid, appropriate for gestational age. Pain/Pressure: Present     Pelvic:  Cervical exam deferred        Extremities: Normal range of motion.  Edema: None  Mental Status: Normal mood and affect. Normal behavior. Normal judgment and thought content.   Assessment and Plan:  Pregnancy: G3P2002 at 7972w6d  1. Supervision of other normal pregnancy, antepartum     Doing well.   Preterm labor symptoms and general obstetric precautions including but not limited to vaginal bleeding, contractions, leaking of fluid and fetal movement were reviewed in detail with the patient. Please refer to After Visit  Summary for other counseling recommendations.  Return in about 1 week (around 12/29/2016) for ROB.   Roe Coombsachelle A Denney, CNM

## 2016-12-29 ENCOUNTER — Inpatient Hospital Stay (HOSPITAL_COMMUNITY)
Admission: AD | Admit: 2016-12-29 | Discharge: 2016-12-29 | Disposition: A | Discharge status: Home / Self Care | Payer: Medicaid Other | Source: Ambulatory Visit | Attending: Obstetrics & Gynecology | Admitting: Obstetrics & Gynecology

## 2016-12-29 ENCOUNTER — Encounter (HOSPITAL_COMMUNITY): Payer: Self-pay | Admitting: *Deleted

## 2016-12-29 DIAGNOSIS — O2313 Infections of bladder in pregnancy, third trimester: Secondary | ICD-10-CM | POA: Diagnosis not present

## 2016-12-29 DIAGNOSIS — Z885 Allergy status to narcotic agent status: Secondary | ICD-10-CM | POA: Diagnosis not present

## 2016-12-29 DIAGNOSIS — Z3A37 37 weeks gestation of pregnancy: Secondary | ICD-10-CM | POA: Insufficient documentation

## 2016-12-29 DIAGNOSIS — N3000 Acute cystitis without hematuria: Secondary | ICD-10-CM

## 2016-12-29 LAB — URINALYSIS, ROUTINE W REFLEX MICROSCOPIC
BILIRUBIN URINE: NEGATIVE
GLUCOSE, UA: NEGATIVE mg/dL
KETONES UR: NEGATIVE mg/dL
Nitrite: POSITIVE — AB
PH: 6.5 (ref 5.0–8.0)
Protein, ur: NEGATIVE mg/dL
Specific Gravity, Urine: <1.005 — ABNORMAL LOW (ref 1.005–1.030)

## 2016-12-29 LAB — URINALYSIS, MICROSCOPIC (REFLEX)

## 2016-12-29 MED ORDER — CEFTRIAXONE SODIUM 1 G IJ SOLR
1.0000 g | Freq: Once | INTRAMUSCULAR | Status: AC
  Administered 2016-12-29: 1 g via INTRAVENOUS
  Filled 2016-12-29: qty 10

## 2016-12-29 MED ORDER — BUTORPHANOL TARTRATE 1 MG/ML IJ SOLN
2.0000 mg | Freq: Once | INTRAMUSCULAR | Status: AC
  Administered 2016-12-29: 2 mg via INTRAVENOUS
  Filled 2016-12-29: qty 2

## 2016-12-29 MED ORDER — CEPHALEXIN 500 MG PO CAPS: 500.0000 mg | ORAL_CAPSULE | Freq: Two times a day (BID) | ORAL | 0 refills | Status: AC

## 2016-12-29 MED ORDER — BUTORPHANOL TARTRATE 1 MG/ML IJ SOLN
2.0000 mg | Freq: Once | INTRAMUSCULAR | Status: DC
  Filled 2016-12-29: qty 2

## 2016-12-29 MED ORDER — PROMETHAZINE HCL 25 MG/ML IJ SOLN
12.5000 mg | Freq: Once | INTRAMUSCULAR | Status: AC
  Administered 2016-12-29: 12.5 mg via INTRAVENOUS
  Filled 2016-12-29: qty 1

## 2016-12-29 MED ORDER — LACTATED RINGERS IV BOLUS (SEPSIS)
1000.0000 mL | Freq: Once | INTRAVENOUS | Status: AC
  Administered 2016-12-29: 1000 mL via INTRAVENOUS

## 2016-12-29 MED ORDER — BUTORPHANOL TARTRATE 1 MG/ML IJ SOLN: 2.0000 mg | Freq: Once | INTRAMUSCULAR | Status: DC

## 2016-12-29 MED ORDER — BUTORPHANOL TARTRATE 1 MG/ML IJ SOLN
INTRAMUSCULAR | Status: AC
  Filled 2016-12-29: qty 1

## 2016-12-29 NOTE — Progress Notes (Addendum)
G3P2 @ 37+[redacted] wksga. Presents to triage for ctxs for past hr. Denies LOF or bleeding. +FM. VSS see flow sheet for VS. EFM applied.   SVE: 1/thick/high  0144: MD notified. Report status of pt given. Orders received for U/A  Cup and wipe provided to pt.   0240: SVE recheck:. 1.5/thick/-3  0244: Provider notified. Report status of pt given. Orders received to discharge pt home with instructions  0445: discharge instructions given with pt understanding. p left home via ambulatory with so.

## 2016-12-29 NOTE — MAU Note (Signed)
PT  SAYS [facility] HURT SINCE 9PM- [facility]- [facility]- VE   1 CM.           DENIES   HSV AND  MRSA.  GBS- NEG.

## 2016-12-29 NOTE — MAU Provider Note (Signed)
Chief Complaint:  Labor Eval  HPI: Marie Holt is a 26 y.o. G3P2002 at 8458w6d who presents to maternity admissions reporting contractions since 9pm.  She states that contractions have been progressively more painful and more frequent over the past several hours, which prompted her to come in for evaluation.  She reports that she's had vaginal discharge that she thought was an infection, but was told at her last visit that it was normal cervical mucous.  She denies dysuria, vaginal itching or burning. She does report urinary frequency.   Denies contractions, leakage of fluid or vaginal bleeding. Good fetal movement.   Pregnancy Course:   Past Medical History: Past Medical History:  Diagnosis Date  . Anxiety   . Asthma   . 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 Right ureteral stone   . Yeast infection     Past obstetric history: OB History  Gravida Para Term Preterm AB Living  3 2 2     2   SAB TAB Ectopic Multiple Live Births          2    # Outcome Date GA Lbr Len/2nd Weight Sex Delivery Anes PTL Lv  3 Current           2 Term 03/14/14 6254w3d 04:28 / 00:44 7 lb 2.3 oz (3.24 kg) F Vag-Spont EPI  LIV  1 Term 04/01/12 9527w5d 11:11 / 00:53 5 lb 10.3 oz (2.56 kg) F Vag-Spont EPI  LIV     Birth Comments: none      Past Surgical History: Past Surgical History:  Procedure Laterality Date  . CYSTOSCOPY W/ RETROGRADES  06/03/2012   Procedure: CYSTOSCOPY WITH RETROGRADE PYELOGRAM;  Surgeon: Valetta Fulleravid S Grapey, MD;  [facility];  Service: Urology;  Laterality: Right;     Family History: Family History  Problem Relation Age of Onset  . Hypertension Mother   . Cancer Father     lung  . Cancer Paternal Aunt     breast  . Von Willebrand disease Sister   . Hypertension Maternal Grandmother   . Cancer Paternal Grandmother     unknown origin  . Cancer Paternal Grandfather     unknown origin  . Anesthesia problems Neg Hx      Social History: Social History  Substance Use Topics  . Smoking status: Never Smoker  . Smokeless tobacco: Never Used  . Alcohol use No     Comment: occasional    Allergies:  Allergies  Allergen Reactions  . Vicodin [Hydrocodone-Acetaminophen] Nausea And Vomiting    Meds:  Prescriptions Prior to Admission  Medication Sig Dispense Refill Last Dose  . albuterol (PROVENTIL HFA;VENTOLIN HFA) 108 (90 Base) MCG/ACT inhaler Inhale 2 puffs into the lungs every 6 (six) hours as needed for wheezing or shortness of breath. 18 g 1 Taking  . cyclobenzaprine (FLEXERIL) 10 MG tablet Take 1 tablet (10 mg total) by mouth every 8 (eight) hours as needed for muscle spasms. 30 tablet 1 Taking  . omeprazole (PRILOSEC) 20 MG capsule Take 1 capsule (20 mg total) by mouth 2 (two) times daily before a meal. 60 capsule 5 Taking  . ondansetron (ZOFRAN) 8 MG tablet Take 1 tablet (8 mg total) by mouth every 8 (eight) hours as needed for nausea or vomiting. 40 tablet 2 Taking  . Prenat-FeCbn-FeAspGl-FA-Omega (OB COMPLETE PETITE) 35-5-1-200 MG CAPS Take 1 tablet by mouth daily. 30 capsule 12 Taking  . promethazine (PHENERGAN) 12.5  MG tablet   1 Not Taking  . ranitidine (ZANTAC) 150 MG tablet Take 1 tablet (150 mg total) by mouth 2 (two) times daily. 60 tablet 1 Taking    I have reviewed patient's Past Medical Hx, Surgical Hx, Family Hx, Social Hx, medications and allergies.   ROS:  A comprehensive ROS was negative except per HPI.    Physical Exam   Patient Vitals for the past 24 hrs:  BP Temp Temp src Pulse Resp SpO2 Height Weight  12/29/16 0434 110/78 97.8 F (36.6 C) Oral 72 16 99 % - -  12/29/16 0242 112/69 97.7 F (36.5 C) Oral 78 18 100 % - -  12/29/16 0110 99/73 97.7 F (36.5 C) Oral 70 20 - 5\' 2"  (1.575 m) 146 lb 4 oz (66.3 kg)   Constitutional: Well-developed, well-nourished female in moderate distress due to pain during contractions.  Cardiovascular: normal rate Respiratory: normal  effort GI: Abd soft, non-tender, gravid appropriate for gestational age. Pos BS x 4 Back: tender to palpation diffusely MS: Extremities nontender, no edema, normal ROM Neurologic: Alert and oriented x 4.  GU: Neg CVAT. Pelvic: NEFG, physiologic discharge, no blood, cervix clean. No CMT   FHT:  Baseline 120, moderate variability, accelerations present, no decelerations Contractions: q 2-3 mins   Labs: Results for orders placed or performed during the hospital encounter of 12/29/16 (from the past 24 hour(s))  Urinalysis, Routine w reflex microscopic     Status: Abnormal   Collection Time: 12/29/16  1:47 AM  Result Value Ref Range   Color, Urine YELLOW YELLOW   APPearance CLEAR CLEAR   Specific Gravity, Urine <1.005 (L) 1.005 - 1.030   pH 6.5 5.0 - 8.0   Glucose, UA NEGATIVE NEGATIVE mg/dL   Hgb urine dipstick SMALL (A) NEGATIVE   Bilirubin Urine NEGATIVE NEGATIVE   Ketones, ur NEGATIVE NEGATIVE mg/dL   Protein, ur NEGATIVE NEGATIVE mg/dL   Nitrite POSITIVE (A) NEGATIVE   Leukocytes, UA LARGE (A) NEGATIVE  Urinalysis, Microscopic (reflex)     Status: Abnormal   Collection Time: 12/29/16  1:47 AM  Result Value Ref Range   RBC / HPF 0-5 0 - 5 RBC/hpf   WBC, UA 6-30 0 - 5 WBC/hpf   Bacteria, UA MANY (A) NONE SEEN   Squamous Epithelial / LPF 0-5 (A) NONE SEEN    Imaging:  No results found.  MAU Course: CE at 0133 1/40/-3 with q81min contractions CE at 0240 1.5/40/-3 w q65min contractions UA ordered; positive for nitrites and large leuks Ceftriaxone, LR bolus, stadol ordered   MDM: Plan of care reviewed with patient, including labs and tests ordered and medical treatment.   Assessment: 1. Acute cystitis without hematuria     Plan: Discharge home in stable condition.  Cephalexin prescribed to complete 7 days Labor precautions and fetal kick counts  Follow-up Information    [facility] Follow up.   Why:  Go to next scheduled appointment Contact  information: [address] [place] [address] [phone]          Allergies as of 12/29/2016      Reactions   Vicodin [hydrocodone-acetaminophen] Nausea And Vomiting      Medication List    TAKE these medications   albuterol 108 (90 Base) MCG/ACT inhaler Commonly known as:  PROVENTIL HFA;VENTOLIN HFA Inhale 2 puffs into the lungs every 6 (six) hours as needed for wheezing or shortness of breath.   cephALEXin 500 MG capsule Commonly known as:  KEFLEX Take 1 capsule (500 mg total) by mouth 2 (two) times daily.   cyclobenzaprine 10 MG tablet Commonly known as:  FLEXERIL Take 1 tablet (10 mg total) by mouth every 8 (eight) hours as needed for muscle spasms.   OB COMPLETE PETITE 35-5-1-200 MG Caps Take 1 tablet by mouth daily.   omeprazole 20 MG capsule Commonly known as:  PRILOSEC Take 1 capsule (20 mg total) by mouth 2 (two) times daily before a meal.   ondansetron 8 MG tablet Commonly known as:  ZOFRAN Take 1 tablet (8 mg total) by mouth every 8 (eight) hours as needed for nausea or vomiting.   promethazine 12.5 MG tablet Commonly known as:  PHENERGAN   ranitidine 150 MG tablet Commonly known as:  ZANTAC Take 1 tablet (150 mg total) by mouth 2 (two) times daily.       Gwenevere Abbot, MD Family Medicine Resident, PGY-2 [facility] for [facility], [facility] 12/29/2016 4:42 AM   I confirm that I have verified the information documented in the resident's note and that I have also personally reperformed the physical exam and all medical decision making activities. Tawnya Crook  5:58 AM 12/29/16

## 2016-12-30 LAB — URINE CULTURE: Culture: NO GROWTH

## 2016-12-31 ENCOUNTER — Other Ambulatory Visit: Payer: Self-pay | Admitting: Certified Nurse Midwife

## 2016-12-31 DIAGNOSIS — K219 Gastro-esophageal reflux disease without esophagitis: Secondary | ICD-10-CM

## 2017-01-01 ENCOUNTER — Encounter: Payer: Self-pay | Admitting: Certified Nurse Midwife

## 2017-01-01 ENCOUNTER — Ambulatory Visit (INDEPENDENT_AMBULATORY_CARE_PROVIDER_SITE_OTHER): Payer: Medicaid Other | Admitting: Certified Nurse Midwife

## 2017-01-01 DIAGNOSIS — Z3483 Encounter for supervision of other normal pregnancy, third trimester: Secondary | ICD-10-CM

## 2017-01-01 DIAGNOSIS — Z3480 Encounter for supervision of other normal pregnancy, unspecified trimester: Secondary | ICD-10-CM

## 2017-01-01 NOTE — Progress Notes (Signed)
   PRENATAL VISIT NOTE  Subjective:  Marie Holt is a 26 y.o. G3P2002 at 6154w2d being seen today for ongoing prenatal care.  She is currently monitored for the following issues for this low-risk pregnancy and has Asthma; Poor dentition; Supervision of other normal pregnancy, antepartum; Late prenatal care affecting pregnancy in second trimester; Gastroesophageal reflux during pregnancy in second trimester, antepartum; and Hemorrhoids during pregnancy in third trimester on her problem list.  Patient reports no bleeding, no leaking and occasional contractions.  Contractions: Irregular. Vag. Bleeding: None.  Movement: Present. Denies leaking of fluid.   The following portions of the patient's history were reviewed and updated as appropriate: allergies, current medications, past family history, past medical history, past social history, past surgical history and problem list. Problem list updated.  Objective:   Vitals:   01/01/17 1115  BP: 101/67  Pulse: 93  Temp: 97.2 F (36.2 C)  Weight: 146 lb 3.2 oz (66.3 kg)    Fetal Status: Fetal Heart Rate (bpm): 136 Fundal Height: 37 cm Movement: Present  Presentation: Vertex  General:  Alert, oriented and cooperative. Patient is in no acute distress.  Skin: Skin is warm and dry. No rash noted.   Cardiovascular: Normal heart rate noted  Respiratory: Normal respiratory effort, no problems with respiration noted  Abdomen: Soft, gravid, appropriate for gestational age. Pain/Pressure: Present     Pelvic:  Cervical exam performed Dilation: 2 Effacement (%): 50 Station: -2  Extremities: Normal range of motion.  Edema: None  Mental Status: Normal mood and affect. Normal behavior. Normal judgment and thought content.   Assessment and Plan:  Pregnancy: G3P2002 at 10254w2d  1. Supervision of other normal pregnancy, antepartum     Doing well.    Term labor symptoms and general obstetric precautions including but not limited to vaginal bleeding,  contractions, leaking of fluid and fetal movement were reviewed in detail with the patient. Please refer to After Visit Summary for other counseling recommendations.  Return in about 1 week (around 01/08/2017) for ROB.   Roe Coombsachelle A Ridley Dileo, CNM

## 2017-01-01 NOTE — Progress Notes (Signed)
Patient reports irregular contractions, good fetal movement. 

## 2017-01-10 ENCOUNTER — Ambulatory Visit (INDEPENDENT_AMBULATORY_CARE_PROVIDER_SITE_OTHER): Payer: Medicaid Other | Admitting: Certified Nurse Midwife

## 2017-01-10 ENCOUNTER — Inpatient Hospital Stay (HOSPITAL_COMMUNITY)
Admission: AD | Admit: 2017-01-10 | Discharge: 2017-01-12 | DRG: 767 | Disposition: A | Discharge status: Home / Self Care | Payer: Medicaid Other | Source: Ambulatory Visit | Attending: Obstetrics and Gynecology | Admitting: Obstetrics and Gynecology

## 2017-01-10 ENCOUNTER — Encounter (HOSPITAL_COMMUNITY): Payer: Self-pay

## 2017-01-10 ENCOUNTER — Inpatient Hospital Stay (HOSPITAL_COMMUNITY): Payer: Medicaid Other | Admitting: Anesthesiology

## 2017-01-10 ENCOUNTER — Encounter: Payer: Self-pay | Admitting: Certified Nurse Midwife

## 2017-01-10 VITALS — BP 108/69 | HR 91 | Wt 146.2 lb

## 2017-01-10 DIAGNOSIS — Z302 Encounter for sterilization: Secondary | ICD-10-CM

## 2017-01-10 DIAGNOSIS — O9952 Diseases of the respiratory system complicating childbirth: Secondary | ICD-10-CM | POA: Diagnosis present

## 2017-01-10 DIAGNOSIS — O9962 Diseases of the digestive system complicating childbirth: Secondary | ICD-10-CM | POA: Diagnosis present

## 2017-01-10 DIAGNOSIS — O2243 Hemorrhoids in pregnancy, third trimester: Secondary | ICD-10-CM

## 2017-01-10 DIAGNOSIS — K219 Gastro-esophageal reflux disease without esophagitis: Secondary | ICD-10-CM | POA: Diagnosis not present

## 2017-01-10 DIAGNOSIS — Z9851 Tubal ligation status: Secondary | ICD-10-CM

## 2017-01-10 DIAGNOSIS — O0932 Supervision of pregnancy with insufficient antenatal care, second trimester: Secondary | ICD-10-CM

## 2017-01-10 DIAGNOSIS — J45909 Unspecified asthma, uncomplicated: Secondary | ICD-10-CM | POA: Diagnosis present

## 2017-01-10 DIAGNOSIS — O9902 Anemia complicating childbirth: Principal | ICD-10-CM | POA: Diagnosis present

## 2017-01-10 DIAGNOSIS — O0933 Supervision of pregnancy with insufficient antenatal care, third trimester: Secondary | ICD-10-CM

## 2017-01-10 DIAGNOSIS — O99612 Diseases of the digestive system complicating pregnancy, second trimester: Secondary | ICD-10-CM

## 2017-01-10 DIAGNOSIS — O99613 Diseases of the digestive system complicating pregnancy, third trimester: Secondary | ICD-10-CM

## 2017-01-10 DIAGNOSIS — Z8249 Family history of ischemic heart disease and other diseases of the circulatory system: Secondary | ICD-10-CM

## 2017-01-10 DIAGNOSIS — Z3A39 39 weeks gestation of pregnancy: Secondary | ICD-10-CM | POA: Diagnosis not present

## 2017-01-10 DIAGNOSIS — D649 Anemia, unspecified: Secondary | ICD-10-CM | POA: Diagnosis present

## 2017-01-10 DIAGNOSIS — Z3480 Encounter for supervision of other normal pregnancy, unspecified trimester: Secondary | ICD-10-CM

## 2017-01-10 DIAGNOSIS — Z3493 Encounter for supervision of normal pregnancy, unspecified, third trimester: Secondary | ICD-10-CM | POA: Diagnosis present

## 2017-01-10 LAB — CBC
HCT: 32.1 % — ABNORMAL LOW (ref 36.0–46.0)
Hemoglobin: 10.1 g/dL — ABNORMAL LOW (ref 12.0–15.0)
MCH: 27.4 pg (ref 26.0–34.0)
MCHC: 31.5 g/dL (ref 30.0–36.0)
MCV: 87 fL (ref 78.0–100.0)
Platelets: 265 10*3/uL (ref 150–400)
RBC: 3.69 MIL/uL — ABNORMAL LOW (ref 3.87–5.11)
RDW: 13.5 % (ref 11.5–15.5)
WBC: 9.7 10*3/uL (ref 4.0–10.5)

## 2017-01-10 MED ORDER — EPHEDRINE 5 MG/ML INJ
10.0000 mg | INTRAVENOUS | Status: DC | PRN
  Filled 2017-01-10: qty 2

## 2017-01-10 MED ORDER — ONDANSETRON HCL 4 MG/2ML IJ SOLN
4.0000 mg | Freq: Four times a day (QID) | INTRAMUSCULAR | Status: DC | PRN
Start: 2017-01-10 — End: 2017-01-11

## 2017-01-10 MED ORDER — PHENYLEPHRINE 40 MCG/ML (10ML) SYRINGE FOR IV PUSH (FOR BLOOD PRESSURE SUPPORT)
80.0000 ug | PREFILLED_SYRINGE | INTRAVENOUS | Status: DC | PRN
  Filled 2017-01-10: qty 5
  Filled 2017-01-10: qty 10

## 2017-01-10 MED ORDER — OXYCODONE-ACETAMINOPHEN 5-325 MG PO TABS
2.0000 | ORAL_TABLET | ORAL | Status: DC | PRN
Start: 2017-01-10 — End: 2017-01-11

## 2017-01-10 MED ORDER — OXYTOCIN 40 UNITS IN LACTATED RINGERS INFUSION - SIMPLE MED
2.5000 [IU]/h | INTRAVENOUS | Status: DC
  Filled 2017-01-10: qty 1000

## 2017-01-10 MED ORDER — PHENYLEPHRINE 40 MCG/ML (10ML) SYRINGE FOR IV PUSH (FOR BLOOD PRESSURE SUPPORT)
80.0000 ug | PREFILLED_SYRINGE | INTRAVENOUS | Status: DC | PRN
  Filled 2017-01-10: qty 5

## 2017-01-10 MED ORDER — LACTATED RINGERS IV SOLN: 500.0000 mL | INTRAVENOUS | Status: DC | PRN

## 2017-01-10 MED ORDER — OXYCODONE-ACETAMINOPHEN 5-325 MG PO TABS: 1.0000 | ORAL_TABLET | ORAL | Status: DC | PRN

## 2017-01-10 MED ORDER — LACTATED RINGERS IV SOLN
INTRAVENOUS | Status: DC
  Administered 2017-01-10 (×2): via INTRAVENOUS

## 2017-01-10 MED ORDER — DIPHENHYDRAMINE HCL 50 MG/ML IJ SOLN: 12.5000 mg | INTRAMUSCULAR | Status: DC | PRN

## 2017-01-10 MED ORDER — LIDOCAINE HCL (PF) 1 % IJ SOLN
30.0000 mL | INTRAMUSCULAR | Status: DC | PRN
  Filled 2017-01-10: qty 30

## 2017-01-10 MED ORDER — FENTANYL 2.5 MCG/ML BUPIVACAINE 1/10 % EPIDURAL INFUSION (WH - ANES)
14.0000 mL/h | INTRAMUSCULAR | Status: DC | PRN
  Administered 2017-01-10: 14 mL/h via EPIDURAL
  Filled 2017-01-10: qty 100

## 2017-01-10 MED ORDER — LIDOCAINE HCL (PF) 1 % IJ SOLN
INTRAMUSCULAR | Status: DC | PRN
  Administered 2017-01-10: 13 mL via EPIDURAL

## 2017-01-10 MED ORDER — NALBUPHINE HCL 10 MG/ML IJ SOLN: 5.0000 mg | INTRAMUSCULAR | Status: DC | PRN

## 2017-01-10 MED ORDER — OXYTOCIN BOLUS FROM INFUSION
500.0000 mL | Freq: Once | INTRAVENOUS | Status: AC
  Administered 2017-01-11: 500 mL via INTRAVENOUS

## 2017-01-10 MED ORDER — SOD CITRATE-CITRIC ACID 500-334 MG/5ML PO SOLN: 30.0000 mL | ORAL | Status: DC | PRN

## 2017-01-10 MED ORDER — ACETAMINOPHEN 325 MG PO TABS
650.0000 mg | ORAL_TABLET | ORAL | Status: DC | PRN
Start: 2017-01-10 — End: 2017-01-11

## 2017-01-10 MED ORDER — LACTATED RINGERS IV SOLN
500.0000 mL | Freq: Once | INTRAVENOUS | Status: AC
  Administered 2017-01-10: 500 mL via INTRAVENOUS

## 2017-01-10 NOTE — MAU Note (Signed)
Pt c/o contractions x2 hours now every 4-5 mins. Denies LOF or vag bleeding. +FM. Cervix was 2cm today.

## 2017-01-10 NOTE — Anesthesia Preprocedure Evaluation (Signed)
Anesthesia Evaluation  Patient identified by MRN, date of birth, ID band Patient awake    Reviewed: Allergy & Precautions, H&P , NPO status , Patient's Chart, lab work & pertinent test results, reviewed documented beta blocker date and time   Airway Mallampati: II  TM Distance: >3 FB Neck ROM: full    Dental no notable dental hx. (+) Teeth Intact, Dental Advidsory Given   Pulmonary asthma ,    Pulmonary exam normal breath sounds clear to auscultation       Cardiovascular Exercise Tolerance: Good negative cardio ROS   Rhythm:regular Rate:Normal     Neuro/Psych PSYCHIATRIC DISORDERS Anxiety negative neurological ROS     GI/Hepatic negative GI ROS, Neg liver ROS,   Endo/Other  negative endocrine ROS  Renal/GU      Musculoskeletal   Abdominal   Peds  Hematology negative hematology ROS (+)   Anesthesia Other Findings   Reproductive/Obstetrics (+) Pregnancy                             Anesthesia Physical  Anesthesia Plan  ASA: II  Anesthesia Plan: Epidural   Post-op Pain Management:    Induction:   Airway Management Planned:   Additional Equipment:   Intra-op Plan:   Post-operative Plan:   Informed Consent: I have reviewed the patients History and Physical, chart, labs and discussed the procedure including the risks, benefits and alternatives for the proposed anesthesia with the patient or authorized representative who has indicated his/her understanding and acceptance.     Plan Discussed with:   Anesthesia Plan Comments:         Anesthesia Quick Evaluation

## 2017-01-10 NOTE — Anesthesia Procedure Notes (Signed)
Epidural Patient location during procedure: OB Start time: 01/10/2017 11:03 PM End time: 01/10/2017 11:18 PM  Staffing Anesthesiologist: Anitra Lauth RAY Performed: anesthesiologist   Preanesthetic Checklist Completed: patient identified, site marked, surgical consent, pre-op evaluation, timeout performed, IV checked, risks and benefits discussed and monitors and equipment checked  Epidural Patient position: sitting Prep: DuraPrep Patient monitoring: heart rate, cardiac monitor, continuous pulse ox and blood pressure Approach: midline Location: L2-L3 Injection technique: LOR saline  Needle:  Needle type: Tuohy  Needle gauge: 17 G Needle length: 9 cm Needle insertion depth: 5 cm Catheter type: closed end flexible Catheter size: 20 Guage Catheter at skin depth: 8 cm Test dose: negative  Assessment Events: blood not aspirated, injection not painful, no injection resistance, negative IV test and no paresthesia  Additional Notes Reason for block:procedure for pain

## 2017-01-10 NOTE — H&P (Signed)
LABOR ADMISSION HISTORY AND PHYSICAL  Marie Holt is a 26 y.o. female G79P2002 with IUP at [redacted]w[redacted]d by 6w [place] presenting for labor pain. She reports regular contraction every 5 minutes. Fetal movement present and at baseline. Denies fluid leak or gush, vaginal bleeding,  headaches, blurry vision, RUQ pain or peripheral edema. She plans on bottle feeding. She request BTL for birth control.  She has already signed papers. Plans on bringing child to Dr. Toula Moos office for pediatric care after discharge.   Dating: By Stefan Church [place] --->  Estimated Date of Delivery: 01/13/17  Sono:    , CWD, normal anatomy, 321g, ??% EFW   Prenatal History/Complications:  Past Medical History: Past Medical History:  Diagnosis Date  . Anxiety   . Asthma   . 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 Right ureteral stone   . Yeast infection     Past Surgical History: Past Surgical History:  Procedure Laterality Date  . CYSTOSCOPY W/ RETROGRADES  06/03/2012   Procedure: CYSTOSCOPY WITH RETROGRADE PYELOGRAM;  Surgeon: Valetta Fuller, MD;  Location: [facility];  Service: Urology;  Laterality: Right;    Obstetrical History: OB History    Gravida Para Term Preterm AB Living   SAB TAB Ectopic Multiple Live Births           2      Social History: Social History   Social History  . Marital status: Single    Spouse name: N/A  . Number of children: N/A  . Years of education: N/A   Social History Main Topics  . Smoking status: Never Smoker  . Smokeless tobacco: Never Used  . Alcohol use No     Comment: occasional  . Drug use: No     Comment: denies any usuage  . Sexual activity: Yes    Birth control/ protection: None   Other Topics Concern  . None   Social History Narrative  . None    Family History: Family History  Problem Relation Age of Onset  . Hypertension Mother   . Cancer Father     lung  . Cancer Paternal Aunt      breast  . Von Willebrand disease Sister   . Hypertension Maternal Grandmother   . Cancer Paternal Grandmother     unknown origin  . Cancer Paternal Grandfather     unknown origin  . Anesthesia problems Neg Hx     Allergies: Allergies  Allergen Reactions  . Vicodin [Hydrocodone-Acetaminophen] Nausea And Vomiting    Prescriptions Prior to Admission  Medication Sig Dispense Refill Last Dose  . albuterol (PROVENTIL HFA;VENTOLIN HFA) 108 (90 Base) MCG/ACT inhaler Inhale 2 puffs into the lungs every 6 (six) hours as needed for wheezing or shortness of breath. (Patient not taking: Reported on 01/01/2017) 18 g 1 Not Taking  . omeprazole (PRILOSEC) 20 MG capsule TAKE 1 CAPSULE(20 MG) BY MOUTH TWICE DAILY BEFORE A MEAL 180 capsule 3 Taking  . Prenat-FeCbn-FeAspGl-FA-Omega (OB COMPLETE PETITE) 35-5-1-200 MG CAPS Take 1 tablet by mouth daily. 30 capsule 12 Taking  . promethazine (PHENERGAN) 12.5 MG tablet   1 Not Taking     Review of Systems  Blood pressure 103/68, pulse 79, temperature 97.8 F (36.6 C), temperature source Oral, resp. rate 16, height  (1.575 m), weight 146 lb (66.2 kg), last menstrual period 02/09/2016, SpO2 100 %, unknown if currently breastfeeding.  GEN: appearance: alert, cooperative and appears stated age RESP: clear to auscultation bilaterally, no increased WOB CVS:: regular rate and rhythm, no murmurs, no sign of DVT, +2 DP GI: soft, non-tender; bowel sounds normal MSK: WWP, Homans sign is negative,  NEURO: no gross deficit PSYCH:  Pelvic Exam: Cervical exam: Dilation: 6.5 Effacement (%): 80 Station: -1 Exam by:: Auriel RN  Presentation: cephalic per RN Uterine activityDate/time of onset: 01/10/2017 at 1800, Frequency: Every 5 minutes, Duration: 30 seconds and Intensity: moderate  Fetal monitoringBaseline: 140 bpm, Variability: Good {> 6 bpm) and Accelerations: Reactive  Prenatal labs: ABO, Rh: O/Positive/-- (10/17 1052) Antibody: Negative (10/17  1052) Rubella: !Error! immune RPR: Non Reactive (01/04 1100)  HBsAg: Negative (10/17 1052)  HIV: Non Reactive (01/04 1100)  GBS: Negative (03/07 1555)  1 hr Glucola: normal Genetic screening  normal Anatomy US normal  Prenatal Transfer Tool  Maternal Diabetes: No Genetic Screening: Normal Maternal Ultrasounds/Referrals: Normal Fetal Ultrasounds or other Referrals:  None Maternal Substance Abuse:  Yes:  Type: Marijuana Significant Maternal Medications:  Meds include: Other: albuterol Significant Maternal Lab Results: None  Results for orders placed or performed during the hospital encounter of 01/10/17 (from the past 24 hour(s))  CBC   Collection Time: 01/10/17 10:15 PM  Result Value Ref Range   WBC 9.7 4.0 - 10.5 K/uL   RBC 3.69 (L) 3.87 - 5.11 MIL/uL   Hemoglobin 10.1 (L) 12.0 - 15.0 g/dL   HCT 89.3 (L) 81.0 - 17.5 %   MCV 87.0 78.0 - 100.0 fL   MCH 27.4 26.0 - 34.0 pg   MCHC 31.5 30.0 - 36.0 g/dL   RDW 10.2 58.5 - 27.7 %   Platelets 265 150 - 400 K/uL    Patient Active Problem List   Diagnosis Date Noted  . Normal labor 01/10/2017  . Hemorrhoids during pregnancy in third trimester 12/06/2016  . Gastroesophageal reflux during pregnancy in second trimester, antepartum 08/17/2016  . Supervision of other normal pregnancy, antepartum 07/25/2016  . Late prenatal care affecting pregnancy in second trimester 07/25/2016  . Asthma 01/29/2012  . Poor dentition 01/29/2012    Assessment: Marie Holt is a 26 y.o. G3P2002 at [redacted]w[redacted]d here for SOL  #Labor: expectant #Pain: epidural #FWB: CAT-1 #ID: GBS negative #MOF: bottle #MOC: BTL. Already signed papers #Circ: outpatient  Almon Hercules 01/10/2017, 11:58 PM  I confirm that I have verified the information documented in the resident's note and that I have also personally performed the physical exam and all medical decision making activities.   Luna Kitchens CNM

## 2017-01-10 NOTE — Progress Notes (Signed)
Patient reports she is having contractions daily- but nothing strong enough to go to the hospital

## 2017-01-10 NOTE — Progress Notes (Signed)
   PRENATAL VISIT NOTE  Subjective:  Marie Holt is a 26 y.o. G3P2002 at [redacted]w[redacted]d being seen today for ongoing prenatal care.  She is currently monitored for the following issues for this low-risk pregnancy and has Asthma; Poor dentition; Supervision of other normal pregnancy, antepartum; Late prenatal care affecting pregnancy in second trimester; Gastroesophageal reflux during pregnancy in second trimester, antepartum; and Hemorrhoids during pregnancy in third trimester on her problem list.  Patient reports no complaints.  Contractions: Irregular. Vag. Bleeding: None.  Movement: Present. Denies leaking of fluid.   The following portions of the patient's history were reviewed and updated as appropriate: allergies, current medications, past family history, past medical history, past social history, past surgical history and problem list. Problem list updated.  Objective:   Vitals:   01/10/17 1011  BP: 108/69  Pulse: 91  Weight: 146 lb 3.2 oz (66.3 kg)    Fetal Status: Fetal Heart Rate (bpm): 141 Fundal Height: 38 cm Movement: Present  Presentation: Vertex  General:  Alert, oriented and cooperative. Patient is in no acute distress.  Skin: Skin is warm and dry. No rash noted.   Cardiovascular: Normal heart rate noted  Respiratory: Normal respiratory effort, no problems with respiration noted  Abdomen: Soft, gravid, appropriate for gestational age. Pain/Pressure: Present     Pelvic:  Cervical exam performed Dilation: 2.5 Effacement (%): 50 Station: -2  Extremities: Normal range of motion.  Edema: None  Mental Status: Normal mood and affect. Normal behavior. Normal judgment and thought content.   Assessment and Plan:  Pregnancy: G3P2002 at [redacted]w[redacted]d  1. Hemorrhoids during pregnancy in third trimester       2. Gastroesophageal reflux during pregnancy in second trimester, antepartum     Taking Prilosec   3. Late prenatal care affecting pregnancy in second trimester      weeks  4.  Supervision of other normal pregnancy, antepartum      Doing well  Term labor symptoms and general obstetric precautions including but not limited to vaginal bleeding, contractions, leaking of fluid and fetal movement were reviewed in detail with the patient. Please refer to After Visit Summary for other counseling recommendations.  Return in about 1 week (around 01/17/2017) for ROB, NST.   Roe Coombs, CNM

## 2017-01-11 ENCOUNTER — Inpatient Hospital Stay (HOSPITAL_COMMUNITY): Payer: Medicaid Other | Admitting: Anesthesiology

## 2017-01-11 ENCOUNTER — Encounter (HOSPITAL_COMMUNITY): Payer: Self-pay

## 2017-01-11 ENCOUNTER — Encounter (HOSPITAL_COMMUNITY)
Admission: AD | Disposition: A | Discharge status: Home / Self Care | Payer: Self-pay | Source: Ambulatory Visit | Attending: Obstetrics and Gynecology

## 2017-01-11 DIAGNOSIS — Z3A39 39 weeks gestation of pregnancy: Secondary | ICD-10-CM

## 2017-01-11 DIAGNOSIS — Z302 Encounter for sterilization: Secondary | ICD-10-CM

## 2017-01-11 DIAGNOSIS — Z9851 Tubal ligation status: Secondary | ICD-10-CM

## 2017-01-11 HISTORY — PX: TUBAL LIGATION: SHX77

## 2017-01-11 LAB — CBC
HEMATOCRIT: 30.9 % — AB (ref 36.0–46.0)
Hemoglobin: 9.9 g/dL — ABNORMAL LOW (ref 12.0–15.0)
MCH: 27.7 pg (ref 26.0–34.0)
MCHC: 32 g/dL (ref 30.0–36.0)
MCV: 86.3 fL (ref 78.0–100.0)
Platelets: 217 10*3/uL (ref 150–400)
RBC: 3.58 MIL/uL — AB (ref 3.87–5.11)
RDW: 13.4 % (ref 11.5–15.5)
WBC: 13.6 10*3/uL — ABNORMAL HIGH (ref 4.0–10.5)

## 2017-01-11 LAB — TYPE AND SCREEN
ABO/RH(D): O POS
Antibody Screen: NEGATIVE

## 2017-01-11 SURGERY — LIGATION, FALLOPIAN TUBE, POSTPARTUM
Anesthesia: General | Laterality: Bilateral

## 2017-01-11 MED ORDER — ONDANSETRON HCL 4 MG PO TABS
4.0000 mg | ORAL_TABLET | ORAL | Status: DC | PRN
Start: 2017-01-11 — End: 2017-01-12

## 2017-01-11 MED ORDER — MEPERIDINE HCL 25 MG/ML IJ SOLN: 6.2500 mg | INTRAMUSCULAR | Status: DC | PRN

## 2017-01-11 MED ORDER — IBUPROFEN 600 MG PO TABS
600.0000 mg | ORAL_TABLET | Freq: Four times a day (QID) | ORAL | Status: DC
  Administered 2017-01-11 – 2017-01-12 (×5): 600 mg via ORAL
  Filled 2017-01-11 (×5): qty 1

## 2017-01-11 MED ORDER — BUPIVACAINE HCL 0.5 % IJ SOLN
INTRAMUSCULAR | Status: DC | PRN
  Administered 2017-01-11: 30 mL

## 2017-01-11 MED ORDER — SIMETHICONE 80 MG PO CHEW: 80.0000 mg | CHEWABLE_TABLET | ORAL | Status: DC | PRN

## 2017-01-11 MED ORDER — FENTANYL CITRATE (PF) 100 MCG/2ML IJ SOLN
INTRAMUSCULAR | Status: DC | PRN
  Administered 2017-01-11: 100 ug via INTRAVENOUS
  Administered 2017-01-11: 50 ug via INTRAVENOUS

## 2017-01-11 MED ORDER — BUPIVACAINE HCL (PF) 0.5 % IJ SOLN
INTRAMUSCULAR | Status: AC
  Filled 2017-01-11: qty 30

## 2017-01-11 MED ORDER — LACTATED RINGERS IV SOLN
INTRAVENOUS | Status: DC
  Administered 2017-01-11: 09:00:00 via INTRAVENOUS

## 2017-01-11 MED ORDER — DIPHENHYDRAMINE HCL 25 MG PO CAPS: 25.0000 mg | ORAL_CAPSULE | Freq: Four times a day (QID) | ORAL | Status: DC | PRN

## 2017-01-11 MED ORDER — FENTANYL CITRATE (PF) 100 MCG/2ML IJ SOLN
INTRAMUSCULAR | Status: AC
  Filled 2017-01-11: qty 2

## 2017-01-11 MED ORDER — PRENATAL MULTIVITAMIN CH
1.0000 | ORAL_TABLET | Freq: Every day | ORAL | Status: DC
  Administered 2017-01-12: 1 via ORAL
  Filled 2017-01-11: qty 1

## 2017-01-11 MED ORDER — ALBUTEROL SULFATE (2.5 MG/3ML) 0.083% IN NEBU: 3.0000 mL | INHALATION_SOLUTION | RESPIRATORY_TRACT | Status: DC | PRN

## 2017-01-11 MED ORDER — DEXAMETHASONE SODIUM PHOSPHATE 10 MG/ML IJ SOLN
INTRAMUSCULAR | Status: DC | PRN
  Administered 2017-01-11: 4 mg via INTRAVENOUS

## 2017-01-11 MED ORDER — PROPOFOL 10 MG/ML IV BOLUS
INTRAVENOUS | Status: AC
  Filled 2017-01-11: qty 20

## 2017-01-11 MED ORDER — SENNOSIDES-DOCUSATE SODIUM 8.6-50 MG PO TABS
2.0000 | ORAL_TABLET | ORAL | Status: DC
  Administered 2017-01-11: 2 via ORAL
  Filled 2017-01-11: qty 2

## 2017-01-11 MED ORDER — KETOROLAC TROMETHAMINE 30 MG/ML IJ SOLN
INTRAMUSCULAR | Status: DC | PRN
  Administered 2017-01-11: 30 mg via INTRAVENOUS

## 2017-01-11 MED ORDER — METOCLOPRAMIDE HCL 10 MG PO TABS
10.0000 mg | ORAL_TABLET | Freq: Once | ORAL | Status: AC
  Administered 2017-01-11: 10 mg via ORAL
  Filled 2017-01-11: qty 1

## 2017-01-11 MED ORDER — ZOLPIDEM TARTRATE 5 MG PO TABS: 5.0000 mg | ORAL_TABLET | Freq: Every evening | ORAL | Status: DC | PRN

## 2017-01-11 MED ORDER — ROCURONIUM BROMIDE 100 MG/10ML IV SOLN
INTRAVENOUS | Status: DC | PRN
  Administered 2017-01-11: 30 mg via INTRAVENOUS

## 2017-01-11 MED ORDER — SUGAMMADEX SODIUM 200 MG/2ML IV SOLN
INTRAVENOUS | Status: DC | PRN
  Administered 2017-01-11: 135 mg via INTRAVENOUS

## 2017-01-11 MED ORDER — OXYCODONE HCL 5 MG PO TABS
5.0000 mg | ORAL_TABLET | ORAL | Status: DC | PRN
  Administered 2017-01-11 – 2017-01-12 (×4): 5 mg via ORAL
  Filled 2017-01-11 (×4): qty 1

## 2017-01-11 MED ORDER — MIDAZOLAM HCL 2 MG/2ML IJ SOLN
INTRAMUSCULAR | Status: AC
  Filled 2017-01-11: qty 2

## 2017-01-11 MED ORDER — FAMOTIDINE 20 MG PO TABS
40.0000 mg | ORAL_TABLET | Freq: Once | ORAL | Status: AC
  Administered 2017-01-11: 40 mg via ORAL
  Filled 2017-01-11: qty 2

## 2017-01-11 MED ORDER — 0.9 % SODIUM CHLORIDE (POUR BTL) OPTIME
TOPICAL | Status: DC | PRN
Start: 2017-01-11 — End: 2017-01-11
  Administered 2017-01-11: 1000 mL

## 2017-01-11 MED ORDER — TETANUS-DIPHTH-ACELL PERTUSSIS 5-2.5-18.5 LF-MCG/0.5 IM SUSP: 0.5000 mL | Freq: Once | INTRAMUSCULAR | Status: DC

## 2017-01-11 MED ORDER — METOCLOPRAMIDE HCL 5 MG/ML IJ SOLN: 10.0000 mg | Freq: Once | INTRAMUSCULAR | Status: DC | PRN

## 2017-01-11 MED ORDER — WITCH HAZEL-GLYCERIN EX PADS: 1.0000 "application " | MEDICATED_PAD | CUTANEOUS | Status: DC | PRN

## 2017-01-11 MED ORDER — BENZOCAINE-MENTHOL 20-0.5 % EX AERO: 1.0000 "application " | INHALATION_SPRAY | CUTANEOUS | Status: DC | PRN

## 2017-01-11 MED ORDER — HYDROMORPHONE HCL 1 MG/ML IJ SOLN
0.2500 mg | INTRAMUSCULAR | Status: DC | PRN
  Administered 2017-01-11 (×2): 0.5 mg via INTRAVENOUS

## 2017-01-11 MED ORDER — MIDAZOLAM HCL 2 MG/2ML IJ SOLN
INTRAMUSCULAR | Status: DC | PRN
  Administered 2017-01-11: 2 mg via INTRAVENOUS

## 2017-01-11 MED ORDER — DEXAMETHASONE SODIUM PHOSPHATE 4 MG/ML IJ SOLN
INTRAMUSCULAR | Status: AC
  Filled 2017-01-11: qty 1

## 2017-01-11 MED ORDER — COCONUT OIL OIL: 1.0000 "application " | TOPICAL_OIL | Status: DC | PRN

## 2017-01-11 MED ORDER — ONDANSETRON HCL 4 MG/2ML IJ SOLN
INTRAMUSCULAR | Status: AC
  Filled 2017-01-11: qty 2

## 2017-01-11 MED ORDER — ROCURONIUM BROMIDE 100 MG/10ML IV SOLN
INTRAVENOUS | Status: AC
  Filled 2017-01-11: qty 1

## 2017-01-11 MED ORDER — PROPOFOL 500 MG/50ML IV EMUL
INTRAVENOUS | Status: DC | PRN
  Administered 2017-01-11: 150 mg via INTRAVENOUS

## 2017-01-11 MED ORDER — ONDANSETRON HCL 4 MG/2ML IJ SOLN: 4.0000 mg | INTRAMUSCULAR | Status: DC | PRN

## 2017-01-11 MED ORDER — DIBUCAINE 1 % RE OINT: 1.0000 "application " | TOPICAL_OINTMENT | RECTAL | Status: DC | PRN

## 2017-01-11 MED ORDER — HYDROMORPHONE HCL 1 MG/ML IJ SOLN
INTRAMUSCULAR | Status: AC
  Filled 2017-01-11: qty 1

## 2017-01-11 SURGICAL SUPPLY — 28 items
APL SKNCLS STERI-STRIP NONHPOA (GAUZE/BANDAGES/DRESSINGS) ×1
BENZOIN TINCTURE PRP APPL 2/3 (GAUZE/BANDAGES/DRESSINGS) ×3 IMPLANT
CLIP FILSHIE TUBAL LIGA STRL (Clip) ×2 IMPLANT
CLOSURE WOUND 1/2 X4 (GAUZE/BANDAGES/DRESSINGS) ×1
CLOTH BEACON ORANGE TIMEOUT ST (SAFETY) ×3 IMPLANT
DRSG OPSITE POSTOP 3X4 (GAUZE/BANDAGES/DRESSINGS) ×3 IMPLANT
DURAPREP 26ML APPLICATOR (WOUND CARE) ×3 IMPLANT
GLOVE BIOGEL PI IND STRL 6.5 (GLOVE) IMPLANT
GLOVE BIOGEL PI IND STRL 7.0 (GLOVE) ×3 IMPLANT
GLOVE BIOGEL PI INDICATOR 6.5 (GLOVE) ×6
GLOVE BIOGEL PI INDICATOR 7.0 (GLOVE) ×6
GLOVE ECLIPSE 6.0 STRL STRAW (GLOVE) ×4 IMPLANT
GLOVE ECLIPSE 7.0 STRL STRAW (GLOVE) ×3 IMPLANT
GOWN PREVENTION PLUS LG XLONG (DISPOSABLE) ×4 IMPLANT
GOWN STRL REUS W/TWL LRG LVL3 (GOWN DISPOSABLE) ×6 IMPLANT
GOWN STRL REUS W/TWL XL LVL3 (GOWN DISPOSABLE) ×3 IMPLANT
NEEDLE HYPO 22GX1.5 SAFETY (NEEDLE) ×3 IMPLANT
NS IRRIG 1000ML POUR BTL (IV SOLUTION) ×3 IMPLANT
PACK ABDOMINAL MINOR (CUSTOM PROCEDURE TRAY) ×3 IMPLANT
PROTECTOR NERVE ULNAR (MISCELLANEOUS) ×3 IMPLANT
SPONGE LAP 4X18 X RAY DECT (DISPOSABLE) ×2 IMPLANT
STRIP CLOSURE SKIN 1/2X4 (GAUZE/BANDAGES/DRESSINGS) ×2 IMPLANT
SUT VIC AB 0 CT1 27 (SUTURE) ×3
SUT VIC AB 0 CT1 27XBRD ANBCTR (SUTURE) ×1 IMPLANT
SUT VIC AB 4-0 PS2 27 (SUTURE) ×3 IMPLANT
SYR CONTROL 10ML LL (SYRINGE) ×3 IMPLANT
TOWEL OR 17X24 6PK STRL BLUE (TOWEL DISPOSABLE) ×6 IMPLANT
TRAY FOLEY CATH 16FR SILVER (SET/KITS/TRAYS/PACK) ×3 IMPLANT

## 2017-01-11 NOTE — Progress Notes (Signed)
Patient ID: Marie Holt, female   DOB: 1991/03/07, 26 y.o.   MRN: [id number] Patient desires surgical management with Postpartum bilateral tubal ligation.  The risks of surgery were discussed in detail with the patient including but not limited to: bleeding which may require transfusion or reoperation; infection which may require prolonged hospitalization or re-hospitalization and antibiotic therapy; injury to bowel, bladder, ureters and major vessels or other surrounding organs; need for additional procedures including laparotomy; thromboembolic phenomenon, incisional problems and other postoperative or anesthesia complications.  Patient was told that the failure rate is 3-01/999  the postoperative expectations were also discussed in detail. The patient also understands the alternative treatment options which were discussed in full. All questions were answered.    [name] L. Harraway-Smith, M.D., [facility]

## 2017-01-11 NOTE — Anesthesia Postprocedure Evaluation (Signed)
Anesthesia Post Note  Patient: Marie Holt  Procedure(s) Performed: Procedure(s) (LRB): POST PARTUM TUBAL LIGATION (Bilateral)  Patient location during evaluation: PACU Anesthesia Type: General Level of consciousness: awake and alert and oriented Pain management: pain level controlled Vital Signs Assessment: post-procedure vital signs reviewed and stable Respiratory status: spontaneous breathing, nonlabored ventilation and respiratory function stable Cardiovascular status: blood pressure returned to baseline and stable Postop Assessment: no signs of nausea or vomiting Anesthetic complications: no        Last Vitals:  Vitals:   01/11/17 1200 01/11/17 1300  BP: 120/72 99/62  Pulse: (!) 55 (!) 50  Resp: 19 16  Temp: 36.7 C 36.8 C    Last Pain:  Vitals:   01/11/17 1400  TempSrc:   PainSc: 0-No pain   Pain Goal: Patients Stated Pain Goal: 3 (01/11/17 1200)               Lyndie Vanderloop A.

## 2017-01-11 NOTE — Anesthesia Procedure Notes (Signed)
Date/Time: 01/11/2017 9:54 AM Performed by: Jahmeer Porche, Jannet Askew

## 2017-01-11 NOTE — Op Note (Signed)
prev done

## 2017-01-11 NOTE — Plan of Care (Signed)
Problem: Pain Managment: Goal: General experience of comfort will improve Post op pain being managed well with oxycodone and Ibuprofen. Patient ambulating well in room and plans to take a shower later this evening.

## 2017-01-11 NOTE — Progress Notes (Signed)
Delivery Note At 1:45 AM a viable female was delivered via Vaginal, Spontaneous Delivery (Presentation:ROA;  ). Infant placed on maternal abdomen; dried and stimulated.  APGAR: 7, 9; weight pending .   Placenta status: devliered with gentle traction; 3 vc.  Cord:  with the following complications: none.   Anesthesia:  epidural Episiotomy: None Lacerations: None Est. Blood Loss (mL): 150  Mom to postpartum.  Baby to Couplet care / Skin to Skin.  Charlesetta Garibaldi Kooistra 01/11/2017, 2:05 AM

## 2017-01-11 NOTE — Brief Op Note (Signed)
01/10/2017 - 01/11/2017  11:20 AM  PATIENT:  Marie Holt  26 y.o. female  PRE-OPERATIVE DIAGNOSIS:  undesired fertility  POST-OPERATIVE DIAGNOSIS:  undesired fertility  PROCEDURE:  Procedure(s): POST PARTUM TUBAL LIGATION (Bilateral)  SURGEON:  Surgeon(s) and Role:    * Willodean Rosenthal, MD - Primary  ASSIST: Jen Mow, MD- fellow  ANESTHESIA:   general  EBL:  Total I/O In: 1000 [I.V.:1000] Out: -   BLOOD ADMINISTERED:none  DRAINS: none   LOCAL MEDICATIONS USED:  MARCAINE     SPECIMEN:  No Specimen  DISPOSITION OF SPECIMEN:  N/A  COUNTS:  YES  TOURNIQUET:  * No tourniquets in log *  DICTATION: .Note written in EPIC  PLAN OF CARE: Patient has active admit order  PATIENT DISPOSITION:  PACU - hemodynamically stable.   Delay start of Pharmacological VTE agent (>24hrs) due to surgical blood loss or risk of bleeding: yes  Complications: none immediate  Famous Eisenhardt L. Harraway-Smith, M.D., Evern Core

## 2017-01-11 NOTE — Transfer of Care (Signed)
Immediate Anesthesia Transfer of Care Note  Patient: Marie Holt  Procedure(s) Performed: Procedure(s): POST PARTUM TUBAL LIGATION (Bilateral)  Patient Location: PACU  Anesthesia Type:General  Level of Consciousness: awake, alert  and oriented  Airway & Oxygen Therapy: Patient Spontanous Breathing and Patient connected to nasal cannula oxygen  Post-op Assessment: Report given to RN and Post -op Vital signs reviewed and stable  Post vital signs: Reviewed and stable  Last Vitals:  Vitals:   01/11/17 0420 01/11/17 0820  BP: 101/65 106/63  Pulse: 65 (!) 57  Resp: 18 16  Temp: 36.8 C 36.9 C    Last Pain:  Vitals:   01/11/17 0820  TempSrc: Oral  PainSc:          Complications: No apparent anesthesia complications

## 2017-01-11 NOTE — Anesthesia Postprocedure Evaluation (Signed)
Anesthesia Post Note  Patient: Marie Holt  Procedure(s) Performed: * No procedures listed *  Patient location during evaluation: Mother Baby Anesthesia Type: Epidural Level of consciousness: awake and alert Pain management: satisfactory to patient Vital Signs Assessment: post-procedure vital signs reviewed and stable Respiratory status: respiratory function stable Cardiovascular status: stable Postop Assessment: no headache, no backache, epidural receding, patient able to bend at knees, no signs of nausea or vomiting and adequate PO intake Anesthetic complications: no        Last Vitals:  Vitals:   01/11/17 0320 01/11/17 0420  BP: 107/61 101/65  Pulse: 64 65  Resp: 18 18  Temp: 36.6 C 36.8 C    Last Pain:  Vitals:   01/11/17 0420  TempSrc: Oral  PainSc: 6    Pain Goal:                 Cianna Kasparian

## 2017-01-11 NOTE — Anesthesia Procedure Notes (Signed)
Procedure Name: Intubation Date/Time: 01/11/2017 9:34 AM Performed by: Amit Leece, Sheron Nightingale Pre-anesthesia Checklist: Patient identified, Emergency Drugs available, Suction available, Patient being monitored and Timeout performed Patient Re-evaluated:Patient Re-evaluated prior to inductionOxygen Delivery Method: Circle system utilized Preoxygenation: Pre-oxygenation with 100% oxygen Intubation Type: IV induction and Cricoid Pressure applied Ventilation: Mask ventilation without difficulty Laryngoscope Size: Mac and 3 Grade View: Grade I Tube type: Oral Tube size: 7.0 mm Number of attempts: 1 Airway Equipment and Method: Stylet Placement Confirmation: ETT inserted through vocal cords under direct vision,  positive ETCO2 and breath sounds checked- equal and bilateral Secured at: 20 cm Tube secured with: Tape

## 2017-01-11 NOTE — Op Note (Signed)
01/10/2017 - 01/11/2017  11:20 AM  PATIENT:  Marie Holt  26 y.o. female  PRE-OPERATIVE DIAGNOSIS:  undesired fertility  POST-OPERATIVE DIAGNOSIS:  undesired fertility  PROCEDURE:  Procedure(s): POST PARTUM TUBAL LIGATION (Bilateral)  SURGEON:  Surgeon(s) and Role:    * Willodean Rosenthal, MD - Primary  ASSIST: Jen Mow, MD- fellow  ANESTHESIA:   general  EBL:  Total I/O In: 1000 [I.V.:1000] Out: -   BLOOD ADMINISTERED:none  DRAINS: none   LOCAL MEDICATIONS USED:  MARCAINE     SPECIMEN:  No Specimen  DISPOSITION OF SPECIMEN:  N/A  COUNTS:  YES  TOURNIQUET:  * No tourniquets in log *  DICTATION: .Note written in EPIC  PLAN OF CARE: Patient has active admit order  PATIENT DISPOSITION:  PACU - hemodynamically stable.   Delay start of Pharmacological VTE agent (>24hrs) due to surgical blood loss or risk of bleeding: yes  Complications: none immediate  INDICATIONS: 26 y.o. [id number]  with undesired fertility,status post vaginal delivery, desires permanent sterilization.  Other reversible forms of contraception were discussed with patient; she declines all other modalities. Risks of procedure discussed with patient including but not limited to: risk of regret, permanence of method, bleeding, infection, injury to surrounding organs and need for additional procedures.  Failure risk of 0.5-1% with increased risk of ectopic gestation if pregnancy occurs was also discussed with patient.     FINDINGS:  Normal uterus, tubes, and ovaries.  PROCEDURE DETAILS: The patient was taken to the operating room where her epidural anesthesia was dosed up to surgical level and found to be adequate.  She was then placed in the dorsal supine position and prepped and draped in sterile fashion.  After an adequate timeout was performed, attention was turned to the patient's abdomen where a small transverse skin incision was made under the umbilical fold. The incision was taken down to  the layer of fascia using the scalpel, and fascia was incised, and extended bilaterally using Mayo scissors. The peritoneum was entered in a sharp fashion. Attention was then turned to the patient's uterus, and left fallopian tube was identified and followed out to the fimbriated end.  A Filshie clip was placed on the left fallopian tube about 3 cm from the cornual attachment, with care given to incorporate the underlying mesosalpinx.  A similar process was carried out on the right side allowing for bilateral tubal sterilization.  Good hemostasis was noted overall.  Local analgesia was injected into both Filshie application sites.The instruments were then removed from the patient's abdomen and the fascial incision was repaired with 0 Vicryl, and the skin was closed with a 4-0 Vicryl subcuticular stitch. 30cc of 0.5% Marcaine was injected into the incision. The patient tolerated the procedure well.  Instrument, sponge, and needle counts were correct times two.  The patient was then taken to the recovery room awake and in stable condition.  Makayleigh Poliquin L. Harraway-Smith, M.D., [place]n Core

## 2017-01-11 NOTE — Anesthesia Preprocedure Evaluation (Addendum)
Anesthesia Evaluation  Patient identified by MRN, date of birth, ID band Patient awake    Reviewed: Allergy & Precautions, H&P , NPO status , Patient's Chart, lab work & pertinent test results, reviewed documented beta blocker date and time   Airway Mallampati: II  TM Distance: >3 FB Neck ROM: full    Dental no notable dental hx. (+) Teeth Intact, Dental Advidsory Given   Pulmonary asthma ,    Pulmonary exam normal breath sounds clear to auscultation       Cardiovascular Exercise Tolerance: Good negative cardio ROS Normal cardiovascular exam Rhythm:Regular Rate:Normal     Neuro/Psych PSYCHIATRIC DISORDERS Anxiety negative neurological ROS     GI/Hepatic Neg liver ROS, GERD  Controlled and Medicated,  Endo/Other  negative endocrine ROS  Renal/GU negative Renal ROS  negative genitourinary   Musculoskeletal negative musculoskeletal ROS (+)   Abdominal   Peds  Hematology  (+) anemia ,   Anesthesia Other Findings   Reproductive/Obstetrics Desires sterilization post partum                             Anesthesia Physical  Anesthesia Plan  ASA: II  Anesthesia Plan: General   Post-op Pain Management:    Induction: Intravenous and Cricoid pressure planned  Airway Management Planned: Oral ETT  Additional Equipment:   Intra-op Plan:   Post-operative Plan: Extubation in OR  Informed Consent: I have reviewed the patients History and Physical, chart, labs and discussed the procedure including the risks, benefits and alternatives for the proposed anesthesia with the patient or authorized representative who has indicated his/her understanding and acceptance.   Dental advisory given  Plan Discussed with: Anesthesiologist, CRNA and Surgeon  Anesthesia Plan Comments: (Epidural was pulled by RN after delivery. Will proceed with GA.)       Anesthesia Quick Evaluation

## 2017-01-11 NOTE — Progress Notes (Signed)
Post Partum Day #0 Subjective: no complaints, up ad lib, voiding and has been NPO since 0200, desires BTL  Objective: Blood pressure 101/65, pulse 65, temperature 98.2 F (36.8 C), temperature source Oral, resp. rate 18, height  (1.575 m), weight 146 lb (66.2 kg), last menstrual period 02/09/2016, SpO2 100 %, unknown if currently breastfeeding.  Physical Exam:  General: alert, cooperative and no distress Lochia: appropriate Uterine Fundus: firm Incision: none DVT Evaluation: No evidence of DVT seen on physical exam. No cords or calf tenderness. No significant calf/ankle edema.   Recent Labs  01/10/17 2215 01/11/17 0558  HGB 10.1* 9.9*  HCT 32.1* 30.9*    Assessment/Plan: Contraception desires BTL while in hospital.    LOS: 1 day   Roe Coombs, CNM 01/11/2017, 8:08 AM

## 2017-01-12 ENCOUNTER — Encounter (HOSPITAL_COMMUNITY): Payer: Self-pay | Admitting: Obstetrics & Gynecology

## 2017-01-12 LAB — RPR: RPR: NONREACTIVE

## 2017-01-12 MED ORDER — IBUPROFEN 600 MG PO TABS: 600.0000 mg | ORAL_TABLET | Freq: Four times a day (QID) | ORAL | 0 refills | Status: DC

## 2017-01-12 MED ORDER — OXYCODONE HCL 5 MG PO TABS: 5.0000 mg | ORAL_TABLET | ORAL | 0 refills | Status: DC | PRN

## 2017-01-12 NOTE — Discharge Summary (Signed)
OB Discharge Summary  Patient Name: Marie Holt DOB: [birth year] MRN: [id number]  Date of admission: 01/10/2017 Delivering MD: Marylene Land   Date of discharge: 01/12/2017  Admitting diagnosis: 39.4w pain, in labor, ctx 4-5 min undesired fertility Intrauterine pregnancy: [redacted]w[redacted]d     Secondary diagnosis:Active Problems:   Normal labor   Encounter for sterilization  Additional problems:anemia     Discharge diagnosis: Term Pregnancy Delivered                                                                     Post partum procedures:postpartum tubal ligation   Complications: None  Hospital course:  Onset of Labor With Vaginal Delivery     26 y.o. yo G3P3003 at [redacted]w[redacted]d was admitted in Active Labor on 01/10/2017. Patient had an uncomplicated labor course as follows:  Membrane Rupture Time/Date: 1:03 AM ,01/11/2017   Intrapartum Procedures: Episiotomy: None [1]                                         Lacerations:  None [1]  Patient had a delivery of a Viable infant. 01/11/2017  Information for the patient's newborn:  Nieves, Chapa [[id number]]  Delivery Method: Vaginal, Spontaneous Delivery (Filed from Delivery Summary)    Pateint had an uncomplicated postpartum course.  She is ambulating, tolerating a regular diet, passing flatus, and urinating well. Patient is discharged home in stable condition on 01/12/17.   Physical exam  Vitals:   01/11/17 1700 01/11/17 2105 01/12/17 0135 01/12/17 0500  BP: (!) 102/55 (!) 96/55 (!) 100/55 96/67  Pulse: 69 85 65 65  Resp: Temp: 98 F (36.7 C) 97.8 F (36.6 C) 98.2 F (36.8 C) 98.1 F (36.7 C)  TempSrc: Oral Oral Oral Oral  SpO2:  99% 100%   Weight:      Height:       General: alert Lochia: appropriate Uterine Fundus: firm Incision: Dressing is clean, dry, and intact DVT Evaluation: No evidence of DVT seen on physical exam. Labs: Lab Results  Component Value Date   WBC 13.6 (H) 01/11/2017   HGB 9.9  (L) 01/11/2017   HCT 30.9 (L) 01/11/2017   MCV 86.3 01/11/2017   PLT 217 01/11/2017   CMP Latest Ref Rng & Units 06/23/2016  Glucose 65 - 99 mg/dL 80  BUN 6 - 20 mg/dL 12  Creatinine 5.62 - 1.30 mg/dL 8.65  Sodium 784 - 696 mmol/L 138  Potassium 3.5 - 5.1 mmol/L 3.7  Chloride 101 - 111 mmol/L 103  CO2 22 - 32 mmol/L 26  Calcium 8.9 - 10.3 mg/dL 29.5  Total Protein 6.5 - 8.1 g/dL 7.5  Total Bilirubin 0.3 - 1.2 mg/dL 1.0  Alkaline Phos 38 - 126 U/L 49  AST 15 - 41 U/L 16  ALT 14 - 54 U/L 12(L)    Discharge instruction: per After Visit Summary and "Baby and Me Booklet".  After Visit Meds:  Allergies as of 01/12/2017      Reactions   Vicodin [hydrocodone-acetaminophen] Nausea And Vomiting      Medication List    TAKE  these medications   albuterol 108 (90 Base) MCG/ACT inhaler Commonly known as:  PROVENTIL HFA;VENTOLIN HFA Inhale 2 puffs into the lungs every 6 (six) hours as needed for wheezing or shortness of breath.   ibuprofen 600 MG tablet Commonly known as:  ADVIL,MOTRIN Take 1 tablet (600 mg total) by mouth every 6 (six) hours.   OB COMPLETE PETITE 35-5-1-200 MG Caps Take 1 tablet by mouth daily.   omeprazole 20 MG capsule Commonly known as:  PRILOSEC TAKE 1 CAPSULE(20 MG) BY MOUTH TWICE DAILY BEFORE A MEAL   oxyCODONE 5 MG immediate release tablet Commonly known as:  Oxy IR/ROXICODONE Take 1 tablet (5 mg total) by mouth every 4 (four) hours as needed for severe pain or breakthrough pain.       Diet: routine diet  Activity: Advance as tolerated. Pelvic rest for 6 weeks.   Outpatient follow up:6 weeks Follow up Appt:Future Appointments Date Time Provider Department Center  01/18/2017 3:15 PM Roe Coombs, CNM [facility] None   Follow up visit: No Follow-up on file.  Postpartum contraception: Tubal Ligation  Newborn Data: Live born female  Birth Weight: 8 lb 2.5 oz (3700 g) APGAR: 7, 9  Baby Feeding: Bottle Disposition:home with  mother   01/12/2017 Allie Bossier, MD

## 2017-01-12 NOTE — Progress Notes (Signed)
CLINICAL SOCIAL WORK MATERNAL/CHILD NOTE  Patient Details  Name: Marie Holt MRN: [id number] Date of Birth: 01/11/2017  Date:  01/12/2017  Clinical Social Worker Initiating Note:  Terri Piedra, [place] Date/ Time Initiated:  01/12/17/1601     Child's Name:  Marie Holt   Legal Guardian:  Other (Comment) (Marie Holt and Marie Holt)   Need for Interpreter:  None   Date of Referral:  01/12/17     Reason for Referral:  Current Substance Use/Substance Use During Pregnancy    Referral Source:  [facility]   Address:  [address], [place], [place] [address]  Phone number:  [phone]   Household Members:  Minor Children, Significant Other (Parents have two daughters at home, ages 68 and 66.)   Natural Supports (not living in the home):  Extended Family, Friends, Immediate Family (Marie Holt reports having a good support system of family and friends.)   Professional Supports: None   Employment:     Type of Work: FOB works for a [employer] cabin crew with the state to weed eat the highways."   Education:      Museum/gallery curator Resources:  [employer]   Other Resources:  [employer], Food Stamps    Cultural/Religious Considerations Which May Impact Care: None stated.  Marie Holt's facesheet notes religion as Non-Denominational.  Strengths:  Ability to meet basic needs , Home prepared for child , Pediatrician chosen  (Dr. Anastasio Champion)   Risk Factors/Current Problems:  Substance Use  (hx marijuana use)   Cognitive State:  Able to Concentrate , Alert , Linear Thinking    Mood/Affect:  Comfortable , Interested , Calm    CSW Assessment: CSW met with Marie Holt in her first floor room/122 to offer support and complete assessment for marijuana use.  Marie Holt was pleasant and welcoming of CSW's visit.  She was soft spoken and CSW found her easy to engage.  Baby was asleep in the bassinet throughout entire assessment. Marie Holt reports she and baby are doing well.  She states she and FOB live together and have two  daughters together.  They have been in a relationship for 6 years and Marie Holt describes it as positive.  She states they have everything they need for baby at home and is aware of SIDS precautions as reviewed by CSW.   CSW asked how she is feeling emotionally and how she felt after her first two deliveries.  Marie Holt acknowledges a hx of anxiety "in [place]" and states no symptoms since.  She reports feeling well emotionally at this time and was attentive to education regarding PMADs provided by CSW.  CSW gave information on support groups held at [facility] as well as a new mom checklist as a way to self-evaluate.  Marie Holt was receptive.  She was able to identify positive coping mechanisms such as "me time" and taking showers. CSW inquired about a positive UDS for marijuana in October 2017.  Marie Holt admits to smoking occasionally prior to finding out she was pregnant and reports that she "stopped immediately" once she had a positive pregnancy test.  She denies use of any other substance and states she has not had hx with CPS other than an ex-girlfriend of FOB calling to report her, which was unsubstantiated.  CSW explained hospital drug screening and mandated reporting to CPS.  Marie Holt stated no concerns.  40 UDS is negative and CSW will monitor pending CDS result and make report accordingly.  CSW Plan/Description:  Information/Referral to [employer] , No Further Intervention Required/No Barriers to  Discharge, Patient/Family Education     Alphonzo Cruise, [place] 01/12/2017, 4:05 PM

## 2017-01-12 NOTE — Discharge Instructions (Signed)

## 2017-01-16 ENCOUNTER — Encounter (HOSPITAL_COMMUNITY): Payer: Self-pay | Admitting: Obstetrics & Gynecology

## 2017-01-16 ENCOUNTER — Encounter (HOSPITAL_COMMUNITY): Payer: Self-pay

## 2017-01-18 ENCOUNTER — Encounter: Payer: Medicaid Other | Admitting: Certified Nurse Midwife

## 2017-03-13 ENCOUNTER — Encounter: Payer: Self-pay | Admitting: Certified Nurse Midwife

## 2017-03-13 ENCOUNTER — Ambulatory Visit (INDEPENDENT_AMBULATORY_CARE_PROVIDER_SITE_OTHER): Payer: Medicaid Other | Admitting: Certified Nurse Midwife

## 2017-03-13 VITALS — BP 111/74 | HR 112 | Ht 62.0 in | Wt 135.4 lb

## 2017-03-13 DIAGNOSIS — N946 Dysmenorrhea, unspecified: Secondary | ICD-10-CM

## 2017-03-13 DIAGNOSIS — Z9851 Tubal ligation status: Secondary | ICD-10-CM

## 2017-03-13 MED ORDER — IBUPROFEN 800 MG PO TABS: 800.0000 mg | ORAL_TABLET | Freq: Three times a day (TID) | ORAL | 1 refills | Status: DC | PRN

## 2017-03-13 NOTE — Progress Notes (Signed)
Post Partum Exam  Marie Holt is a 26 y.o. 363P3003 female who presents for a postpartum visit. She is 8 weeks postpartum following a spontaneous vaginal delivery. I have fully reviewed the prenatal and intrapartum course. The delivery was at 39 gestational weeks.  Anesthesia: epidural. Postpartum course has been unremarkable. Baby's course has been unremarkable. Baby is feeding by bottle - Similac Advance. Bleeding moderate lochia, reports irregular bleeding, just stopped bleeding for 2 days and then started again, reports heavy flow.  Reports dysmenorrhea with cramping. Encouraged to take PNV. Bowel function is normal. Bladder function is normal. Patient is sexually active. Contraception method is tubal ligation. Postpartum depression screening:neg  The following portions of the patient's history were reviewed and updated as appropriate: allergies, current medications, past family history, past medical history, past social history, past surgical history and problem list.  Review of Systems Pertinent items noted in HPI and remainder of comprehensive ROS otherwise negative.    Objective:  unknown if currently breastfeeding.  General:  alert, cooperative and no distress   Breasts:  inspection negative, no nipple discharge or bleeding, no masses or nodularity palpable  Lungs: clear to auscultation bilaterally  Heart:  regular rate and rhythm, S1, S2 normal, no murmur, click, rub or gallop  Abdomen: soft, non-tender; bowel sounds normal; no masses,  no organomegaly  Pelvic Exam: Not performed.        Assessment:    Normal 8 week postpartum exam. Pap smear not done at today's visit.   Dysmenorrhea and Irregular periods following BTL  Plan:   1. Contraception: tubal ligation 2.  Motrin for dysmenorrhea/bleeding.  Considering Lysteda, Mirena/Lyletta IUD. 3. Follow up in: 4 months for annual exam or as needed.

## 2017-03-21 ENCOUNTER — Telehealth: Payer: Self-pay

## 2017-03-21 ENCOUNTER — Other Ambulatory Visit: Payer: Self-pay | Admitting: Certified Nurse Midwife

## 2017-03-21 DIAGNOSIS — B9689 Other specified bacterial agents as the cause of diseases classified elsewhere: Secondary | ICD-10-CM

## 2017-03-21 DIAGNOSIS — N760 Acute vaginitis: Principal | ICD-10-CM

## 2017-03-21 MED ORDER — METRONIDAZOLE 500 MG PO TABS: 500.0000 mg | ORAL_TABLET | Freq: Two times a day (BID) | ORAL | 0 refills | Status: AC

## 2017-03-21 NOTE — Telephone Encounter (Signed)
Pt called and complains of having a milky white discharge with slight odor, with irritation. Sx started about 3 days ago. Per protocol rx for Flagyl sent to pharmacy. Sent to [facility]

## 2017-03-26 ENCOUNTER — Telehealth: Payer: Self-pay | Admitting: *Deleted

## 2017-03-26 NOTE — Telephone Encounter (Signed)
Pt called to office stating she was recently treated for BV and now thinks she has a UTI. Would like treatment.  Attempt to contact pt. LM with female to have pt contact office.

## 2017-04-02 ENCOUNTER — Other Ambulatory Visit: Payer: Self-pay | Admitting: Certified Nurse Midwife

## 2017-04-02 NOTE — Telephone Encounter (Signed)
Please bring her in for a urine dip and culture as necessary.  Thank you.  R.Roseanne Juenger CNM

## 2017-04-03 NOTE — Telephone Encounter (Addendum)
Pt unable to report today for urine dip. She is available tomorrow for urine dip possible culture to r/o UTI.

## 2017-04-03 NOTE — Telephone Encounter (Signed)
Unable to reach pt. Automated message "calls can not be accepted at this time."

## 2017-04-04 ENCOUNTER — Other Ambulatory Visit (HOSPITAL_COMMUNITY)
Admission: RE | Admit: 2017-04-04 | Discharge: 2017-04-04 | Disposition: A | Discharge status: Home / Self Care | Payer: Medicaid Other | Source: Ambulatory Visit | Attending: Obstetrics and Gynecology | Admitting: Obstetrics and Gynecology

## 2017-04-04 ENCOUNTER — Other Ambulatory Visit (INDEPENDENT_AMBULATORY_CARE_PROVIDER_SITE_OTHER): Payer: Self-pay | Admitting: Obstetrics and Gynecology

## 2017-04-04 ENCOUNTER — Other Ambulatory Visit: Payer: Medicaid Other

## 2017-04-04 ENCOUNTER — Encounter: Payer: Self-pay | Admitting: Obstetrics and Gynecology

## 2017-04-04 DIAGNOSIS — R300 Dysuria: Secondary | ICD-10-CM | POA: Insufficient documentation

## 2017-04-04 DIAGNOSIS — N898 Other specified noninflammatory disorders of vagina: Secondary | ICD-10-CM | POA: Insufficient documentation

## 2017-04-04 NOTE — Patient Instructions (Signed)
Cervicitis  Cervicitis is irritation and swelling of the cervix. The cervix is the lower and narrow end of the uterus. It is the part of the uterus that opens up to the vagina.  What are the causes?  This condition may be caused by:  · An STI (sexually transmitted infection), such as gonorrhea, chlamydia, or genital herpes.  · Objects that are put in the vagina, such as tampons or birth control devices. This usually occurs if an object is left in for too long.  · Chemical irritation or allergic reaction. This may be from vaginal douches, latex condoms, or contraceptive creams.  · An injury to the cervix.  · A bacterial infection.  · Radiation therapy.    What increases the risk?  You are more likely to develop this condition if:  · You have unprotected sex.  · You have sex with many partners.  · You have a new sexual partner.  · You start having sex at an early age.  · You have a history of STIs.    What are the signs or symptoms?  Symptoms of this condition include:  · Gray, white, yellow, or bad-smelling vaginal discharge.  · Pain or itchiness around the vagina.  · Pain during sex.  · Pain in the lower abdomen or lower back, especially during sex.  · Urinating often.  · Pain during urination.  · Abnormal vaginal bleeding, such as bleeding between periods, after sex, or after menopause.    In some cases, there are no symptoms.  How is this diagnosed?  This condition may be diagnosed with:  · A pelvic exam. Your health care provider will examine whether the cervix has an unusual discharge or bleeds easily when touched with a swab.  · A wet prep. This is a test in which vaginal discharge is examined under a microscope to check for signs of infection.  · A swab test of the cervix. For this test, sample cells from the cervix are collected on a swab and examined under a microscope to check for signs of infection.  · Urine tests.    How is this treated?  Treatment for cervicitis depends on what is causing the condition.  Treatment may include:  · Antibiotic medicines. These are used to treat certain infections, including STIs like gonorrhea or chlamydia. If you are taking these medicines to treat an STI, your sexual partner may also need to take these medicines.  · Antiviral medicines. These are used to treat herpes simplex virus. Your sexual partner may also need to take these medicines.  · Stopping use of items that cause irritation, such as tampons, latex condoms, douches, or spermicides.    Follow these instructions at home:  · Do not have sex until your health care provider says it is okay.  · Take over-the-counter and prescription medicines only as told by your health care provider.  · If you were prescribed an antibiotic, take it as told by your health care provider. Do not stop taking the antibiotic even if you start to feel better.  · Keep all follow-up visits as told by your health care provider. This is important.  Contact a health care provider if:  · Your symptoms come back or get worse after treatment.  · You have a fever.  · You have fatigue.  · You have pain in your abdomen.  · You experience nausea, vomiting, or diarrhea.  · You have back pain.  Get help right away if:  ·   You have severe abdominal pain that cannot be helped with medicine.  · You cannot urinate.  Summary  · Cervicitis is irritation and swelling of the cervix.  · This condition may be caused by an STI (sexually transmitted infection), an allergic reaction or chemical irritation, radiation therapy, or objects that are put in the vagina, such as tampons or diaphragms.  · Symptoms of this condition can include unusual vaginal discharge, painful urination, irritation or pain around the vagina, bleeding between periods or after sex, and pain during sex.  · You are more likely to develop this condition if you have unprotected sex, have many sexual partners, or have a history of STIs.  · This condition may be treated with antibiotic or antiviral medicines or  by stopping use of items that cause irritation.  This information is not intended to replace advice given to you by your health care provider. Make sure you discuss any questions you have with your health care provider.  Document Released: 09/25/2005 Document Revised: 06/10/2016 Document Reviewed: 06/10/2016  Elsevier Interactive Patient Education © 2017 [employer]

## 2017-04-04 NOTE — Progress Notes (Signed)
Patient presents for possible UTI, frequent voiding with burning and yellow discharge. Denies pain.  Pt presents with above complaints. Was treated for probable BV on June 13 with Flagyl. However discharge has continued. Urine Sx started about a week ago.'She denies any F/C/or back pain. Bottle feeding. Last IC was over one week ago. BTL for contraception  PE Af VSS Lungs clear Heart RRR Back no CVA tenderness GU nl EGBU, scant white discharge bladder non tender uterus small mobile non tender no adnexal masses or tenderness Wet prep obtained  A\P Dysuria        Vaginal discharge  UA not suggestive of UTI, while send for UC. Wet prep. Treatment based on test results.

## 2017-04-05 LAB — CERVICOVAGINAL ANCILLARY ONLY
Bacterial vaginitis: POSITIVE — AB
CHLAMYDIA, DNA PROBE: NEGATIVE
Candida vaginitis: NEGATIVE
Neisseria Gonorrhea: NEGATIVE
Trichomonas: NEGATIVE

## 2017-04-06 LAB — URINE CULTURE

## 2017-04-10 ENCOUNTER — Other Ambulatory Visit: Payer: Self-pay | Admitting: *Deleted

## 2017-04-10 DIAGNOSIS — N760 Acute vaginitis: Principal | ICD-10-CM

## 2017-04-10 DIAGNOSIS — B9689 Other specified bacterial agents as the cause of diseases classified elsewhere: Secondary | ICD-10-CM

## 2017-04-10 MED ORDER — METRONIDAZOLE 500 MG PO TABS: 500.0000 mg | ORAL_TABLET | Freq: Two times a day (BID) | ORAL | 0 refills | Status: DC

## 2017-04-10 NOTE — Progress Notes (Signed)
[facility] See lab note

## 2017-06-03 IMAGING — US US OB TRANSVAGINAL
1 series · 15 of 28 positions shown · non-contrast
Comparison: None.

CLINICAL DATA: Excessive vomiting since yesterday morning, pain,
early pregnancy.

EXAM:
TRANSVAGINAL OB ULTRASOUND
TECHNIQUE: Transvaginal ultrasound was performed for complete evaluation of the
gestation as well as the maternal uterus, adnexal regions, and
pelvic cul-de-sac.

[Series 1: us ob transvaginal · 15 of 48 slices shown]
[im 1/48]
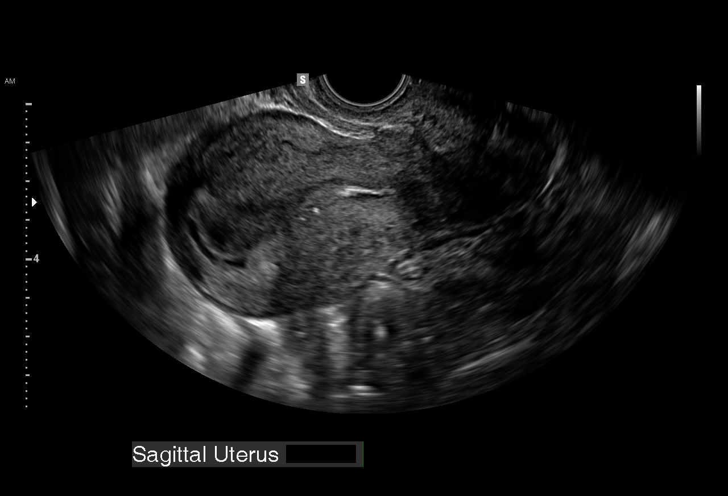
[im 4/48]
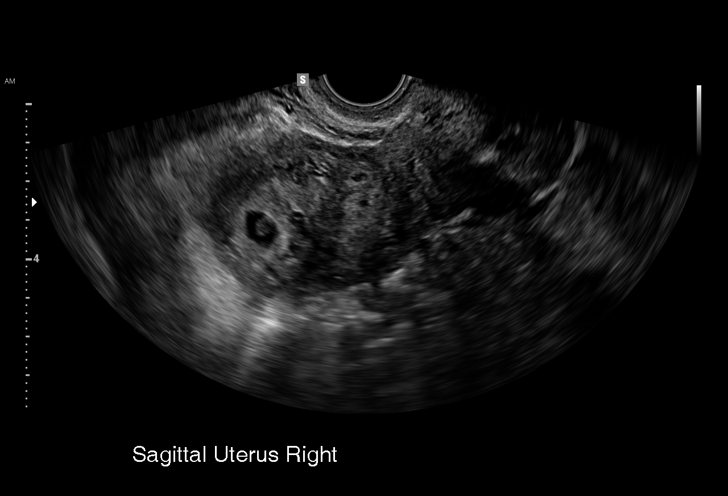
[im 7/48]
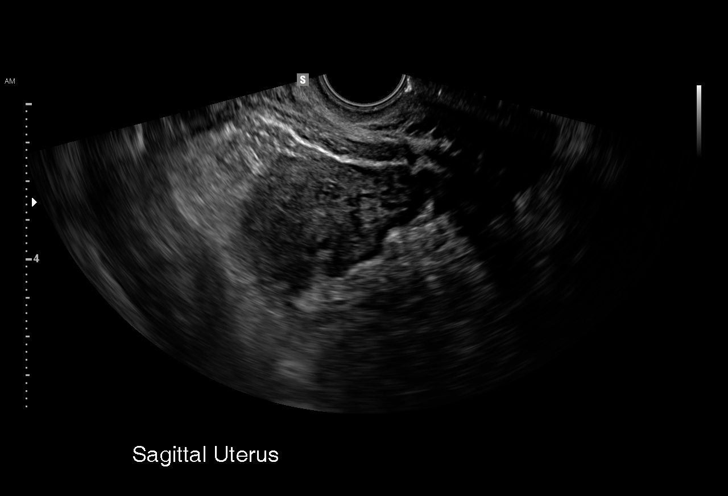
[im 11/48]
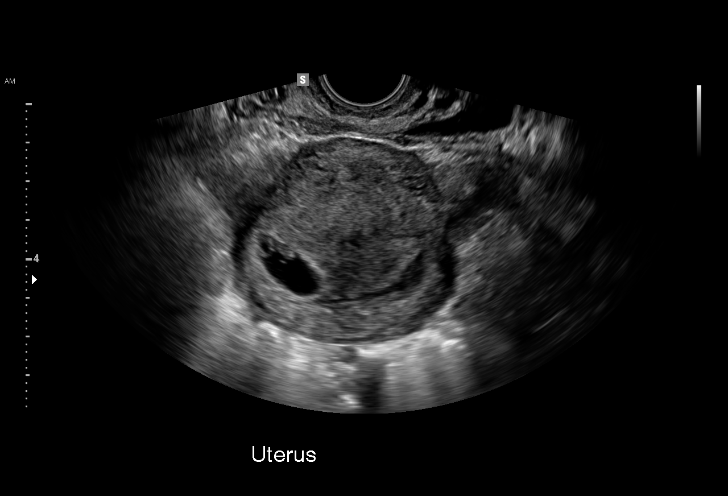
[im 14/48]
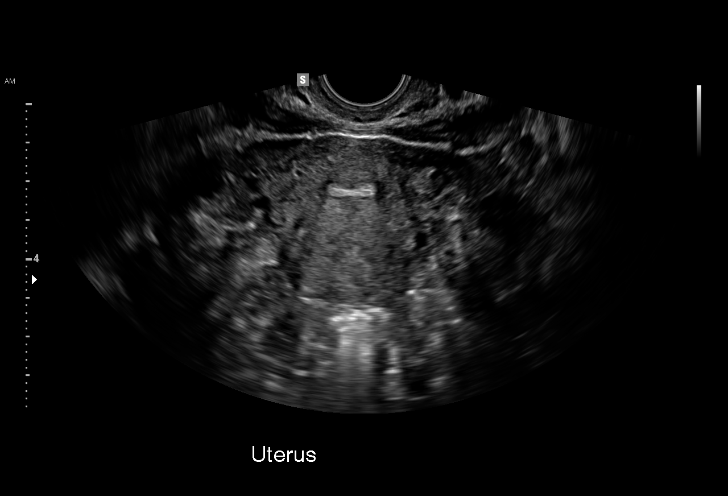
[im 18/48]
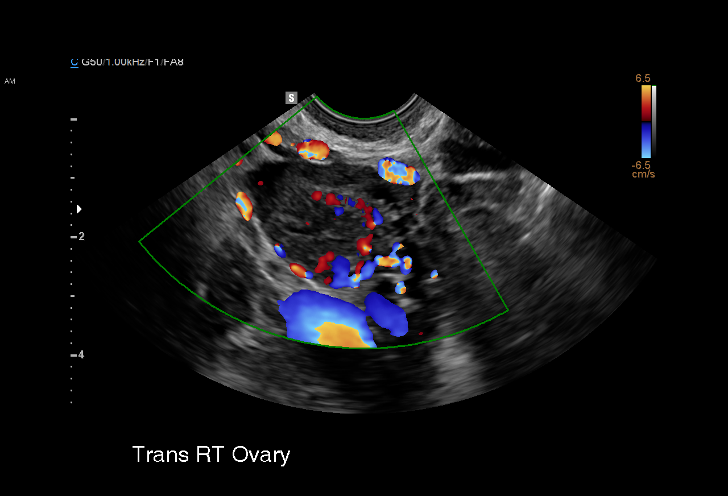
[im 21/48]
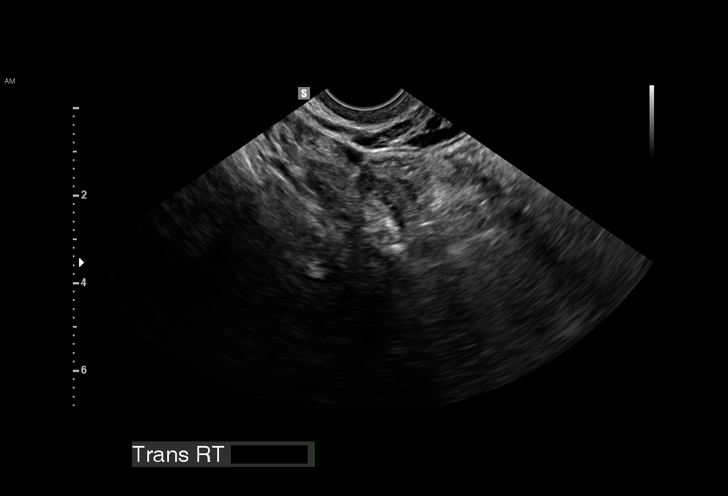
[im 25/48]
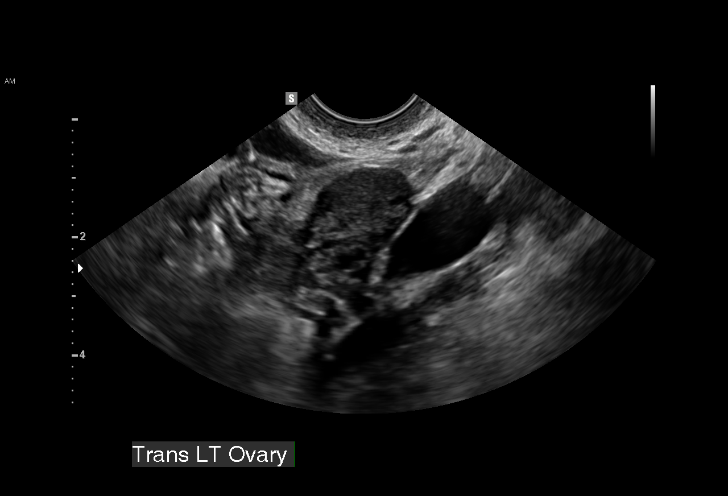
[im 27/48]
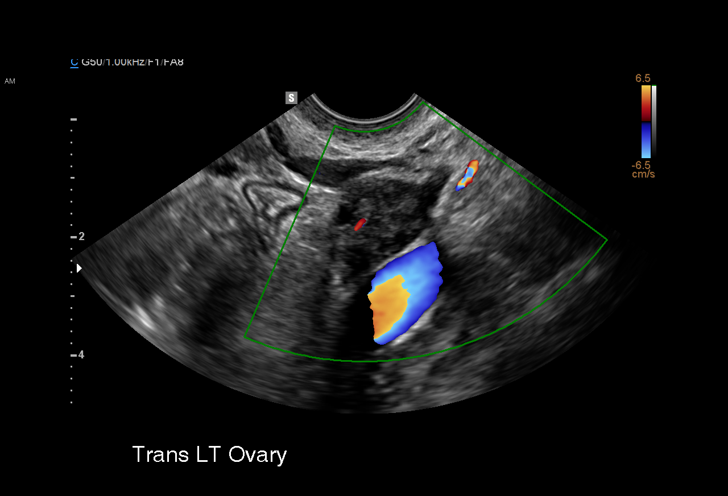
[im 30/48]
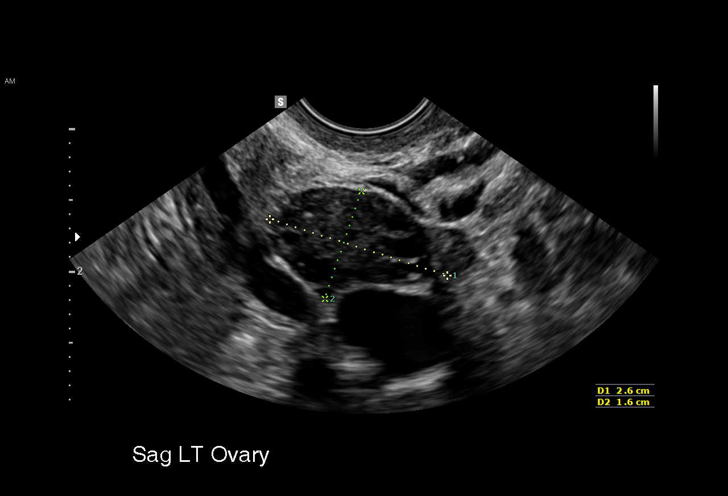
[im 34/48]
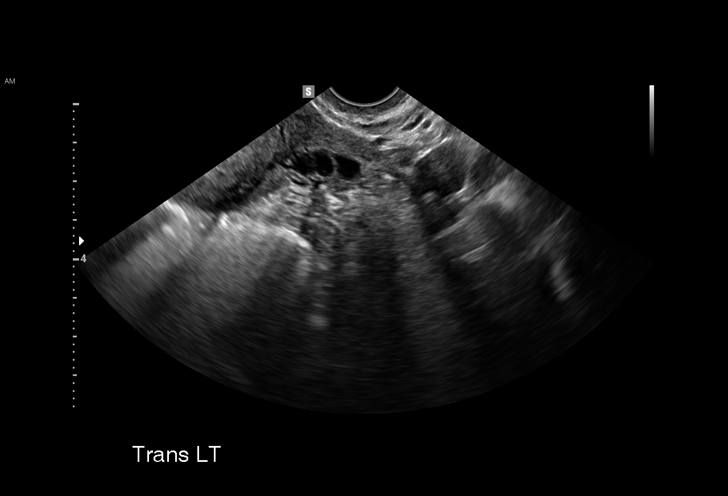
[im 37/48]
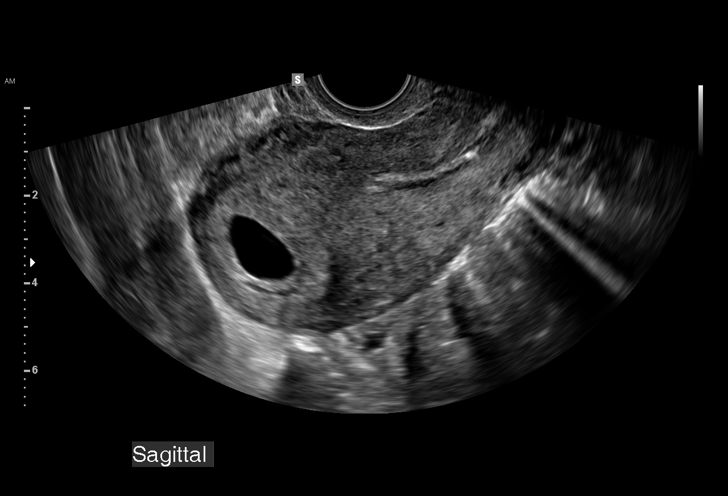
[im 41/48]
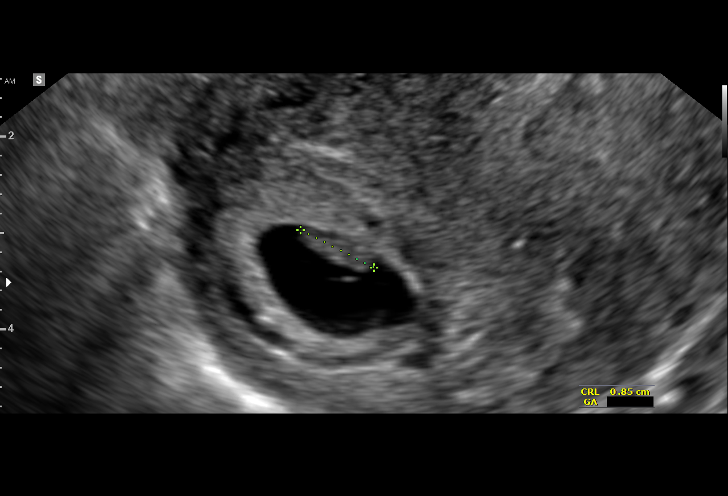
[im 44/48]
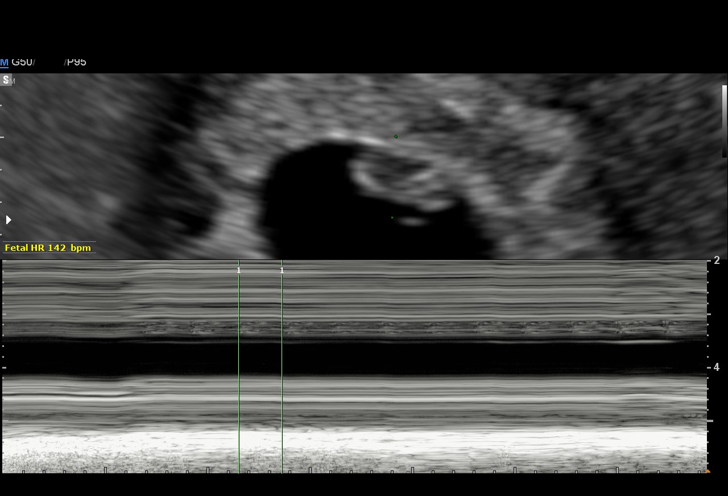
[im 48/48]
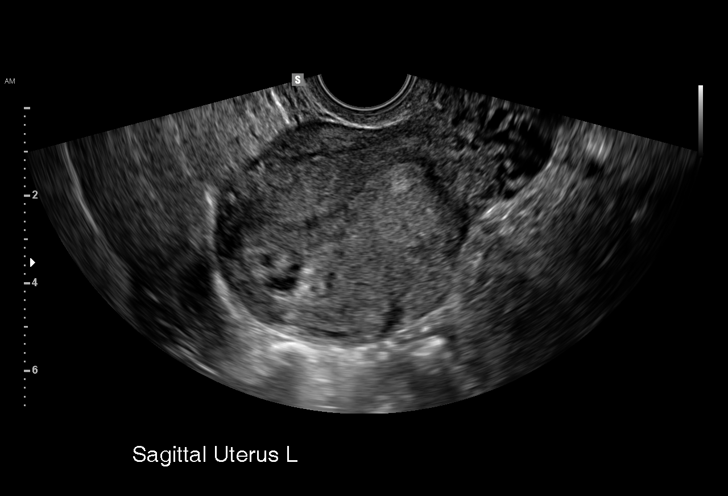

[15 of 28 positions shown; findings below may reference images not displayed]

FINDINGS: Intrauterine gestational sac: Single intrauterine gestational sac
with appropriate surrounding decidual reaction.

Yolk sac:  Normal

Embryo:  Present

Cardiac Activity: Present

Heart Rate: 142 bpm

MSD:   mm    w     d

CRL:   9  mm   6 w 5 d                  US EDC: 01/13/2017

Subchorionic hemorrhage: Questionable small subchorionic bleed, but
not convincing.

Maternal uterus/adnexae: Maternal ovaries are unremarkable with
expected corpus luteum in the right ovary. No mass or free fluid
identified within either adnexal region. No mass or free fluid seen
in the cul-de-sac.
IMPRESSION: 1. Single live intrauterine pregnancy with estimated gestational age
of 6 weeks and 5 days. Fetal heart rate of 142 beats per minute.
2. No abnormality identified. Questionable small subchorionic bleed,
but not convincing.

## 2018-01-25 ENCOUNTER — Encounter (HOSPITAL_COMMUNITY): Payer: Self-pay | Admitting: Emergency Medicine

## 2018-01-25 ENCOUNTER — Other Ambulatory Visit: Payer: Self-pay

## 2018-01-25 ENCOUNTER — Ambulatory Visit (HOSPITAL_COMMUNITY)
Admission: EM | Admit: 2018-01-25 | Discharge: 2018-01-25 | Disposition: A | Discharge status: Home / Self Care | Payer: Medicaid Other | Attending: Internal Medicine | Admitting: Internal Medicine

## 2018-01-25 DIAGNOSIS — Z87442 Personal history of urinary calculi: Secondary | ICD-10-CM | POA: Insufficient documentation

## 2018-01-25 DIAGNOSIS — Z9851 Tubal ligation status: Secondary | ICD-10-CM | POA: Diagnosis not present

## 2018-01-25 DIAGNOSIS — Z3202 Encounter for pregnancy test, result negative: Secondary | ICD-10-CM | POA: Diagnosis not present

## 2018-01-25 DIAGNOSIS — N750 Cyst of Bartholin's gland: Secondary | ICD-10-CM

## 2018-01-25 DIAGNOSIS — N898 Other specified noninflammatory disorders of vagina: Secondary | ICD-10-CM

## 2018-01-25 DIAGNOSIS — Z8249 Family history of ischemic heart disease and other diseases of the circulatory system: Secondary | ICD-10-CM | POA: Insufficient documentation

## 2018-01-25 DIAGNOSIS — Z801 Family history of malignant neoplasm of trachea, bronchus and lung: Secondary | ICD-10-CM | POA: Diagnosis not present

## 2018-01-25 DIAGNOSIS — Z885 Allergy status to narcotic agent status: Secondary | ICD-10-CM | POA: Insufficient documentation

## 2018-01-25 HISTORY — DX: Tubal ligation status: Z98.51

## 2018-01-25 LAB — POCT URINALYSIS DIP (DEVICE)
Bilirubin Urine: NEGATIVE
Glucose, UA: NEGATIVE mg/dL
KETONES UR: NEGATIVE mg/dL
Nitrite: NEGATIVE
PH: 7.5 (ref 5.0–8.0)
PROTEIN: NEGATIVE mg/dL
SPECIFIC GRAVITY, URINE: 1.015 (ref 1.005–1.030)
Urobilinogen, UA: 0.2 mg/dL (ref 0.0–1.0)

## 2018-01-25 LAB — POCT PREGNANCY, URINE: Preg Test, Ur: NEGATIVE

## 2018-01-25 MED ORDER — FLUCONAZOLE 200 MG PO TABS: 200.0000 mg | ORAL_TABLET | Freq: Once | ORAL | 0 refills | Status: AC

## 2018-01-25 NOTE — ED Provider Notes (Addendum)
[facility]    CSN: [id number] Arrival date & time: 01/25/18  1817     History   Chief Complaint Chief Complaint  Patient presents with  . Bartholin's Cyst    HPI Marie Holt is a 27 y.o. female presenting today for evaluation of Bartholin's cyst swelling and vaginal irritation.  States that she has had off-and-on swelling of her Bartholin's gland for a while, but worsened recently.  Swelling was worse yesterday, decreased slightly today.  Also has noted a lot of irritation and discomfort to the outer vaginal/vulval area.  Denies any dysuria or increased frequency.  Denies any fever, nausea, vomiting, abdominal pain, pelvic pain.  Denies any abnormal vaginal discharge.  Denies any rashes or lesions.  Has tried warm soaks in the bathtub.  Patient is currently ending her menstrual cycle.  HPI  Past Medical History:  Diagnosis Date  . Anxiety   . Asthma   . History of bilateral tubal ligation   . 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 Right ureteral stone   . Yeast infection     Patient Active Problem List   Diagnosis Date Noted  . Dysuria 04/04/2017  . Vaginal discharge 04/04/2017  . History of bilateral tubal ligation 01/11/2017  . Asthma 01/29/2012  . Poor dentition 01/29/2012    Past Surgical History:  Procedure Laterality Date  . CYSTOSCOPY W/ RETROGRADES  06/03/2012   Procedure: CYSTOSCOPY WITH RETROGRADE PYELOGRAM;  Surgeon: Valetta Fuller, MD;  Location: [facility];  Service: Urology;  Laterality: Right;  . TUBAL LIGATION Bilateral 01/11/2017   Procedure: POST PARTUM TUBAL LIGATION;  Surgeon: Willodean Rosenthal, MD;  Location: [facility];  Service: Gynecology;  Laterality: Bilateral;    OB History    Gravida  3   Para  3   Term  3   Preterm      AB      Living  3     SAB      TAB      Ectopic      Multiple  0   Live Births  3            Home Medications    Prior to  Admission medications   Medication Sig Start Date End Date Taking? Authorizing Provider  fluconazole (DIFLUCAN) 200 MG tablet Take 1 tablet (200 mg total) by mouth once for 1 dose. 01/25/18 01/25/18  Stacye Noori, Junius Creamer, PA-C    Family History Family History  Problem Relation Age of Onset  . Hypertension Mother   . Cancer Father        lung  . Cancer Paternal Aunt        breast  . Von Willebrand disease Sister   . Hypertension Maternal Grandmother   . Cancer Paternal Grandmother        unknown origin  . Cancer Paternal Grandfather        unknown origin  . Anesthesia problems Neg Hx     Social History Social History   Tobacco Use  . Smoking status: Never Smoker  . Smokeless tobacco: Never Used  Substance Use Topics  . Alcohol use: No    Comment: occasional  . Drug use: No    Types: Marijuana    Comment: denies any usuage     Allergies   Vicodin [hydrocodone-acetaminophen]   Review of Systems Review of Systems  Constitutional: Negative for fever.  Respiratory: Negative for shortness of  breath.   Cardiovascular: Negative for chest pain.  Gastrointestinal: Negative for abdominal pain, diarrhea, nausea and vomiting.  Genitourinary: Negative for dysuria, flank pain, genital sores, hematuria, menstrual problem, vaginal bleeding, vaginal discharge and vaginal pain.       Irritation  Musculoskeletal: Negative for back pain.  Skin: Negative for rash.  Neurological: Negative for dizziness, light-headedness and headaches.     Physical Exam Triage Vital Signs ED Triage Vitals  Enc Vitals Group     BP 01/25/18 1933 110/72     Pulse Rate 01/25/18 1933 75     Resp --      Temp 01/25/18 1933 98.5 F (36.9 C)     Temp Source 01/25/18 1933 Oral     SpO2 01/25/18 1933 100 %     Weight --      Height --      Head Circumference --      Peak Flow --      Pain Score 01/25/18 1930 9     Pain Loc --      Pain Edu? --      Excl. in [place]? --    No data found.  Updated Vital  Signs BP 110/72 (BP Location: Left Arm)   Pulse 75   Temp 98.5 F (36.9 C) (Oral)   LMP 01/18/2018   SpO2 100%   Visual Acuity Right Eye Distance:   Left Eye Distance:   Bilateral Distance:    Right Eye Near:   Left Eye Near:    Bilateral Near:     Physical Exam  Constitutional: She appears well-developed and well-nourished. No distress.  HENT:  Head: Normocephalic and atraumatic.  Eyes: Conjunctivae are normal.  Neck: Neck supple.  Cardiovascular: Normal rate and regular rhythm.  No murmur heard. Pulmonary/Chest: Effort normal and breath sounds normal. No respiratory distress.  Abdominal: Soft. There is no tenderness.  Genitourinary:  Genitourinary Comments: Mild swelling to right side Bartholin's gland, mild irregularity with erythema to internal labia minora.  Significant amount of brown discharge as well as thick whitish green discharge in vaginal vault.  Musculoskeletal: She exhibits no edema.  Neurological: She is alert.  Skin: Skin is warm and dry.  Psychiatric: She has a normal mood and affect.  Nursing note and vitals reviewed.    UC Treatments / Results  Labs (all labs ordered are listed, but only abnormal results are displayed) Labs Reviewed  POCT URINALYSIS DIP (DEVICE) - Abnormal; Notable for the following components:      Result Value   Hgb urine dipstick TRACE (*)    Leukocytes, UA MODERATE (*)    All other components within normal limits  URINE CULTURE  HSV CULTURE AND TYPING  POCT PREGNANCY, URINE  CERVICOVAGINAL ANCILLARY ONLY    EKG None Radiology No results found.  Procedures Procedures (including critical care time)  Medications Ordered in UC Medications - No data to display   Initial Impression / Assessment and Plan / UC Course  I have reviewed the triage vital signs and the nursing notes.  Pertinent labs & imaging results that were available during my care of the patient were reviewed by me and considered in my medical decision  making (see chart for details).     Moderate leuks on UA, will send for culture.  Patient was irritation to vaginal area, with significant amount of white thick discharge will go ahead and treat for yeast.  Vaginal swab obtained.  HSV culture obtained.  Will call patient with positive  results would not alter treatment as needed.  Bartholin's gland does not appear to need drainage at this time, will recommend to do warm compresses.  Discussed strict return precautions. Patient verbalized understanding and is agreeable with plan.   Final Clinical Impressions(s) / UC Diagnoses   Final diagnoses:  Vaginal discharge  Bartholin's cyst    ED Discharge Orders        Ordered    fluconazole (DIFLUCAN) 200 MG tablet   Once     01/25/18 2002       Controlled Substance Prescriptions [place] Controlled Substance Registry consulted? Not Applicable   Lew DawesWieters, Aquiles Ruffini C, PA-C 01/25/18 2043    Lew DawesWieters, Shawni Volkov C, [place]PA-C 01/25/18 2044

## 2018-01-25 NOTE — Discharge Instructions (Addendum)
Apply warm compresses to cyst multiple times a day or do warm soaking in the bathtub.  We are sending a urine for culture to make sure you do not have a UTI.  I have also sent a vaginal swab to confirm yeast/BV or any STDs.  I have sent in Diflucan for you to go ahead and take.  Please take once, may repeat tablet in 3 days if symptoms still persisting.  You may set up my chart to find results or we will call you in approximately 1 week with any positive results or need for any other medication.

## 2018-01-25 NOTE — ED Triage Notes (Signed)
States may have bartholin cyst with vaginal irritation onset 2 days

## 2018-01-27 LAB — URINE CULTURE: Culture: >=100000 — AB

## 2018-01-28 LAB — CERVICOVAGINAL ANCILLARY ONLY
Bacterial vaginitis: NEGATIVE
Candida vaginitis: POSITIVE — AB
Chlamydia: NEGATIVE
Neisseria Gonorrhea: NEGATIVE
TRICH (WINDOWPATH): NEGATIVE

## 2018-01-29 LAB — HSV CULTURE AND TYPING

## 2018-01-30 ENCOUNTER — Telehealth (HOSPITAL_COMMUNITY): Payer: Self-pay

## 2018-01-30 NOTE — Telephone Encounter (Signed)
Pt reports Diflucan not helping symptoms. Encouraged patient to be seen by PCP or OBGYN

## 2018-01-30 NOTE — Telephone Encounter (Signed)
Returned patients call. No answer at this time. Voicemail has not been set up.

## 2019-04-07 ENCOUNTER — Ambulatory Visit (HOSPITAL_COMMUNITY)
Admission: EM | Admit: 2019-04-07 | Discharge: 2019-04-07 | Disposition: A | Discharge status: Home / Self Care | Payer: Self-pay | Attending: Family Medicine | Admitting: Family Medicine

## 2019-04-07 ENCOUNTER — Encounter (HOSPITAL_COMMUNITY): Payer: Self-pay | Admitting: Emergency Medicine

## 2019-04-07 ENCOUNTER — Other Ambulatory Visit: Payer: Self-pay

## 2019-04-07 DIAGNOSIS — R319 Hematuria, unspecified: Secondary | ICD-10-CM | POA: Insufficient documentation

## 2019-04-07 DIAGNOSIS — Z3202 Encounter for pregnancy test, result negative: Secondary | ICD-10-CM

## 2019-04-07 DIAGNOSIS — N390 Urinary tract infection, site not specified: Secondary | ICD-10-CM | POA: Insufficient documentation

## 2019-04-07 LAB — POCT URINALYSIS DIP (DEVICE)
Bilirubin Urine: NEGATIVE
Glucose, UA: NEGATIVE mg/dL
Ketones, ur: NEGATIVE mg/dL
Nitrite: NEGATIVE
Protein, ur: NEGATIVE mg/dL
Specific Gravity, Urine: >=1.03 (ref 1.005–1.030)
Urobilinogen, UA: 0.2 mg/dL (ref 0.0–1.0)
pH: 6.5 (ref 5.0–8.0)

## 2019-04-07 LAB — POCT PREGNANCY, URINE: Preg Test, Ur: NEGATIVE

## 2019-04-07 MED ORDER — CEPHALEXIN 500 MG PO CAPS: 500.0000 mg | ORAL_CAPSULE | Freq: Two times a day (BID) | ORAL | 0 refills | Status: DC

## 2019-04-07 NOTE — ED Triage Notes (Signed)
Pt here for burning with urination and frequency

## 2019-04-07 NOTE — Discharge Instructions (Signed)
Take antibiotic twice a day for 7 days.  Your other vaginal test results are pending and we will call you if you need additional treatment.

## 2019-04-07 NOTE — ED Provider Notes (Signed)
[facility]    CSN: [id number] Arrival date & time: 04/07/19  1250     History   Chief Complaint Chief Complaint  Patient presents with  . Dysuria    appt 1:10    HPI Marie Holt is a 28 y.o. female.   She presents today with a 2-3 day history of urinary frequency and hesitancy which has resolved today.  She denies dysuria, fever, chills, abdominal pain, nausea, vomiting, diarrhea.  She reports small amount of pink-tinged and white discharge vaginally and states her vulva feels "tender" to touch.  She is sexually active and monogamous relationship with one partner for 1 year.  She has a history of a tubal ligation. LMP 2 months ago, irregular.   The history is provided by the patient. No language interpreter was used.    Past Medical History:  Diagnosis Date  . Anxiety   . Asthma   . History of bilateral tubal ligation   . 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 Right ureteral stone   . Yeast infection     Patient Active Problem List   Diagnosis Date Noted  . Dysuria 04/04/2017  . Vaginal discharge 04/04/2017  . History of bilateral tubal ligation 01/11/2017  . Asthma 01/29/2012  . Poor dentition 01/29/2012    Past Surgical History:  Procedure Laterality Date  . CYSTOSCOPY W/ RETROGRADES  06/03/2012   Procedure: CYSTOSCOPY WITH RETROGRADE PYELOGRAM;  Surgeon: Valetta Fulleravid S Grapey, MD;  [facility];  Service: Urology;  Laterality: Right;  . TUBAL LIGATION Bilateral 01/11/2017   Procedure: POST PARTUM TUBAL LIGATION;  Surgeon: Willodean Rosenthalarolyn Harraway-Smith, MD;  Location: [facility];  Service: Gynecology;  Laterality: Bilateral;    OB History    Gravida  3   Para  3   Term  3   Preterm      AB      Living  3     SAB      TAB      Ectopic      Multiple  0   Live Births  3            Home Medications    Prior to Admission medications   Medication Sig Start Date End Date Taking?  Authorizing Provider  cephALEXin (KEFLEX) 500 MG capsule Take 1 capsule (500 mg total) by mouth 2 (two) times daily. 04/07/19   Mickie Bailate, Deryk Bozman H, NP    Family History Family History  Problem Relation Age of Onset  . Hypertension Mother   . Cancer Father        lung  . Cancer Paternal Aunt        breast  . Von Willebrand disease Sister   . Hypertension Maternal Grandmother   . Cancer Paternal Grandmother        unknown origin  . Cancer Paternal Grandfather        unknown origin  . Anesthesia problems Neg Hx     Social History Social History   Tobacco Use  . Smoking status: Never Smoker  . Smokeless tobacco: Never Used  Substance Use Topics  . Alcohol use: No    Comment: occasional  . Drug use: No    Types: Marijuana    Comment: denies any usuage     Allergies   Vicodin [hydrocodone-acetaminophen]   Review of Systems Review of Systems  Constitutional: Negative for chills and fever.  HENT: Negative for ear pain  and sore throat.   Eyes: Negative for pain and visual disturbance.  Respiratory: Negative for cough and shortness of breath.   Cardiovascular: Negative for chest pain and palpitations.  Gastrointestinal: Negative for abdominal pain and vomiting.  Genitourinary: Positive for frequency and vaginal discharge. Negative for dysuria and hematuria.  Musculoskeletal: Negative for arthralgias and back pain.  Skin: Negative for color change and rash.  Neurological: Negative for seizures and syncope.  All other systems reviewed and are negative.    Physical Exam Triage Vital Signs ED Triage Vitals  Enc Vitals Group     BP 04/07/19 1317 107/71     Pulse Rate 04/07/19 1317 62     Resp 04/07/19 1317 18     Temp 04/07/19 1317 98.5 F (36.9 C)     Temp Source 04/07/19 1317 Oral     SpO2 04/07/19 1317 95 %     Weight --      Height --      Head Circumference --      Peak Flow --      Pain Score 04/07/19 1318 3     Pain Loc --      Pain Edu? --      Excl. in  [place]? --    No data found.  Updated Vital Signs BP 107/71 (BP Location: Right Arm)   Pulse 62   Temp 98.5 F (36.9 C) (Oral)   Resp 18   SpO2 95%   Visual Acuity Right Eye Distance:   Left Eye Distance:   Bilateral Distance:    Right Eye Near:   Left Eye Near:    Bilateral Near:     Physical Exam Vitals signs and nursing note reviewed.  Constitutional:      General: She is not in acute distress.    Appearance: She is well-developed.  HENT:     Head: Normocephalic and atraumatic.  Eyes:     Conjunctiva/sclera: Conjunctivae normal.  Neck:     Musculoskeletal: Neck supple.  Cardiovascular:     Rate and Rhythm: Normal rate and regular rhythm.     Heart sounds: No murmur.  Pulmonary:     Effort: Pulmonary effort is normal. No respiratory distress.     Breath sounds: Normal breath sounds.  Abdominal:     Palpations: Abdomen is soft.     Tenderness: There is no abdominal tenderness.  Genitourinary:    General: Normal vulva.     Labia:        Right: No rash.        Left: No rash.      Vagina: Vaginal discharge present.     Cervix: Normal.     Uterus: Normal.      Adnexa: Right adnexa normal and left adnexa normal.     Comments: Scant amount white discharge.  Skin:    General: Skin is warm and dry.  Neurological:     Mental Status: She is alert.      UC Treatments / Results  Labs (all labs ordered are listed, but only abnormal results are displayed) Labs Reviewed  POCT URINALYSIS DIP (DEVICE) - Abnormal; Notable for the following components:      Result Value   Hgb urine dipstick MODERATE (*)    Leukocytes,Ua LARGE (*)    All other components within normal limits  POC URINE PREG, ED  POCT PREGNANCY, URINE  CERVICOVAGINAL ANCILLARY ONLY    EKG None  Radiology No results found.  Procedures Procedures (including critical  care time)  Medications Ordered in UC Medications - No data to display  Initial Impression / Assessment and Plan / UC Course   I have reviewed the triage vital signs and the nursing notes.  Pertinent labs & imaging results that were available during my care of the patient were reviewed by me and considered in my medical decision making (see chart for details).   UTI.  Treating today with Keflex x 7 days.  Discussed with patient that vaginal swab for STDs pending and we will call her if results indicate that she needs additional treatment.  Urine pregnancy negative today.   Final Clinical Impressions(s) / UC Diagnoses   Final diagnoses:  Urinary tract infection with hematuria, site unspecified     Discharge Instructions     Take antibiotic twice a day for 7 days.  Your other vaginal test results are pending and we will call you if you need additional treatment.    ED Prescriptions    Medication Sig Dispense Auth. Provider   cephALEXin (KEFLEX) 500 MG capsule Take 1 capsule (500 mg total) by mouth 2 (two) times daily. 14 capsule Mickie Bailate, [place] Vanaken H, NP     Controlled Substance Prescriptions [place] Controlled Substance Registry consulted? Not Applicable   Mickie Bailate, Dhwani Venkatesh H, NP 04/07/19 1419

## 2019-04-08 LAB — CERVICOVAGINAL ANCILLARY ONLY
Chlamydia: NEGATIVE
Neisseria Gonorrhea: NEGATIVE
Trichomonas: NEGATIVE

## 2019-05-22 ENCOUNTER — Encounter (HOSPITAL_COMMUNITY): Payer: Self-pay

## 2019-05-22 ENCOUNTER — Ambulatory Visit (HOSPITAL_COMMUNITY)
Admission: EM | Admit: 2019-05-22 | Discharge: 2019-05-22 | Disposition: A | Discharge status: Home / Self Care | Payer: Self-pay | Attending: Family Medicine | Admitting: Family Medicine

## 2019-05-22 ENCOUNTER — Other Ambulatory Visit: Payer: Self-pay

## 2019-05-22 DIAGNOSIS — R350 Frequency of micturition: Secondary | ICD-10-CM | POA: Insufficient documentation

## 2019-05-22 DIAGNOSIS — Z3202 Encounter for pregnancy test, result negative: Secondary | ICD-10-CM

## 2019-05-22 LAB — POCT URINALYSIS DIP (DEVICE)
Bilirubin Urine: NEGATIVE
Glucose, UA: NEGATIVE mg/dL
Ketones, ur: NEGATIVE mg/dL
Nitrite: NEGATIVE
Protein, ur: NEGATIVE mg/dL
Specific Gravity, Urine: 1.025 (ref 1.005–1.030)
Urobilinogen, UA: 0.2 mg/dL (ref 0.0–1.0)
pH: 6 (ref 5.0–8.0)

## 2019-05-22 LAB — POCT PREGNANCY, URINE: Preg Test, Ur: NEGATIVE

## 2019-05-22 MED ORDER — FLUCONAZOLE 150 MG PO TABS: 150.0000 mg | ORAL_TABLET | Freq: Every day | ORAL | 0 refills | Status: DC

## 2019-05-22 MED ORDER — CEPHALEXIN 500 MG PO CAPS: 500.0000 mg | ORAL_CAPSULE | Freq: Two times a day (BID) | ORAL | 0 refills | Status: AC

## 2019-05-22 NOTE — ED Provider Notes (Signed)
[place]    CSN: [id number] Arrival date & time: 05/22/19  4098     History   Chief Complaint Chief Complaint  Patient presents with  . Appointment  . Abscess    HPI Marie Holt is a 28 y.o. female.   Patient is a 28 year old female that presents with abscess and possible urinary tract infection.  Reporting some urinary frequency and dysuria over the past 4 days.  Symptoms have been constant.  She also has abscess to labia.  Tender to touch.  No draining.  Denies any fevers.  Denies any vaginal discharge, itching.  Mild irritation to the vaginal area.  History of Bartholin cyst.  Reports this is not as bad as previous.  ROS per HPI      Past Medical History:  Diagnosis Date  . Anxiety   . Asthma   . History of bilateral tubal ligation   . 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 Right ureteral stone   . Yeast infection     Patient Active Problem List   Diagnosis Date Noted  . Dysuria 04/04/2017  . Vaginal discharge 04/04/2017  . History of bilateral tubal ligation 01/11/2017  . Asthma 01/29/2012  . Poor dentition 01/29/2012    Past Surgical History:  Procedure Laterality Date  . CYSTOSCOPY W/ RETROGRADES  06/03/2012   Procedure: CYSTOSCOPY WITH RETROGRADE PYELOGRAM;  Surgeon: Bernestine Amass, MD;  Location: [facility];  Service: Urology;  Laterality: Right;  . TUBAL LIGATION Bilateral 01/11/2017   Procedure: POST PARTUM TUBAL LIGATION;  Surgeon: Lavonia Drafts, MD;  Location: [facility];  Service: Gynecology;  Laterality: Bilateral;    OB History    Gravida  3   Para  3   Term  3   Preterm      AB      Living  3     SAB      TAB      Ectopic      Multiple  0   Live Births  3            Home Medications    Prior to Admission medications   Medication Sig Start Date End Date Taking? Authorizing Provider  cephALEXin (KEFLEX) 500 MG capsule Take 1 capsule (500 mg  total) by mouth 2 (two) times daily for 7 days. 05/22/19 05/29/19  Loura Halt A, NP  fluconazole (DIFLUCAN) 150 MG tablet Take 1 tablet (150 mg total) by mouth daily. One tab now and one tab at the end of the antibiotic treatment. 05/22/19   Orvan July, NP    Family History Family History  Problem Relation Age of Onset  . Hypertension Mother   . Cancer Father        lung  . Cancer Paternal Aunt        breast  . Von Willebrand disease Sister   . Hypertension Maternal Grandmother   . Cancer Paternal Grandmother        unknown origin  . Cancer Paternal Grandfather        unknown origin  . Anesthesia problems Neg Hx     Social History Social History   Tobacco Use  . Smoking status: Never Smoker  . Smokeless tobacco: Never Used  Substance Use Topics  . Alcohol use: No    Comment: occasional  . Drug use: No    Types: Marijuana    Comment: denies any usuage  Allergies   Vicodin [hydrocodone-acetaminophen]   Review of Systems Review of Systems   Physical Exam Triage Vital Signs ED Triage Vitals  Enc Vitals Group     BP 05/22/19 0957 107/75     Pulse Rate 05/22/19 0957 83     Resp 05/22/19 0957 16     Temp 05/22/19 0957 98.6 F (37 C)     Temp src --      SpO2 05/22/19 0957 100 %     Weight 05/22/19 0956 132 lb (59.9 kg)     Height --      Head Circumference --      Peak Flow --      Pain Score 05/22/19 0956 8     Pain Loc --      Pain Edu? --      Excl. in [place]? --    No data found.  Updated Vital Signs BP 107/75 (BP Location: Right Arm)   Pulse 83   Temp 98.6 F (37 C)   Resp 16   Wt 132 lb (59.9 kg)   SpO2 100%   BMI 24.14 kg/m   Visual Acuity Right Eye Distance:   Left Eye Distance:   Bilateral Distance:    Right Eye Near:   Left Eye Near:    Bilateral Near:     Physical Exam Vitals signs and nursing note reviewed.  Constitutional:      General: She is not in acute distress.    Appearance: Normal appearance. She is not  ill-appearing, toxic-appearing or diaphoretic.  HENT:     Head: Normocephalic.     Nose: Nose normal.     Mouth/Throat:     Pharynx: Oropharynx is clear.  Eyes:     Conjunctiva/sclera: Conjunctivae normal.  Neck:     Musculoskeletal: Normal range of motion.  Pulmonary:     Effort: Pulmonary effort is normal.  Abdominal:     Palpations: Abdomen is soft.     Tenderness: There is no abdominal tenderness.  Musculoskeletal: Normal range of motion.  Skin:    General: Skin is warm and dry.     Findings: No rash.  Neurological:     Mental Status: She is alert.  Psychiatric:        Mood and Affect: Mood normal.      UC Treatments / Results  Labs (all labs ordered are listed, but only abnormal results are displayed) Labs Reviewed  POCT URINALYSIS DIP (DEVICE) - Abnormal; Notable for the following components:      Result Value   Hgb urine dipstick TRACE (*)    Leukocytes,Ua TRACE (*)    All other components within normal limits  URINE CULTURE  POC URINE PREG, ED  POCT PREGNANCY, URINE    EKG   Radiology No results found.  Procedures Procedures (including critical care time)  Medications Ordered in UC Medications - No data to display  Initial Impression / Assessment and Plan / UC Course  I have reviewed the triage vital signs and the nursing notes.  Pertinent labs & imaging results that were available during my care of the patient were reviewed by me and considered in my medical decision making (see chart for details).     Urine with trace leuks and hemoglobin.  Sending for culture.  Treating for urinary tract infection with Macrobid awaiting culture results. Negative for pregnancy. Recommend warm compresses to the vaginal area.  History of same female still Bartholin cyst.  Recommended if this continues to  follow-up with OB/GYN for further evaluation and management. Final Clinical Impressions(s) / UC Diagnoses   Final diagnoses:  Urinary frequency      Discharge Instructions     Treating you for a urinary tract infection.  Sending the urine sample for culture. Warm compresses to the area and recommended follow-up with OB/GYN for any continued worsening problems with the cyst.    ED Prescriptions    Medication Sig Dispense Auth. Provider   cephALEXin (KEFLEX) 500 MG capsule Take 1 capsule (500 mg total) by mouth 2 (two) times daily for 7 days. 14 capsule Tahj Lindseth A, NP   fluconazole (DIFLUCAN) 150 MG tablet Take 1 tablet (150 mg total) by mouth daily. One tab now and one tab at the end of the antibiotic treatment. 2 tablet Dahlia ByesBast, Shay Bartoli A, NP     Controlled Substance Prescriptions [place] Controlled Substance Registry consulted? Not Applicable   Janace ArisBast, Wania Longstreth A, NP 05/23/19 [phone]

## 2019-05-22 NOTE — Discharge Instructions (Addendum)
Treating you for a urinary tract infection.  Sending the urine sample for culture. Warm compresses to the area and recommended follow-up with OB/GYN for any continued worsening problems with the cyst.

## 2019-05-22 NOTE — ED Triage Notes (Signed)
Pt states she has a vaginal abscess and UTI. X 4 days.

## 2019-05-24 LAB — URINE CULTURE: Culture: 5000 — AB

## 2019-06-23 ENCOUNTER — Other Ambulatory Visit: Payer: Self-pay

## 2019-06-23 ENCOUNTER — Ambulatory Visit (HOSPITAL_COMMUNITY)
Admission: EM | Admit: 2019-06-23 | Discharge: 2019-06-23 | Disposition: A | Discharge status: Home / Self Care | Payer: Self-pay | Attending: Family Medicine | Admitting: Family Medicine

## 2019-06-23 ENCOUNTER — Encounter (HOSPITAL_COMMUNITY): Payer: Self-pay

## 2019-06-23 DIAGNOSIS — R8281 Pyuria: Secondary | ICD-10-CM | POA: Insufficient documentation

## 2019-06-23 LAB — POCT URINALYSIS DIP (DEVICE)
Bilirubin Urine: NEGATIVE
Glucose, UA: NEGATIVE mg/dL
Ketones, ur: NEGATIVE mg/dL
Nitrite: NEGATIVE
Protein, ur: NEGATIVE mg/dL
Specific Gravity, Urine: >=1.03 (ref 1.005–1.030)
Urobilinogen, UA: 0.2 mg/dL (ref 0.0–1.0)
pH: 5.5 (ref 5.0–8.0)

## 2019-06-23 MED ORDER — CEPHALEXIN 500 MG PO CAPS: 500.0000 mg | ORAL_CAPSULE | Freq: Three times a day (TID) | ORAL | 3 refills | Status: DC

## 2019-06-23 MED ORDER — FLUCONAZOLE 150 MG PO TABS: 150.0000 mg | ORAL_TABLET | Freq: Every day | ORAL | 3 refills | Status: DC

## 2019-06-23 NOTE — ED Triage Notes (Signed)
Patient presents to Urgent Care with complaints of urinary frequency and irritation since a few days ago. Patient reports she just had a UTI, tried to get a PCP appt but nothing was available for several weeks.

## 2019-06-23 NOTE — ED Provider Notes (Signed)
[place]    CSN: [id number] Arrival date & time: 06/23/19  1115      History   Chief Complaint Chief Complaint  Patient presents with  . Urinary Tract Infection    HPI Marie Holt is a 28 y.o. female.   Established [facility] patient  Patient presents to Urgent Care with complaints of urinary frequency and irritation since a few days ago. Patient reports she just had a UTI, tried to get a PCP appt but nothing was available for several weeks.   Irregular periods.  S/P BTL     Past Medical History:  Diagnosis Date  . Anxiety   . Asthma   . History of bilateral tubal ligation   . 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 Right ureteral stone   . Yeast infection     Patient Active Problem List   Diagnosis Date Noted  . Dysuria 04/04/2017  . Vaginal discharge 04/04/2017  . History of bilateral tubal ligation 01/11/2017  . Asthma 01/29/2012  . Poor dentition 01/29/2012    Past Surgical History:  Procedure Laterality Date  . CYSTOSCOPY W/ RETROGRADES  06/03/2012   Procedure: CYSTOSCOPY WITH RETROGRADE PYELOGRAM;  Surgeon: Bernestine Amass, MD;  Location: [facility];  Service: Urology;  Laterality: Right;  . TUBAL LIGATION Bilateral 01/11/2017   Procedure: POST PARTUM TUBAL LIGATION;  Surgeon: Lavonia Drafts, MD;  Location: [facility];  Service: Gynecology;  Laterality: Bilateral;    OB History    Gravida  3   Para  3   Term  3   Preterm      AB      Living  3     SAB      TAB      Ectopic      Multiple  0   Live Births  3            Home Medications    Prior to Admission medications   Medication Sig Start Date End Date Taking? Authorizing Provider  cephALEXin (KEFLEX) 500 MG capsule Take 1 capsule (500 mg total) by mouth 3 (three) times daily. 06/23/19   Robyn Haber, MD  fluconazole (DIFLUCAN) 150 MG tablet Take 1 tablet (150 mg total) by mouth daily. One tab now and one  tab at the end of the antibiotic treatment. 06/23/19   Robyn Haber, MD    Family History Family History  Problem Relation Age of Onset  . Hypertension Mother   . Cancer Father        lung  . Cancer Paternal Aunt        breast  . Von Willebrand disease Sister   . Hypertension Maternal Grandmother   . Cancer Paternal Grandmother        unknown origin  . Cancer Paternal Grandfather        unknown origin  . Anesthesia problems Neg Hx     Social History Social History   Tobacco Use  . Smoking status: Never Smoker  . Smokeless tobacco: Never Used  Substance Use Topics  . Alcohol use: Yes    Comment: occasional  . Drug use: No    Types: Marijuana    Comment: denies any usuage     Allergies   Vicodin [hydrocodone-acetaminophen]   Review of Systems Review of Systems   Physical Exam Triage Vital Signs ED Triage Vitals  Enc Vitals Group     BP 06/23/19 1143 105/83  Pulse Rate 06/23/19 1143 64     Resp 06/23/19 1143 16     Temp 06/23/19 1143 98.4 F (36.9 C)     Temp Source 06/23/19 1143 Temporal     SpO2 06/23/19 1143 100 %     Weight --      Height --      Head Circumference --      Peak Flow --      Pain Score 06/23/19 1141 9     Pain Loc --      Pain Edu? --      Excl. in GC? --    No data found.  Updated Vital Signs BP 105/83 (BP Location: Right Arm)   Pulse 64   Temp 98.4 F (36.9 C) (Temporal)   Resp 16   SpO2 100%    Physical Exam Vitals signs and nursing note reviewed.  Constitutional:      Appearance: Normal appearance.  HENT:     Head: Normocephalic.     Mouth/Throat:     Mouth: Mucous membranes are moist.  Eyes:     Conjunctiva/sclera: Conjunctivae normal.  Neck:     Musculoskeletal: Normal range of motion and neck supple.  Cardiovascular:     Rate and Rhythm: Normal rate.  Pulmonary:     Effort: Pulmonary effort is normal.     Breath sounds: Normal breath sounds.  Abdominal:     Tenderness: There is no abdominal  tenderness.  Musculoskeletal: Normal range of motion.  Skin:    General: Skin is warm and dry.  Neurological:     General: No focal deficit present.     Mental Status: She is alert.  Psychiatric:        Mood and Affect: Mood normal.      UC Treatments / Results  Labs (all labs ordered are listed, but only abnormal results are displayed) Labs Reviewed  POCT URINALYSIS DIP (DEVICE) - Abnormal; Notable for the following components:      Result Value   Hgb urine dipstick TRACE (*)    Leukocytes,Ua TRACE (*)    All other components within normal limits  URINE CULTURE    EKG   Radiology No results found.  Procedures Procedures (including critical care time)  Medications Ordered in UC Medications - No data to display  Initial Impression / Assessment and Plan / UC Course  I have reviewed the triage vital signs and the nursing notes.  Pertinent labs & imaging results that were available during my care of the patient were reviewed by me and considered in my medical decision making (see chart for details).    Final Clinical Impressions(s) / UC Diagnoses   Final diagnoses:  Pyuria   Discharge Instructions   None    ED Prescriptions    Medication Sig Dispense Auth. Provider   fluconazole (DIFLUCAN) 150 MG tablet Take 1 tablet (150 mg total) by mouth daily. One tab now and one tab at the end of the antibiotic treatment. 2 tablet Elvina SidleLauenstein, Delania Ferg, MD   cephALEXin (KEFLEX) 500 MG capsule Take 1 capsule (500 mg total) by mouth 3 (three) times daily. 15 capsule Elvina SidleLauenstein, Ka Flammer, MD     Controlled Substance Prescriptions [place] Controlled Substance Registry consulted? Not Applicable   Elvina SidleLauenstein, Shianne Zeiser, MD 06/23/19 1215

## 2019-06-24 LAB — URINE CULTURE: Culture: NO GROWTH

## 2019-07-29 ENCOUNTER — Other Ambulatory Visit: Payer: Self-pay | Admitting: Obstetrics and Gynecology

## 2019-07-29 ENCOUNTER — Ambulatory Visit: Payer: Self-pay | Admitting: Obstetrics and Gynecology

## 2019-07-29 DIAGNOSIS — Z9851 Tubal ligation status: Secondary | ICD-10-CM

## 2020-04-16 ENCOUNTER — Ambulatory Visit (INDEPENDENT_AMBULATORY_CARE_PROVIDER_SITE_OTHER)
Admission: RE | Admit: 2020-04-16 | Discharge: 2020-04-16 | Disposition: A | Discharge status: Home / Self Care | Payer: Self-pay | Source: Ambulatory Visit

## 2020-04-16 DIAGNOSIS — R3915 Urgency of urination: Secondary | ICD-10-CM

## 2020-04-16 DIAGNOSIS — R300 Dysuria: Secondary | ICD-10-CM

## 2020-04-16 DIAGNOSIS — R350 Frequency of micturition: Secondary | ICD-10-CM

## 2020-04-16 MED ORDER — FLUCONAZOLE 150 MG PO TABS: ORAL_TABLET | ORAL | 0 refills | Status: DC

## 2020-04-16 MED ORDER — NITROFURANTOIN MONOHYD MACRO 100 MG PO CAPS: 100.0000 mg | ORAL_CAPSULE | Freq: Two times a day (BID) | ORAL | 0 refills | Status: DC

## 2020-04-16 NOTE — Discharge Instructions (Signed)
You may have a UTI  I have sent in [facility] for you to take twice daily for 5 days  I have sent in fluconazole for you to take as needed in case of yeast  If you are not feeling better by Monday, follow-up with one of our urgent care offices to drop off urine culture

## 2020-04-16 NOTE — ED Provider Notes (Signed)
[facility] CARE CENTER  Virtual Visit via Video Note:  FAITHANNE VERRET  initiated request for Telemedicine visit with [facility]gent Care team. I connected with [name]  on 04/16/2020 at 5:10 PM  for a synchronized telemedicine visit using a video enabled HIPPA compliant telemedicine application. I verified that I am speaking with [name]  using two identifiers. Marykay Lex, NP  was physically located in a [facility] Urgent care site and MARIACELESTE HERRERA was located at a different location.   The limitations of evaluation and management by telemedicine as well as the availability of in-person appointments were discussed. Patient was informed that she  may incur a bill ( including co-pay) for this virtual visit encounter. Kieryn D Faiella  expressed understanding and gave verbal consent to proceed with virtual visit.   [id number] 04/16/20 Arrival Time: 1702  CC: UTI  SUBJECTIVE: History from: patient.  UNIQUA KIHN is a 29 y.o. female who presents with abrupt onset of dysuria, urinary frequency, urinary urgency, foul-smelling urine for the last 3 days.  Also reports low abdominal pain.  Reports history of UTIs.  Has not taken any over-the-counter medications for this.  Symptoms are made worse with urination.  There are no alleviating symptoms.  Denies fever, chills, fatigue, ear pain, sinus pain, rhinorrhea, nasal congestion, cough, SOB, wheezing, chest pain, nausea, rash, changes in bowel  habits.      ROS: As per HPI.  All other pertinent ROS negative.     Past Medical History:  Diagnosis Date  . Anxiety   . Asthma   . History of bilateral tubal ligation   . 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 Right ureteral stone   . Yeast infection    Past Surgical History:  Procedure Laterality Date  . CYSTOSCOPY W/ RETROGRADES  06/03/2012   Procedure: CYSTOSCOPY WITH RETROGRADE PYELOGRAM;  Surgeon: Valetta Fuller, MD;  Location: [facility];  Service: Urology;  Laterality: Right;  . TUBAL LIGATION Bilateral 01/11/2017   Procedure: POST PARTUM TUBAL LIGATION;  Surgeon: Willodean Rosenthal, MD;  Location: [facility];  Service: Gynecology;  Laterality: Bilateral;   Allergies  Allergen Reactions  . Vicodin [Hydrocodone-Acetaminophen] Nausea And Vomiting   No current facility-administered medications on file prior to encounter.   No current outpatient medications on file prior to encounter.   Social History   Socioeconomic History  . Marital status: Single    Spouse name: Not on file  . Number of children: Not on file  . Years of education: Not on file  . Highest education level: Not on file  Occupational History  . Not on file  Tobacco Use  . Smoking status: Never Smoker  . Smokeless tobacco: Never Used  Vaping Use  . Vaping Use: Never used  Substance and Sexual Activity  . Alcohol use: Yes    Comment: occasional  . Drug use: No    Types: Marijuana    Comment: denies any usuage  . Sexual activity: Yes    Partners: Male    Birth control/protection: None    Comment: Tubal ligation  Other Topics Concern  . Not on file  Social History Narrative  . Not on file   Social Determinants of Health   Financial Resource Strain:   . Difficulty of Paying Living Expenses:   Food Insecurity:   . Worried About Programme researcher, broadcasting/film/video in the Last Year:   .  Ran Out of Food in the Last Year:   Transportation Needs:   . Freight forwarder (Medical):   Marland Kitchen Lack of Transportation (Non-Medical):   Physical Activity:   . Days of Exercise per Week:   . Minutes of Exercise per Session:   Stress:   . Feeling of Stress :   Social Connections:   . Frequency of Communication with Friends and Family:   . Frequency of Social Gatherings with Friends and Family:   . Attends Religious Services:   . Active Member of Clubs or Organizations:   . Attends Banker Meetings:   Marland Kitchen Marital Status:   Intimate Partner  Violence:   . Fear of Current or Ex-Partner:   . Emotionally Abused:   Marland Kitchen Physically Abused:   . Sexually Abused:    Family History  Problem Relation Age of Onset  . Hypertension Mother   . Cancer Father        lung  . Cancer Paternal Aunt        breast  . Von Willebrand disease Sister   . Hypertension Maternal Grandmother   . Cancer Paternal Grandmother        unknown origin  . Cancer Paternal Grandfather        unknown origin  . Anesthesia problems Neg Hx     OBJECTIVE:   There were no vitals filed for this visit.  General appearance: alert; no distress Eyes: EOMI grossly HENT: normocephalic; atraumatic Neck: supple with FROM Lungs: normal respiratory effort; speaking in full sentences without difficulty Extremities: moves extremities without difficulty Skin: No obvious rashes Neurologic: No facial asymmetries Psychological: alert and cooperative; normal mood and affect  ASSESSMENT & PLAN:  1. Dysuria   2. Urinary frequency   3. Urinary urgency     Meds ordered this encounter  Medications  . nitrofurantoin, macrocrystal-monohydrate, (MACROBID) 100 MG capsule    Sig: Take 1 capsule (100 mg total) by mouth 2 (two) times daily.    Dispense:  10 capsule    Refill:  0    Order Specific Question:   Supervising Provider    Answer:   Merrilee Jansky [id number]  . fluconazole (DIFLUCAN) 150 MG tablet    Sig: Take one tablet at the onset of symptoms, if still having symptoms in 3 days, take the second tablet.    Dispense:  2 tablet    Refill:  0    Order Specific Question:   Supervising Provider    Answer:   Merrilee Jansky [id number]     Push fluids and get rest Prescribed Macrobid  Prescribed fluconazole in case of yeast Take as directed and to completion.  May also use over-the-counter Azo If symptoms are not improving by Monday, follow-up with one of our urgent care offices and leave a urine sample for culture Take OTC ibuprofen or tylenol as needed for  pain Follow up with PCP if symptoms persist Return or go to ER if you have any new or worsening symptoms such as fever, chills, nausea, vomiting, worsening sore throat, cough,  chest pain, changes in bowel habits  I discussed the assessment and treatment plan with the patient. The patient was provided an opportunity to ask questions and all were answered. The patient agreed with the plan and demonstrated an understanding of the instructions.   The patient was advised to call back or seek an in-person evaluation if the symptoms worsen or if the condition fails to improve as anticipated.  I  provided 10 minutes of non-face-to-face time during this encounter.  Marykay Lex, NP  04/16/2020 5:10 PM         Moshe Cipro, NP 04/16/20 1710

## 2020-04-20 ENCOUNTER — Ambulatory Visit (INDEPENDENT_AMBULATORY_CARE_PROVIDER_SITE_OTHER)
Admission: RE | Admit: 2020-04-20 | Discharge: 2020-04-20 | Disposition: A | Discharge status: Home / Self Care | Payer: Self-pay | Source: Ambulatory Visit

## 2020-04-20 DIAGNOSIS — R1030 Lower abdominal pain, unspecified: Secondary | ICD-10-CM

## 2020-04-20 NOTE — ED Provider Notes (Signed)
[facility] CARE CENTER  Virtual Visit via Video Note:  CHABELI BARSAMIAN  initiated request for Telemedicine visit with [facility] Urgent Care team. I connected with [name]  on 04/20/2020 at 1:05 PM  for a synchronized telemedicine visit using a video enabled HIPPA compliant telemedicine application. I verified that I am speaking with [name]  using two identifiers. Marykay Lex, NP  was physically located in a [facility] Urgent care site and CALEB PRIGMORE was located at a different location.   The limitations of evaluation and management by telemedicine as well as the availability of in-person appointments were discussed. Patient was informed that she  may incur a bill ( including co-pay) for this virtual visit encounter. Raquell D Mcferran  expressed understanding and gave verbal consent to proceed with virtual visit.   [id number] 04/20/20 Arrival Time: 1245  CC: ABD PAIN  SUBJECTIVE: History from: patient.  ICESIS RENN is a 29 y.o. female who presents with low abdominal pain that has been on and off for the last month.  Has been treated for a UTI as well as BV in the last month.  Reports that this does not feel the same.  Denies having a regular gynecologist.  Reports that she has had her tubes tied in the past.  Reports that she took a pregnancy test yesterday and that it was negative.  There are no aggravating or alleviating factors.  She has not attempted to treat this at home.  Denies fever, chills, fatigue, ear pain, sinus pain, rhinorrhea, nasal congestion, cough, SOB, wheezing, chest pain, nausea, rash, changes in bowel or bladder habits.    ROS: As per HPI.  All other pertinent ROS negative.     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   Right ureteral stone    Yeast infection    Past Surgical History:  Procedure Laterality  Date   CYSTOSCOPY W/ RETROGRADES  06/03/2012   Procedure: CYSTOSCOPY WITH RETROGRADE PYELOGRAM;  Surgeon: Valetta Fuller, MD;  Location: [facility];  Service: Urology;  Laterality: Right;   TUBAL LIGATION Bilateral 01/11/2017   Procedure: POST PARTUM TUBAL LIGATION;  Surgeon: Willodean Rosenthal, MD;  Location: [facility];  Service: Gynecology;  Laterality: Bilateral;   Allergies  Allergen Reactions   Vicodin [Hydrocodone-Acetaminophen] Nausea And Vomiting   No current facility-administered medications on file prior to encounter.   Current Outpatient Medications on File Prior to Encounter  Medication Sig Dispense Refill   fluconazole (DIFLUCAN) 150 MG tablet Take one tablet at the onset of symptoms, if still having symptoms in 3 days, take the second tablet. 2 tablet 0   nitrofurantoin, macrocrystal-monohydrate, (MACROBID) 100 MG capsule Take 1 capsule (100 mg total) by mouth 2 (two) times daily. 10 capsule 0   Social History   Socioeconomic History   Marital status: Single    Spouse name: Not on file   Number of children: Not on file   Years of education: Not on file   Highest education level: Not on file  Occupational History   Not on file  Tobacco Use   Smoking status: Never Smoker   Smokeless tobacco: Never Used  Vaping Use   Vaping Use: Never used  Substance and Sexual Activity   Alcohol use: Yes    Comment: occasional  Drug use: No    Types: Marijuana    Comment: denies any usuage   Sexual activity: Yes    Partners: Male    Birth control/protection: None    Comment: Tubal ligation  Other Topics Concern   Not on file  Social History Narrative   Not on file   Social Determinants of Health   Financial Resource Strain:    Difficulty of Paying Living Expenses:   Food Insecurity:    Worried About Programme researcher, broadcasting/film/video in the Last Year:    Barista in the Last Year:   Transportation Needs:    Freight forwarder  (Medical):    Lack of Transportation (Non-Medical):   Physical Activity:    Days of Exercise per Week:    Minutes of Exercise per Session:   Stress:    Feeling of Stress :   Social Connections:    Frequency of Communication with Friends and Family:    Frequency of Social Gatherings with Friends and Family:    Attends Religious Services:    Active Member of Clubs or Organizations:    Attends Engineer, structural:    Marital Status:   Intimate Partner Violence:    Fear of Current or Ex-Partner:    Emotionally Abused:    Physically Abused:    Sexually Abused:    Family History  Problem Relation Age of Onset   Hypertension Mother    Cancer Father        lung   Cancer Paternal Aunt        breast   Von Willebrand disease Sister    Hypertension Maternal Grandmother    Cancer Paternal Grandmother        unknown origin   Cancer Paternal Grandfather        unknown origin   Anesthesia problems Neg Hx     OBJECTIVE:   There were no vitals filed for this visit.  General appearance: alert; no distress Eyes: EOMI grossly HENT: normocephalic; atraumatic Neck: supple with FROM Lungs: normal respiratory effort; speaking in full sentences without difficulty Extremities: moves extremities without difficulty Skin: No obvious rashes Neurologic: No facial asymmetries Psychological: alert and cooperative; normal mood and affect  ASSESSMENT & PLAN:  1. Lower abdominal pain     Generalized low abdominal pain  Reports that she is not tender anywhere Has been having on and off for a month Irregular periods Has recovered from the UTI that she was treated for last week Denies vaginal discharge Discussed with patient that she would be better served to come into the office and have a pelvic exam and vaginal swab so that we can better treat her Also discussed with patient that she should likely follow-up with gynecology that she does not have  established Will give [facility] for [facility] take OTC ibuprofen or tylenol as needed for pain Follow up with PCP if symptoms persists Return or go to ER if patient has any new or worsening symptoms such as fever, chills, nausea, vomiting, worsening sore throat, cough, abdominal pain, chest pain, changes in bowel or bladder habits.  I discussed the assessment and treatment plan with the patient. The patient was provided an opportunity to ask questions and all were answered. The patient agreed with the plan and demonstrated an understanding of the instructions.   The patient was advised to call back or seek an in-person evaluation if the symptoms worsen or if the condition fails to improve as  anticipated.  I provided 12 minutes of non-face-to-face time during this encounter.  Marykay Lex, NP  04/20/2020 1:05 PM         Moshe Cipro, NP 04/20/20 1305

## 2020-04-20 NOTE — Discharge Instructions (Signed)
[facility]  [address] [place], [place] [address]  [phone]  Call the above number to get set up with gynecology

## 2020-05-10 ENCOUNTER — Inpatient Hospital Stay
Admission: RE | Admit: 2020-05-10 | Discharge: 2020-05-10 | Disposition: A | Discharge status: Home / Self Care | Payer: Self-pay | Source: Ambulatory Visit

## 2020-05-11 ENCOUNTER — Telehealth: Payer: Self-pay

## 2020-05-11 ENCOUNTER — Inpatient Hospital Stay
Admission: RE | Admit: 2020-05-11 | Discharge: 2020-05-11 | Disposition: A | Discharge status: Home / Self Care | Payer: Self-pay | Source: Ambulatory Visit

## 2020-05-11 ENCOUNTER — Telehealth (HOSPITAL_COMMUNITY): Payer: Self-pay

## 2020-05-11 ENCOUNTER — Inpatient Hospital Stay: Admission: RE | Admit: 2020-05-11 | Payer: Self-pay | Source: Ambulatory Visit

## 2020-05-11 ENCOUNTER — Other Ambulatory Visit: Payer: Self-pay

## 2021-10-22 ENCOUNTER — Telehealth: Payer: Self-pay | Admitting: Nurse Practitioner

## 2021-10-22 DIAGNOSIS — N760 Acute vaginitis: Secondary | ICD-10-CM

## 2021-10-22 DIAGNOSIS — B9689 Other specified bacterial agents as the cause of diseases classified elsewhere: Secondary | ICD-10-CM

## 2021-10-22 MED ORDER — METRONIDAZOLE 500 MG PO TABS: 500.0000 mg | ORAL_TABLET | Freq: Two times a day (BID) | ORAL | 0 refills | Status: AC

## 2021-10-22 MED ORDER — FLUCONAZOLE 150 MG PO TABS: ORAL_TABLET | ORAL | 0 refills | Status: DC

## 2021-10-22 NOTE — Progress Notes (Signed)
Virtual Visit Consent   Marie Holt, you are scheduled for a virtual visit with a [facility] provider today.     Just as with appointments in the office, your consent must be obtained to participate.  Your consent will be active for this visit and any virtual visit you may have with one of our providers in the next 365 days.     If you have a MyChart account, a copy of this consent can be sent to you electronically.  All virtual visits are billed to your insurance company just like a traditional visit in the office.    As this is a virtual visit, video technology does not allow for your provider to perform a traditional examination.  This may limit your provider's ability to fully assess your condition.  If your provider identifies any concerns that need to be evaluated in person or the need to arrange testing (such as labs, EKG, etc.), we will make arrangements to do so.     Although advances in technology are sophisticated, we cannot ensure that it will always work on either your end or our end.  If the connection with a video visit is poor, the visit may have to be switched to a telephone visit.  With either a video or telephone visit, we are not always able to ensure that we have a secure connection.     I need to obtain your verbal consent now.   Are you willing to proceed with your visit today?    Marie Holt has provided verbal consent on 10/22/2021 for a virtual visit (video or telephone).   Claiborne Rigg, NP   Date: 10/22/2021 10:53 AM   Virtual Visit via Video Note   I, Claiborne Rigg, connected with  Marie Holt  ([id number], 03/07/1991) on 10/22/21 at 10:45 AM EST by a video-enabled telemedicine application and verified that I am speaking with the correct person using two identifiers.  Location: Patient: Virtual Visit Location Patient: Home Provider: Virtual Visit Location Provider: Home Office   I discussed the limitations of evaluation and management by  telemedicine and the availability of in person appointments. The patient expressed understanding and agreed to proceed.    History of Present Illness: Marie Holt is a 31 y.o. who identifies as a female who was assigned female at birth, and is being seen today for vaginosis.  HPI: States she missed her menstrual cycle at the end of December. It came 2 weeks later in January and this month her cycle has been heavy with a foul odor as well. She denies pregnancy and states she is s/p tubal ligation several years ago. She is sexually active with one partner and denies high risk for STD.   Problems:  Patient Active Problem List   Diagnosis Date Noted   Dysuria 04/04/2017   Vaginal discharge 04/04/2017   History of bilateral tubal ligation 01/11/2017   Asthma 01/29/2012   Poor dentition 01/29/2012    Allergies:  Allergies  Allergen Reactions   Vicodin [Hydrocodone-Acetaminophen] Nausea And Vomiting   Medications:  Current Outpatient Medications:    metroNIDAZOLE (FLAGYL) 500 MG tablet, Take 1 tablet (500 mg total) by mouth 2 (two) times daily for 7 days., Disp: 14 tablet, Rfl: 0   fluconazole (DIFLUCAN) 150 MG tablet, Take one tablet at the onset of symptoms, if still having symptoms in 3 days, take the second tablet., Disp: 2 tablet, Rfl: 0   nitrofurantoin, macrocrystal-monohydrate, (MACROBID) 100 MG  capsule, Take 1 capsule (100 mg total) by mouth 2 (two) times daily., Disp: 10 capsule, Rfl: 0  Observations/Objective: Patient is well-developed, well-nourished in no acute distress.  Resting comfortably  at home.  Head is normocephalic, atraumatic.  No labored breathing.  Speech is clear and coherent with logical content.  Patient is alert and oriented at baseline.    Assessment and Plan: 1. Bacterial vaginosis - fluconazole (DIFLUCAN) 150 MG tablet; Take one tablet at the onset of symptoms, if still having symptoms in 3 days, take the second tablet.  Dispense: 2 tablet; Refill:  0 - metroNIDAZOLE (FLAGYL) 500 MG tablet; Take 1 tablet (500 mg total) by mouth 2 (two) times daily for 7 days.  Dispense: 14 tablet; Refill: 0 F/U with PCP if no improvement in odor or continues with irregular menstrual cycle. May require STD testing/pelvic US.   Follow Up Instructions: I discussed the assessment and treatment plan with the patient. The patient was provided an opportunity to ask questions and all were answered. The patient agreed with the plan and demonstrated an understanding of the instructions.  A copy of instructions were sent to the patient via MyChart unless otherwise noted below.    The patient was advised to call back or seek an in-person evaluation if the symptoms worsen or if the condition fails to improve as anticipated.  Time:  I spent 14 minutes with the patient via telehealth technology discussing the above problems/concerns.    Claiborne Rigg, NP

## 2021-10-22 NOTE — Patient Instructions (Signed)
°  [name], thank you for joining Claiborne Rigg, NP for today's virtual visit.  While this provider is not your primary care provider (PCP), if your PCP is located in our provider database this encounter information will be shared with them immediately following your visit.  Consent: (Patient) Marie Holt provided verbal consent for this virtual visit at the beginning of the encounter.  Current Medications:  Current Outpatient Medications:    metroNIDAZOLE (FLAGYL) 500 MG tablet, Take 1 tablet (500 mg total) by mouth 2 (two) times daily for 7 days., Disp: 14 tablet, Rfl: 0   fluconazole (DIFLUCAN) 150 MG tablet, Take one tablet at the onset of symptoms, if still having symptoms in 3 days, take the second tablet., Disp: 2 tablet, Rfl: 0   nitrofurantoin, macrocrystal-monohydrate, (MACROBID) 100 MG capsule, Take 1 capsule (100 mg total) by mouth 2 (two) times daily., Disp: 10 capsule, Rfl: 0   Medications ordered in this encounter:  Meds ordered this encounter  Medications   fluconazole (DIFLUCAN) 150 MG tablet    Sig: Take one tablet at the onset of symptoms, if still having symptoms in 3 days, take the second tablet.    Dispense:  2 tablet    Refill:  0    Order Specific Question:   Supervising Provider    Answer:   MILLER, BRIAN [[id number]]   metroNIDAZOLE (FLAGYL) 500 MG tablet    Sig: Take 1 tablet (500 mg total) by mouth 2 (two) times daily for 7 days.    Dispense:  14 tablet    Refill:  0    Order Specific Question:   Supervising Provider    Answer:   Hyacinth Meeker, BRIAN [[id number]]     *If you need refills on other medications prior to your next appointment, please contact your pharmacy*  Follow-Up: Call back or seek an in-person evaluation if the symptoms worsen or if the condition fails to improve as anticipated.  Other Instructions F/U with OB/GYN or PCP if cycles continue regular or there is malodorous vaginal discharge which is persistent after completing flagyl.    If you  have been instructed to have an in-person evaluation today at a local Urgent Care facility, please use the link below. It will take you to a list of all of our available [facility], including address, phone number and hours of operation. Please do not delay care.  [facility]  If you or a family member do not have a primary care provider, use the link below to schedule a visit and establish care. When you choose a [facility] primary care physician or advanced practice provider, you gain a long-term partner in health. Find a Primary Care Provider  Learn more about [facility]'s in-office and virtual care options: [facility] - Get Care Now

## 2021-10-29 ENCOUNTER — Encounter: Payer: Self-pay | Admitting: Nurse Practitioner

## 2021-11-28 ENCOUNTER — Telehealth: Payer: Self-pay | Admitting: Physician Assistant

## 2021-11-28 DIAGNOSIS — T3695XA Adverse effect of unspecified systemic antibiotic, initial encounter: Secondary | ICD-10-CM

## 2021-11-28 DIAGNOSIS — R3989 Other symptoms and signs involving the genitourinary system: Secondary | ICD-10-CM

## 2021-11-28 DIAGNOSIS — B379 Candidiasis, unspecified: Secondary | ICD-10-CM

## 2021-11-28 MED ORDER — CEPHALEXIN 500 MG PO CAPS: 500.0000 mg | ORAL_CAPSULE | Freq: Two times a day (BID) | ORAL | 0 refills | Status: AC

## 2021-11-28 MED ORDER — FLUCONAZOLE 150 MG PO TABS: 150.0000 mg | ORAL_TABLET | Freq: Once | ORAL | 0 refills | Status: AC

## 2021-11-28 NOTE — Progress Notes (Signed)
Virtual Visit Consent   Marie Holt, you are scheduled for a virtual visit with a [facility] provider today.     Just as with appointments in the office, your consent must be obtained to participate.  Your consent will be active for this visit and any virtual visit you may have with one of our providers in the next 365 days.     If you have a MyChart account, a copy of this consent can be sent to you electronically.  All virtual visits are billed to your insurance company just like a traditional visit in the office.    As this is a virtual visit, video technology does not allow for your provider to perform a traditional examination.  This may limit your provider's ability to fully assess your condition.  If your provider identifies any concerns that need to be evaluated in person or the need to arrange testing (such as labs, EKG, etc.), we will make arrangements to do so.     Although advances in technology are sophisticated, we cannot ensure that it will always work on either your end or our end.  If the connection with a video visit is poor, the visit may have to be switched to a telephone visit.  With either a video or telephone visit, we are not always able to ensure that we have a secure connection.     I need to obtain your verbal consent now.   Are you willing to proceed with your visit today?    Marie Holt has provided verbal consent on 11/28/2021 for a virtual visit (video or telephone).   Piedad Climes, [place]   Date: 11/28/2021 2:31 PM   Virtual Visit via Video Note   I, Piedad Climes, connected with  Marie Holt  ([id number], [birth year]) on 11/28/21 at  2:30 PM EST by a video-enabled telemedicine application and verified that I am speaking with the correct person using two identifiers.  Location: Patient: Virtual Visit Location Patient: Home Provider: Virtual Visit Location Provider: Home Office   I discussed the limitations of evaluation and management by  telemedicine and the availability of in person appointments. The patient expressed understanding and agreed to proceed.    History of Present Illness: Marie Holt is a 30 y.o. who identifies as a female who was assigned female at birth, and is being seen today for possible UTI.  Notes over the past few days noting urinary urgency, frequency, cloudy urine and dysuria. Denies fever, chills, aches, abdominal pain. Denies nausea or vomiting. Is s/p tubal ligation.  .  Was treated last month for BV with full resolution of symptoms. Notes when she uses antibiotic she ends up with a yeast infection.    HPI: HPI  Problems:  Patient Active Problem List   Diagnosis Date Noted   Dysuria 04/04/2017   Vaginal discharge 04/04/2017   History of bilateral tubal ligation 01/11/2017   Asthma 01/29/2012   Poor dentition 01/29/2012    Allergies:  Allergies  Allergen Reactions   Vicodin [Hydrocodone-Acetaminophen] Nausea And Vomiting   Medications:  Current Outpatient Medications:    cephALEXin (KEFLEX) 500 MG capsule, Take 1 capsule (500 mg total) by mouth 2 (two) times daily for 7 days., Disp: 14 capsule, Rfl: 0   fluconazole (DIFLUCAN) 150 MG tablet, Take 1 tablet (150 mg total) by mouth once for 1 dose., Disp: 1 tablet, Rfl: 0  Observations/Objective: Patient is well-developed, well-nourished in no acute distress.  Resting comfortably  at home.  Head is normocephalic, atraumatic.  No labored breathing. Speech is clear and coherent with logical content.  Patient is alert and oriented at baseline.   Assessment and Plan: 1. Suspected UTI - cephALEXin (KEFLEX) 500 MG capsule; Take 1 capsule (500 mg total) by mouth 2 (two) times daily for 7 days.  Dispense: 14 capsule; Refill: 0  No alarm signs or symptoms. Will empirically treat with Keflex 500 mg BID x 7 days. Supportive measures and OTC medications reviewed. Strict in-person evaluation precautions reviewed.   2. Antibiotic-induced yeast  infection - fluconazole (DIFLUCAN) 150 MG tablet; Take 1 tablet (150 mg total) by mouth once for 1 dose.  Dispense: 1 tablet; Refill: 0  Will give Diflucan in case of antibiotic induced yeast infection from Keflex use. Start daily probiotic.   Follow Up Instructions: I discussed the assessment and treatment plan with the patient. The patient was provided an opportunity to ask questions and all were answered. The patient agreed with the plan and demonstrated an understanding of the instructions.  A copy of instructions were sent to the patient via MyChart unless otherwise noted below.   The patient was advised to call back or seek an in-person evaluation if the symptoms worsen or if the condition fails to improve as anticipated.  Time:  I spent 10 minutes with the patient via telehealth technology discussing the above problems/concerns.    Piedad Climes, PA-C

## 2021-11-28 NOTE — Patient Instructions (Signed)
Marie Holt, thank you for joining Marie Climes, Marie Holt for today's virtual visit.  While this provider is not your primary care provider (PCP), if your PCP is located in our provider database this encounter information will be shared with them immediately following your visit.  Consent: (Patient) Marie Holt provided verbal consent for this virtual visit at the beginning of the encounter.  Current Medications:  Current Outpatient Medications:    fluconazole (DIFLUCAN) 150 MG tablet, Take one tablet at the onset of symptoms, if still having symptoms in 3 days, take the second tablet., Disp: 2 tablet, Rfl: 0   nitrofurantoin, macrocrystal-monohydrate, (MACROBID) 100 MG capsule, Take 1 capsule (100 mg total) by mouth 2 (two) times daily., Disp: 10 capsule, Rfl: 0   Medications ordered in this encounter:  No orders of the defined types were placed in this encounter.    *If you need refills on other medications prior to your next appointment, please contact your pharmacy*  Follow-Up: Call back or seek an in-person evaluation if the symptoms worsen or if the condition fails to improve as anticipated.  Other Instructions Your symptoms are consistent with a bladder infection, also called acute cystitis. Please take your antibiotic (Keflex) as directed until all pills are gone.  Stay very well hydrated.  Consider a daily probiotic (Align, Culturelle, or Activia) to help prevent stomach upset caused by the antibiotic.  Taking a probiotic daily may also help prevent recurrent UTIs.  Also consider taking AZO (Phenazopyridine) tablets to help decrease pain with urination.  Urinary Tract Infection A urinary tract infection (UTI) can occur any place along the urinary tract. The tract includes the kidneys, ureters, bladder, and urethra. A type of germ called bacteria often causes a UTI. UTIs are often helped with antibiotic medicine.  HOME CARE  If given, take antibiotics as told by your  doctor. Finish them even if you start to feel better. Drink enough fluids to keep your pee (urine) clear or pale yellow. Avoid tea, drinks with caffeine, and bubbly (carbonated) drinks. Pee often. Avoid holding your pee in for a long time. Pee before and after having sex (intercourse). Wipe from front to back after you poop (bowel movement) if you are a woman. Use each tissue only once. GET HELP RIGHT AWAY IF:  You have back pain. You have lower belly (abdominal) pain. You have chills. You feel sick to your stomach (nauseous). You throw up (vomit). Your burning or discomfort with peeing does not go away. You have a fever. Your symptoms are not better in 3 days. MAKE SURE YOU:  Understand these instructions. Will watch your condition. Will get help right away if you are not doing well or get worse. Document Released: 03/13/2008 Document Revised: 06/19/2012 Document Reviewed: 04/25/2012 [facility] Patient Information 2015 [place], [place]. This information is not intended to replace advice given to you by your health care provider. Make sure you discuss any questions you have with your health care provider.    If you have been instructed to have an in-person evaluation today at a local Urgent Care facility, please use the link below. It will take you to a list of all of our available [facility], including address, phone number and hours of operation. Please do not delay care.  [facility]  If you or a family member do not have a primary care provider, use the link below to schedule a visit and establish care. When you choose a [facility]  primary care physician or advanced practice provider, you gain a long-term partner in health. Find a Primary Care Provider  Learn more about [facility]'s in-office and virtual care options: [facility] - Get Care Now

## 2021-12-06 ENCOUNTER — Encounter: Payer: Self-pay | Admitting: Nurse Practitioner

## 2021-12-07 ENCOUNTER — Telehealth: Payer: Self-pay | Admitting: Physician Assistant

## 2021-12-07 DIAGNOSIS — B379 Candidiasis, unspecified: Secondary | ICD-10-CM

## 2021-12-07 DIAGNOSIS — T3695XA Adverse effect of unspecified systemic antibiotic, initial encounter: Secondary | ICD-10-CM

## 2021-12-07 MED ORDER — FLUCONAZOLE 150 MG PO TABS: 150.0000 mg | ORAL_TABLET | Freq: Once | ORAL | 0 refills | Status: AC

## 2021-12-07 NOTE — Progress Notes (Signed)
?Virtual Visit Consent  ? ?Marie Holt, you are scheduled for a virtual visit with a [facility] provider today.   ?  ?Just as with appointments in the office, your consent must be obtained to participate.  Your consent will be active for this visit and any virtual visit you may have with one of our providers in the next 365 days.   ?  ?If you have a MyChart account, a copy of this consent can be sent to you electronically.  All virtual visits are billed to your insurance company just like a traditional visit in the office.   ? ?As this is a virtual visit, video technology does not allow for your provider to perform a traditional examination.  This may limit your provider's ability to fully assess your condition.  If your provider identifies any concerns that need to be evaluated in person or the need to arrange testing (such as labs, EKG, etc.), we will make arrangements to do so.   ?  ?Although advances in technology are sophisticated, we cannot ensure that it will always work on either your end or our end.  If the connection with a video visit is poor, the visit may have to be switched to a telephone visit.  With either a video or telephone visit, we are not always able to ensure that we have a secure connection.    ? ?I need to obtain your verbal consent now.   Are you willing to proceed with your visit today?  ?  ?Marie Holt has provided verbal consent on 12/07/2021 for a virtual visit (video or telephone). ?  ?Margaretann Loveless, PA-C  ? ?Date: 12/07/2021 9:56 AM ? ? ?Virtual Visit via Video Note  ? ?Marie Holt, connected with  Marie Holt  ([id number], 03-30-1991) on 12/07/21 at  9:45 AM EST by a video-enabled telemedicine application and verified that I am speaking with the correct person using two identifiers. ? ?Location: ?Patient: Virtual Visit Location Patient: Home ?Provider: Virtual Visit Location Provider: Home Office ?  ?I discussed the limitations of evaluation and management by  telemedicine and the availability of in person appointments. The patient expressed understanding and agreed to proceed.   ? ?History of Present Illness: ?Marie Holt is a 31 y.o. who identifies as a female who was assigned female at birth, and is being seen today for possible yeast infection. ? ?HPI: Vaginal Discharge ?The patient's primary symptoms include genital itching and vaginal discharge. The patient's pertinent negatives include no genital rash. This is a recurrent problem. The current episode started in the past 7 days. The problem occurs constantly. The problem has been gradually worsening. The patient is experiencing no pain. Pertinent negatives include no back pain, chills, dysuria, fever, frequency, nausea or vomiting. The vaginal discharge was white and thick.   ?Patient has a tendency to have yeast infections following antibiotics. Was treated for BV at the beginning of February and had a yeast infection following that, then developed a UTI 2-3 weeks later. Just completed antibiotic for this on Sunday. Had one diflucan she took Sunday night. Symptoms have been progressing. Normally has to take 2 diflucan 3 days apart. ? ?Problems:  ?Patient Active Problem List  ? Diagnosis Date Noted  ? Dysuria 04/04/2017  ? Vaginal discharge 04/04/2017  ? History of bilateral tubal ligation 01/11/2017  ? Asthma 01/29/2012  ? Poor dentition 01/29/2012  ?  ?Allergies:  ?Allergies  ?Allergen Reactions  ? Vicodin [Hydrocodone-Acetaminophen]  Nausea And Vomiting  ? ?Medications:  ?Current Outpatient Medications:  ?  fluconazole (DIFLUCAN) 150 MG tablet, Take 1 tablet (150 mg total) by mouth once for 1 dose. May repeat in 72 hours if needed, Disp: 2 tablet, Rfl: 0 ? ?Observations/Objective: ?Patient is well-developed, well-nourished in no acute distress.  ?Resting comfortably at home.  ?Head is normocephalic, atraumatic.  ?No labored breathing.  ?Speech is clear and coherent with logical content.  ?Patient is alert and  oriented at baseline.  ? ? ?Assessment and Plan: ?1. Antibiotic-induced yeast infection ?- fluconazole (DIFLUCAN) 150 MG tablet; Take 1 tablet (150 mg total) by mouth once for 1 dose. May repeat in 72 hours if needed  Dispense: 2 tablet; Refill: 0 ? ?- Gets yeast infections with antibiotics. Diflucan given ?- If symptoms persist, must be seen in person for testing ? ? ?Follow Up Instructions: ?I discussed the assessment and treatment plan with the patient. The patient was provided an opportunity to ask questions and all were answered. The patient agreed with the plan and demonstrated an understanding of the instructions.  A copy of instructions were sent to the patient via MyChart unless otherwise noted below.  ? ?The patient was advised to call back or seek an in-person evaluation if the symptoms worsen or if the condition fails to improve as anticipated. ? ?Time:  ?I spent 8 minutes with the patient via telehealth technology discussing the above problems/concerns.   ? ?Margaretann Loveless, PA-C ?

## 2021-12-07 NOTE — Patient Instructions (Signed)
?Marie Holt, thank you for joining [name], PA-C for today's virtual visit.  While this provider is not your primary care provider (PCP), if your PCP is located in our provider database this encounter information will be shared with them immediately following your visit. ? ?Consent: ?(Patient) Marie Holt provided verbal consent for this virtual visit at the beginning of the encounter. ? ?Current Medications: ? ?Current Outpatient Medications:  ?  fluconazole (DIFLUCAN) 150 MG tablet, Take 1 tablet (150 mg total) by mouth once for 1 dose. May repeat in 72 hours if needed, Disp: 2 tablet, Rfl: 0  ? ?Medications ordered in this encounter:  ?Meds ordered this encounter  ?Medications  ? fluconazole (DIFLUCAN) 150 MG tablet  ?  Sig: Take 1 tablet (150 mg total) by mouth once for 1 dose. May repeat in 72 hours if needed  ?  Dispense:  2 tablet  ?  Refill:  0  ?  Order Specific Question:   Supervising Provider  ?  Answer:   Noemi Chapel [[id number]]  ?  ? ?*If you need refills on other medications prior to your next appointment, please contact your pharmacy* ? ?Follow-Up: ?Call back or seek an in-person evaluation if the symptoms worsen or if the condition fails to improve as anticipated. ? ?Other Instructions ?Vaginal Yeast Infection, Adult ?Vaginal yeast infection is a condition that causes vaginal discharge as well as soreness, swelling, and redness (inflammation) of the vagina. This is a common condition. Some women get this infection frequently. ?What are the causes? ?This condition is caused by a change in the normal balance of the yeast (Candida) and normal bacteria that live in the vagina. This change causes an overgrowth of yeast, which causes the inflammation. ?What increases the risk? ?The condition is more likely to develop in women who: ?Take antibiotic medicines. ?Have diabetes. ?Take birth control pills. ?Are pregnant. ?Douche often. ?Have a weak body defense system (immune system). ?Have been  taking steroid medicines for a long time. ?Frequently wear tight clothing. ?What are the signs or symptoms? ?Symptoms of this condition include: ?White, thick, creamy vaginal discharge. ?Swelling, itching, redness, and irritation of the vagina. The lips of the vagina (labia) may be affected as well. ?Pain or a burning feeling while urinating. ?Pain during sex. ?How is this diagnosed? ?This condition is diagnosed based on: ?Your medical history. ?A physical exam. ?A pelvic exam. Your health care provider will examine a sample of your vaginal discharge under a microscope. Your health care provider may send this sample for testing to confirm the diagnosis. ?How is this treated? ?This condition is treated with medicine. Medicines may be over-the-counter or prescription. You may be told to use one or more of the following: ?Medicine that is taken by mouth (orally). ?Medicine that is applied as a cream (topically). ?Medicine that is inserted directly into the vagina (suppository). ?Follow these instructions at home: ?Take or apply over-the-counter and prescription medicines only as told by your health care provider. ?Do not use tampons until your health care provider approves. ?Do not have sex until your infection has cleared. Sex can prolong or worsen your symptoms of infection. Ask your health care provider when it is safe to resume sexual activity. ?Keep all follow-up visits. This is important. ?How is this prevented? ? ?Do not wear tight clothes, such as pantyhose or tight pants. ?Wear breathable cotton underwear. ?Do not use douches, perfumed soap, creams, or powders. ?Wipe from front to back after using the toilet. ?  If you have diabetes, keep your blood sugar levels under control. ?Ask your health care provider for other ways to prevent yeast infections. ?Contact a health care provider if: ?You have a fever. ?Your symptoms go away and then return. ?Your symptoms do not get better with treatment. ?Your symptoms get  worse. ?You have new symptoms. ?You develop blisters in or around your vagina. ?You have blood coming from your vagina and it is not your menstrual period. ?You develop pain in your abdomen. ?Summary ?Vaginal yeast infection is a condition that causes discharge as well as soreness, swelling, and redness (inflammation) of the vagina. ?This condition is treated with medicine. Medicines may be over-the-counter or prescription. ?Take or apply over-the-counter and prescription medicines only as told by your health care provider. ?Do not douche. Resume sexual activity or use of tampons as instructed by your health care provider. ?Contact a health care provider if your symptoms do not get better with treatment or your symptoms go away and then return. ?This information is not intended to replace advice given to you by your health care provider. Make sure you discuss any questions you have with your health care provider. ?Document Revised: 12/13/2020 Document Reviewed: 12/13/2020 ?Elsevier Patient Education ? 2022 [employer]. ? ? ? ?If you have been instructed to have an in-person evaluation today at a local Urgent Care facility, please use the link below. It will take you to a list of all of our available [facility], including address, phone number and hours of operation. Please do not delay care.  ?[facility] ? ?If you or a family member do not have a primary care provider, use the link below to schedule a visit and establish care. When you choose a [facility] primary care physician or advanced practice provider, you gain a long-term partner in health. ?Find a Primary Care Provider ? ?Learn more about [facility]'s in-office and virtual care options: ?[facility] Now ?

## 2021-12-22 ENCOUNTER — Telehealth: Payer: Self-pay | Admitting: Physician Assistant

## 2021-12-22 DIAGNOSIS — N390 Urinary tract infection, site not specified: Secondary | ICD-10-CM

## 2021-12-22 MED ORDER — NITROFURANTOIN MONOHYD MACRO 100 MG PO CAPS: 100.0000 mg | ORAL_CAPSULE | Freq: Two times a day (BID) | ORAL | 0 refills | Status: DC

## 2021-12-22 MED ORDER — FLUCONAZOLE 150 MG PO TABS: 150.0000 mg | ORAL_TABLET | Freq: Once | ORAL | 0 refills | Status: AC

## 2021-12-22 NOTE — Patient Instructions (Signed)
?Marie Holt, thank you for joining Piedad Climes, PA-C for today's virtual visit.  While this provider is not your primary care provider (PCP), if your PCP is located in our provider database this encounter information will be shared with them immediately following your visit. ? ?Consent: ?(Patient) Marie Holt provided verbal consent for this virtual visit at the beginning of the encounter. ? ?Current Medications: ? ?Current Outpatient Medications:  ?  fluconazole (DIFLUCAN) 150 MG tablet, Take 1 tablet (150 mg total) by mouth once for 1 dose. Repeat in 3-5 days., Disp: 2 tablet, Rfl: 0 ?  nitrofurantoin, macrocrystal-monohydrate, (MACROBID) 100 MG capsule, Take 1 capsule (100 mg total) by mouth 2 (two) times daily., Disp: 10 capsule, Rfl: 0  ? ?Medications ordered in this encounter:  ?Meds ordered this encounter  ?Medications  ? nitrofurantoin, macrocrystal-monohydrate, (MACROBID) 100 MG capsule  ?  Sig: Take 1 capsule (100 mg total) by mouth 2 (two) times daily.  ?  Dispense:  10 capsule  ?  Refill:  0  ?  Order Specific Question:   Supervising Provider  ?  Answer:   Eber Hong [[id number]]  ? fluconazole (DIFLUCAN) 150 MG tablet  ?  Sig: Take 1 tablet (150 mg total) by mouth once for 1 dose. Repeat in 3-5 days.  ?  Dispense:  2 tablet  ?  Refill:  0  ?  Order Specific Question:   Supervising Provider  ?  Answer:   Eber Hong [[id number]]  ?  ? ?*If you need refills on other medications prior to your next appointment, please contact your pharmacy* ? ?Follow-Up: ?Call back or seek an in-person evaluation if the symptoms worsen or if the condition fails to improve as anticipated. ? ?Other Instructions ?Your symptoms are consistent with a bladder infection, also called acute cystitis. Please take your antibiotic (Macrobid) as directed until all pills are gone.  Stay very well hydrated.  Consider a daily probiotic (Align, Culturelle, or Activia) to help prevent stomach upset caused by the antibiotic.  Taking a  probiotic daily may also help prevent recurrent UTIs.  Also consider taking AZO (Phenazopyridine) tablets to help decrease pain with urination.   ? ?Take the Diflucan as directed. ?I would consider starting a women's probiotic. AZO makes one for vaginal health.  ? ?IF not resolving or if things resolve but there is a recurrence next month after period, you need to be evaluated in person at local Urgent Care if you have been unable to establish with a primary provider/GYN. ? ?Urinary Tract Infection ?A urinary tract infection (UTI) can occur any place along the urinary tract. The tract includes the kidneys, ureters, bladder, and urethra. A type of germ called bacteria often causes a UTI. UTIs are often helped with antibiotic medicine.  ?HOME CARE  ?If given, take antibiotics as told by your doctor. Finish them even if you start to feel better. ?Drink enough fluids to keep your pee (urine) clear or pale yellow. ?Avoid tea, drinks with caffeine, and bubbly (carbonated) drinks. ?Pee often. Avoid holding your pee in for a long time. ?Pee before and after having sex (intercourse). ?Wipe from front to back after you poop (bowel movement) if you are a woman. Use each tissue only once. ?GET HELP RIGHT AWAY IF:  ?You have back pain. ?You have lower belly (abdominal) pain. ?You have chills. ?You feel sick to your stomach (nauseous). ?You throw up (vomit). ?Your burning or discomfort with peeing does not go away. ?You have  a fever. ?Your symptoms are not better in 3 days. ?MAKE SURE YOU:  ?Understand these instructions. ?Will watch your condition. ?Will get help right away if you are not doing well or get worse. ?Document Released: 03/13/2008 Document Revised: 06/19/2012 Document Reviewed: 04/25/2012 ?ExitCare? Patient Information ?2015 [employer]. This information is not intended to replace advice given to you by your health care provider. Make sure you discuss any questions you have with your health care  provider. ? ? ? ?If you have been instructed to have an in-person evaluation today at a local Urgent Care facility, please use the link below. It will take you to a list of all of our available [facility], including address, phone number and hours of operation. Please do not delay care.  ?[facility] ? ?If you or a family member do not have a primary care provider, use the link below to schedule a visit and establish care. When you choose a [facility] primary care physician or advanced practice provider, you gain a long-term partner in health. ?Find a Primary Care Provider ? ?Learn more about [facility]'s in-office and virtual care options: ? - Get Care Now  ?

## 2021-12-22 NOTE — Progress Notes (Signed)
?Virtual Visit Consent  ? ?Marie Holt, you are scheduled for a virtual visit with a [facility] provider today.   ?  ?Just as with appointments in the office, your consent must be obtained to participate.  Your consent will be active for this visit and any virtual visit you may have with one of our providers in the next 365 days.   ?  ?If you have a MyChart account, a copy of this consent can be sent to you electronically.  All virtual visits are billed to your insurance company just like a traditional visit in the office.   ? ?As this is a virtual visit, video technology does not allow for your provider to perform a traditional examination.  This may limit your provider's ability to fully assess your condition.  If your provider identifies any concerns that need to be evaluated in person or the need to arrange testing (such as labs, EKG, etc.), we will make arrangements to do so.   ?  ?Although advances in technology are sophisticated, we cannot ensure that it will always work on either your end or our end.  If the connection with a video visit is poor, the visit may have to be switched to a telephone visit.  With either a video or telephone visit, we are not always able to ensure that we have a secure connection.    ? ?I need to obtain your verbal consent now.   Are you willing to proceed with your visit today?  ?  ?Marie Holt has provided verbal consent on 12/22/2021 for a virtual visit (video or telephone). ?  ?Marie Climes, PA-C  ? ?Date: 12/22/2021 2:18 PM ? ? ?Virtual Visit via Video Note  ? ?IPiedad Holt, connected with  Marie Holt  ([id number], [birth year]) on 12/22/21 at  2:15 PM EDT by a video-enabled telemedicine application and verified that I am speaking with the correct person using two identifiers. ? ?Location: ?Patient: Virtual Visit Location Patient: Home ?Provider: Virtual Visit Location Provider: Home Office ?  ?I discussed the limitations of evaluation and management by  telemedicine and the availability of in person appointments. The patient expressed understanding and agreed to proceed.   ? ?History of Present Illness: ?Marie Holt is a 31 y.o. who identifies as a female who was assigned female at birth, and is being seen today for possible UTI. Notes symptoms about a month ago, treated with Keflex with full resolution. Unfortunately developed a yeast infection with thick, cottage cheese discharge and itching. Was started on Diflucan with resolution of symptoms. Notes she has had issues over the past several months with infection after her menstrual periods. Notes she just had her period in the past week. Now with urinary urgency, frequency, dysuria, hesitancy. Denies fever, chills, nausea, vomiting. Denies flank pain. Denies vaginal symptoms or concern for STI.  States she uses tampons for menstrual bleed without any change in brand/type. Notes her mother stated she may need to switch to pads, etc. She is currently without insurance, awaiting her Medicaid approval. As such has not been able to get in with a PCP or GYN.  ? ? ? ?HPI: HPI  ? ?Problems:  ?Patient Active Problem List  ? Diagnosis Date Noted  ? Dysuria 04/04/2017  ? Vaginal discharge 04/04/2017  ? History of bilateral tubal ligation 01/11/2017  ? Asthma 01/29/2012  ? Poor dentition 01/29/2012  ?  ?Allergies:  ?Allergies  ?Allergen Reactions  ? Vicodin [Hydrocodone-Acetaminophen]  Nausea And Vomiting  ? ?Medications:  ?Current Outpatient Medications:  ?  fluconazole (DIFLUCAN) 150 MG tablet, Take 1 tablet (150 mg total) by mouth once for 1 dose. Repeat in 3-5 days., Disp: 2 tablet, Rfl: 0 ?  nitrofurantoin, macrocrystal-monohydrate, (MACROBID) 100 MG capsule, Take 1 capsule (100 mg total) by mouth 2 (two) times daily., Disp: 10 capsule, Rfl: 0 ? ?Observations/Objective: ?Patient is well-developed, well-nourished in no acute distress.  ?Resting comfortably at home.  ?Head is normocephalic, atraumatic.  ?No labored  breathing. ?Speech is clear and coherent with logical content.  ?Patient is alert and oriented at baseline.  ? ?Assessment and Plan: ?1. Frequent UTI ?- nitrofurantoin, macrocrystal-monohydrate, (MACROBID) 100 MG capsule; Take 1 capsule (100 mg total) by mouth 2 (two) times daily.  Dispense: 10 capsule; Refill: 0 ?- fluconazole (DIFLUCAN) 150 MG tablet; Take 1 tablet (150 mg total) by mouth once for 1 dose. Repeat in 3-5 days.  Dispense: 2 tablet; Refill: 0 ? ?Without PCP or GYN. Awaiting insurance approval to get set up. Suspect possible irritation from feminine hygiene products as potential trigger for these UTI she has had recently. She does need a further evaluation with a GYN/Urologist. Sent link to help get her set up with PCP and GYN so she can see them as soon as her insurance goes into effect. Giving absence of alarm signs/symptoms will start Macrobid and Diflucan. Start vaginal health probiotics. IF not resolving or if any recurrence between resolution of this episode and when she can establish with regular provider, she will need evaluation at Urgent care for examination, UA/culture and vaginal swabbing.  ? ?Follow Up Instructions: ?I discussed the assessment and treatment plan with the patient. The patient was provided an opportunity to ask questions and all were answered. The patient agreed with the plan and demonstrated an understanding of the instructions.  A copy of instructions were sent to the patient via MyChart unless otherwise noted below.  ? ?The patient was advised to call back or seek an in-person evaluation if the symptoms worsen or if the condition fails to improve as anticipated. ? ?Time:  ?I spent 10 minutes with the patient via telehealth technology discussing the above problems/concerns.   ? ?Marie Climes, PA-C ?

## 2022-02-27 ENCOUNTER — Telehealth: Payer: Self-pay | Admitting: Family Medicine

## 2022-02-27 DIAGNOSIS — B370 Candidal stomatitis: Secondary | ICD-10-CM

## 2022-02-27 MED ORDER — NYSTATIN 100000 UNIT/ML MT SUSP: 5.0000 mL | Freq: Four times a day (QID) | OROMUCOSAL | 0 refills | Status: DC

## 2022-02-27 NOTE — Progress Notes (Signed)
Virtual Visit Consent   Marie Holt, you are scheduled for a virtual visit with a [facility] provider today. Just as with appointments in the office, your consent must be obtained to participate. Your consent will be active for this visit and any virtual visit you may have with one of our providers in the next 365 days. If you have a MyChart account, a copy of this consent can be sent to you electronically.  As this is a virtual visit, video technology does not allow for your provider to perform a traditional examination. This may limit your provider's ability to fully assess your condition. If your provider identifies any concerns that need to be evaluated in person or the need to arrange testing (such as labs, EKG, etc.), we will make arrangements to do so. Although advances in technology are sophisticated, we cannot ensure that it will always work on either your end or our end. If the connection with a video visit is poor, the visit may have to be switched to a telephone visit. With either a video or telephone visit, we are not always able to ensure that we have a secure connection.  By engaging in this virtual visit, you consent to the provision of healthcare and authorize for your insurance to be billed (if applicable) for the services provided during this visit. Depending on your insurance coverage, you may receive a charge related to this service.  I need to obtain your verbal consent now. Are you willing to proceed with your visit today? Marie Holt has provided verbal consent on 02/27/2022 for a virtual visit (video or telephone). Freddy Finner, NP  Date: 02/27/2022 2:29 PM  Virtual Visit via Video Note   I, Freddy Finner, connected with  Marie Holt  ([id number], Feb 26, 1991) on 02/27/22 at  2:30 PM EDT by a video-enabled telemedicine application and verified that I am speaking with the correct person using two identifiers.  Location: Patient: Virtual Visit Location Patient:  Home Provider: Virtual Visit Location Provider: Home Office   I discussed the limitations of evaluation and management by telemedicine and the availability of in person appointments. The patient expressed understanding and agreed to proceed.    History of Present Illness: Marie Holt is a 31 y.o. who identifies as a female who was assigned female at birth, and is being seen today for a week or so ago noticed back of tongue was painful. Thought she brushed hard and noticed some white coating and bumps in back. Started having pain on tongue and making it hard to eat. No hx of thrush. No recent new medications. Non smoking.    Problems:  Patient Active Problem List   Diagnosis Date Noted   Dysuria 04/04/2017   Vaginal discharge 04/04/2017   History of bilateral tubal ligation 01/11/2017   Asthma 01/29/2012   Poor dentition 01/29/2012    Allergies:  Allergies  Allergen Reactions   Vicodin [Hydrocodone-Acetaminophen] Nausea And Vomiting   Medications:  Current Outpatient Medications:    nitrofurantoin, macrocrystal-monohydrate, (MACROBID) 100 MG capsule, Take 1 capsule (100 mg total) by mouth 2 (two) times daily., Disp: 10 capsule, Rfl: 0  Observations/Objective: Patient is well-developed, well-nourished in no acute distress.  Resting comfortably  at home.  Head is normocephalic, atraumatic.  No labored breathing.  Speech is clear and coherent with logical content.  Patient is alert and oriented at baseline.  White coat on tongue  Assessment and Plan: 1. Oral thrush  - nystatin (MYCOSTATIN)  100000 UNIT/ML suspension; Take 5 mLs (500,000 Units total) by mouth 4 (four) times daily. Swish and then swallow for the next 7 days.  Dispense: 60 mL; Refill: 0  Hx of dental carries; non smoker- thrush symptoms Tx as per above Advised follow up in person if not improved   Reviewed side effects, risks and benefits of medication.    Patient acknowledged agreement and understanding of  the plan.     Follow Up Instructions: I discussed the assessment and treatment plan with the patient. The patient was provided an opportunity to ask questions and all were answered. The patient agreed with the plan and demonstrated an understanding of the instructions.  A copy of instructions were sent to the patient via MyChart unless otherwise noted below.    The patient was advised to call back or seek an in-person evaluation if the symptoms worsen or if the condition fails to improve as anticipated.  Time:  I spent 10 minutes with the patient via telehealth technology discussing the above problems/concerns.    Freddy Finner, NP

## 2022-02-27 NOTE — Patient Instructions (Signed)
Oral Thrush, Adult Oral thrush is an infection in your mouth and throat and on your tongue. It causes white patches to form in your mouth and on your tongue. Many cases of thrush are mild. But, sometimes, thrush can be serious. People who have a weak body defense system (immune system) or other diseases can be affected more. What are the causes? This condition is caused by a type of fungus called yeast. The fungus is normally present in small amounts in the mouth and nose. If a person has a long-term illness or a weak body defense system, the fungus can grow and spread quickly. This causes thrush. What increases the risk? You are more likely to develop this condition if: You have a weak body defense system. You are an older adult. You have diabetes, cancer, or HIV. You have a dry mouth. You are pregnant or breastfeeding. You do not take good care of your teeth. This risk is greater for people who have false teeth (dentures). You use antibiotic or steroid medicines. What are the signs or symptoms? Symptoms of this condition include: A burning feeling in the mouth and throat. White patches that stick to the mouth and tongue. A bad taste in the mouth or trouble tasting foods. A feeling like you have cotton in your mouth. Pain when you eat and swallow. Not wanting to eat as much as usual. Cracking at the corners of the mouth. How is this treated? This condition is treated with medicines called antifungals. These medicines prevent a fungus from growing. The medicines are either put right on the area (topical) or swallowed (oral). Your doctor will also treat other problems that you may have, such as diabetes or HIV. Follow these instructions at home: Helping with pain and soreness To lessen your pain: Drink cold liquids, like water and iced tea. Eat frozen ice pops or frozen juices. Eat foods that are easy to swallow, like gelatin and ice cream. Drink from a straw if you have too much pain  in your mouth.  General instructions Take or use over-the-counter and prescription medicines only as told by your doctor. Eat plain yogurt that has live cultures in it. Read the label to make sure that there are live cultures in your yogurt. If you wear false teeth: Take them out before you go to bed. Brush them well. Soak them in a cleaner. Rinse your mouth with warm salt-water many times a day. To make the salt-water mixture, dissolve -1 teaspoon (3-6 g) of salt in 1 cup (237 mL) of warm water. Contact a doctor if: Your problems do not get better within 7 days of treatment. Your infection is spreading. This may show as white areas on the skin outside of your mouth. You are nursing your baby and you have redness and pain in the nipples. Summary Oral thrush is an infection in your mouth and throat. It is caused by a fungus. You are more likely to get this condition if you have a weak body defense system. Diseases like diabetes, cancer, or HIV also add to your risk. This condition is treated with medicines called antifungals. Contact a doctor if you do not get better within 7 days of starting treatment. This information is not intended to replace advice given to you by your health care provider. Make sure you discuss any questions you have with your health care provider. Document Revised: 05/25/2021 Document Reviewed: 08/01/2019 Elsevier Patient Education  2023 [employer]  

## 2022-03-30 ENCOUNTER — Telehealth: Payer: Self-pay | Admitting: Physician Assistant

## 2022-03-30 DIAGNOSIS — B370 Candidal stomatitis: Secondary | ICD-10-CM

## 2022-03-30 MED ORDER — NYSTATIN 100000 UNIT/ML MT SUSP: 5.0000 mL | Freq: Four times a day (QID) | OROMUCOSAL | 0 refills | Status: AC

## 2022-03-30 MED ORDER — FLUCONAZOLE 150 MG PO TABS: 150.0000 mg | ORAL_TABLET | ORAL | 0 refills | Status: AC

## 2022-03-30 MED ORDER — NYSTATIN 100000 UNIT/ML MT SUSP: 5.0000 mL | Freq: Four times a day (QID) | OROMUCOSAL | 0 refills | Status: DC

## 2022-03-30 NOTE — Patient Instructions (Signed)
Marie Holt, thank you for joining Piedad Climes, PA-C for today's virtual visit.  While this provider is not your primary care provider (PCP), if your PCP is located in our provider database this encounter information will be shared with them immediately following your visit.  Consent: (Patient) Marie Holt provided verbal consent for this virtual visit at the beginning of the encounter.  Current Medications:  Current Outpatient Medications:    nystatin (MYCOSTATIN) 100000 UNIT/ML suspension, Take 5 mLs (500,000 Units total) by mouth 4 (four) times daily. Swish and then swallow for the next 7 days., Disp: 60 mL, Rfl: 0   Medications ordered in this encounter:  No orders of the defined types were placed in this encounter.    *If you need refills on other medications prior to your next appointment, please contact your pharmacy*  Follow-Up: Call back or seek an in-person evaluation if the symptoms worsen or if the condition fails to improve as anticipated.  Other Instructions Please take the Diflucan and use the Nystatin mouthwash as directed.  Keep hydrated. Start a daily probiotic and Vitamin D3 1000 units daily.   If not resolving or if recurring again, you need to seek an in-person evaluation. DO NOT DELAY CARE.   Oral Thrush, Adult Oral thrush is an infection in your mouth and throat and on your tongue. It causes white patches to form in your mouth and on your tongue. Many cases of thrush are mild. But, sometimes, thrush can be serious. People who have a weak body defense system (immune system) or other diseases can be affected more. What are the causes? This condition is caused by a type of fungus called yeast. The fungus is normally present in small amounts in the mouth and nose. If a person has a long-term illness or a weak body defense system, the fungus can grow and spread quickly. This causes thrush. What increases the risk? You are more likely to develop this  condition if: You have a weak body defense system. You are an older adult. You have diabetes, cancer, or HIV. You have a dry mouth. You are pregnant or breastfeeding. You do not take good care of your teeth. This risk is greater for people who have false teeth (dentures). You use antibiotic or steroid medicines. What are the signs or symptoms? Symptoms of this condition include: A burning feeling in the mouth and throat. White patches that stick to the mouth and tongue. A bad taste in the mouth or trouble tasting foods. A feeling like you have cotton in your mouth. Pain when you eat and swallow. Not wanting to eat as much as usual. Cracking at the corners of the mouth. How is this treated? This condition is treated with medicines called antifungals. These medicines prevent a fungus from growing. The medicines are either put right on the area (topical) or swallowed (oral). Your doctor will also treat other problems that you may have, such as diabetes or HIV. Follow these instructions at home: Helping with pain and soreness To lessen your pain: Drink cold liquids, like water and iced tea. Eat frozen ice pops or frozen juices. Eat foods that are easy to swallow, like gelatin and ice cream. Drink from a straw if you have too much pain in your mouth.  General instructions Take or use over-the-counter and prescription medicines only as told by your doctor. Eat plain yogurt that has live cultures in it. Read the label to make sure that there are live cultures  in your yogurt. If you wear false teeth: Take them out before you go to bed. Brush them well. Soak them in a cleaner. Rinse your mouth with warm salt-water many times a day. To make the salt-water mixture, dissolve -1 teaspoon (3-6 g) of salt in 1 cup (237 mL) of warm water. Contact a doctor if: Your problems do not get better within 7 days of treatment. Your infection is spreading. This may show as white areas on the skin  outside of your mouth. You are nursing your baby and you have redness and pain in the nipples. Summary Oral thrush is an infection in your mouth and throat. It is caused by a fungus. You are more likely to get this condition if you have a weak body defense system. Diseases like diabetes, cancer, or HIV also add to your risk. This condition is treated with medicines called antifungals. Contact a doctor if you do not get better within 7 days of starting treatment. This information is not intended to replace advice given to you by your health care provider. Make sure you discuss any questions you have with your health care provider. Document Revised: 05/25/2021 Document Reviewed: 08/01/2019 Elsevier Patient Education  2023 Elsevier Inc.    If you have been instructed to have an in-person evaluation today at a local Urgent Care facility, please use the link below. It will take you to a list of all of our available [facility], including address, phone number and hours of operation. Please do not delay care.  [facility]  If you or a family member do not have a primary care provider, use the link below to schedule a visit and establish care. When you choose a [facility] primary care physician or advanced practice provider, you gain a long-term partner in health. Find a Primary Care Provider  Learn more about [facility]'s in-office and virtual care options: [facility] - Get Care Now

## 2022-04-10 ENCOUNTER — Telehealth: Payer: Self-pay | Admitting: Physician Assistant

## 2022-04-10 DIAGNOSIS — B370 Candidal stomatitis: Secondary | ICD-10-CM

## 2022-04-10 NOTE — Progress Notes (Signed)
Because you have been treated twice for Thrush, virtually, and most recently on 03/30/22, without resolution of symptoms, I feel your condition warrants further evaluation and I recommend that you be seen in a face to face visit.   NOTE: There will be NO CHARGE for this eVisit   If you are having a true medical emergency please call 911.      For an urgent face to face visit, [facility] has seven urgent care centers for your convenience:     [facility] Center at [facility] Directions [phone] [address] [place], [place] [address]    [facility] Health Urgent Care Center [facility]) Get Driving Directions [phone] [address] [place], [place] [address]  [facility] Health Urgent Care Center [facility]) Get Driving Directions [phone] [address] [place],  [place]  [address]  [facility] Health Urgent Care Center [facility] - at [employer] Directions  [phone] [id number] [place],  [place] [address]   [facility] Health Urgent Care at [facility] Get Driving Directions [phone] [address], [address] [place], [place] [address]   [facility] Health Urgent Care at [facility] Get Driving Directions  [phone] [address] [place], [place] [address]   [facility] Health Urgent Care at [facility] Directions [phone] [address], [address] [place], [place] [address]  Your MyChart E-visit questionnaire answers were reviewed by a board certified advanced clinical practitioner to complete your personal care plan based on your specific symptoms.  Thank you for using e-Visits.   I provided 5 minutes of non face-to-face time during this encounter for chart review and documentation.

## 2022-04-13 ENCOUNTER — Emergency Department
Admission: EM | Admit: 2022-04-13 | Discharge: 2022-04-13 | Disposition: A | Discharge status: Home / Self Care | Payer: Self-pay | Source: Home / Self Care

## 2022-04-13 DIAGNOSIS — K122 Cellulitis and abscess of mouth: Secondary | ICD-10-CM

## 2022-04-13 DIAGNOSIS — B370 Candidal stomatitis: Secondary | ICD-10-CM

## 2022-04-13 LAB — POCT RAPID STREP A (OFFICE): Rapid Strep A Screen: NEGATIVE

## 2022-04-13 MED ORDER — AMOXICILLIN-POT CLAVULANATE 875-125 MG PO TABS: 1.0000 | ORAL_TABLET | Freq: Two times a day (BID) | ORAL | 0 refills | Status: AC

## 2022-04-13 MED ORDER — FLUCONAZOLE 150 MG PO TABS: ORAL_TABLET | ORAL | 0 refills | Status: DC

## 2022-04-13 MED ORDER — NYSTATIN 100000 UNIT/ML MT SUSP: 500000.0000 [IU] | Freq: Four times a day (QID) | OROMUCOSAL | 0 refills | Status: AC

## 2022-04-13 NOTE — Discharge Instructions (Addendum)
Instructed patient to take medications as directed with food to completion.  Advised may use Mycostatin daily or as needed if oral Diflucan is not successful.  Encouraged patient to increase daily water intake while taking these medications.  Advised patient if symptoms worsen and/or unresolved please follow-up with PCP or here for further evaluation.

## 2022-04-13 NOTE — ED Provider Notes (Signed)
[facility]    CSN: [id number] Arrival date & time: 04/13/22  0957      History   Chief Complaint Chief Complaint  Patient presents with   Sore Throat   Otalgia    HPI Marie Holt is a 31 y.o. female.   HPI 31 year old female presents with sore throat for with white layer on tongue for approximately 1 month.  Reports has done 2 virtual visits and prescribed Magic mouthwash and Diflucan.  Reports these medications have helped but symptoms have returned.  Additionally, patient reports bilateral ear pain.  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   Right ureteral stone    Yeast infection     Patient Active Problem List   Diagnosis Date Noted   Dysuria 04/04/2017   Vaginal discharge 04/04/2017   History of bilateral tubal ligation 01/11/2017   Asthma 01/29/2012   Poor dentition 01/29/2012    Past Surgical History:  Procedure Laterality Date   CYSTOSCOPY W/ RETROGRADES  06/03/2012   Procedure: CYSTOSCOPY WITH RETROGRADE PYELOGRAM;  Surgeon: Valetta Fuller, MD;  Location: [facility];  Service: Urology;  Laterality: Right;   TUBAL LIGATION Bilateral 01/11/2017   Procedure: POST PARTUM TUBAL LIGATION;  Surgeon: Willodean Rosenthal, MD;  Location: [facility];  Service: Gynecology;  Laterality: Bilateral;    OB History     Gravida  3   Para  3   Term  3   Preterm      AB      Living  3      SAB      IAB      Ectopic      Multiple  0   Live Births  3            Home Medications    Prior to Admission medications   Medication Sig Start Date End Date Taking? Authorizing Provider  amoxicillin-clavulanate (AUGMENTIN) 875-125 MG tablet Take 1 tablet by mouth 2 (two) times daily for 7 days. 04/13/22 04/20/22 Yes Trevor Iha, FNP  fluconazole (DIFLUCAN) 150 MG tablet Take 1 tab p.o. for oral  candidiasis, may repeat 1 tab p.o. in 3 days if symptoms are not resolved. 04/13/22  Yes Trevor Iha, FNP  naproxen sodium (ALEVE) 220 MG tablet Take 220 mg by mouth.   Yes [provider]  nystatin (MYCOSTATIN) 100000 UNIT/ML suspension Take 5 mLs (500,000 Units total) by mouth 4 (four) times daily for 7 days. 04/13/22 04/20/22 Yes Trevor Iha, FNP    Family History Family History  Problem Relation Age of Onset   Hypertension Mother    Cancer Father        lung   Cancer Paternal Aunt        breast   Von Willebrand disease Sister    Hypertension Maternal Grandmother    Cancer Paternal Grandmother        unknown origin   Cancer Paternal Grandfather        unknown origin   Anesthesia problems Neg Hx     Social History Social History   Tobacco Use   Smoking status: Never   Smokeless tobacco: Never  Vaping Use   Vaping Use: Never used  Substance Use Topics   Alcohol use: Yes    Comment: occasional   Drug use: Not Currently  Allergies   Vicodin [hydrocodone-acetaminophen]   Review of Systems Review of Systems  HENT:  Positive for ear pain, sore throat and trouble swallowing.   All other systems reviewed and are negative.    Physical Exam Triage Vital Signs ED Triage Vitals  Enc Vitals Group     BP      Pulse      Resp      Temp      Temp src      SpO2      Weight      Height      Head Circumference      Peak Flow      Pain Score      Pain Loc      Pain Edu?      Excl. in GC?    No data found.  Updated Vital Signs BP 118/85 (BP Location: Right Arm)   Pulse 62   Temp 98.2 F (36.8 C) (Oral)   Resp 20   Ht 5\' 2"  (1.575 m)   Wt 136 lb (61.7 kg)   LMP 04/09/2022   SpO2 100%   BMI 24.87 kg/m      Physical Exam Vitals and nursing note reviewed.  Constitutional:      Appearance: Normal appearance. She is normal weight. She is not ill-appearing.  HENT:     Head: Normocephalic and atraumatic.     Right Ear: Tympanic membrane, ear  canal and external ear normal.     Left Ear: Tympanic membrane, ear canal and external ear normal.     Mouth/Throat:     Mouth: Mucous membranes are moist.     Pharynx: Oropharynx is clear. Uvula midline. Posterior oropharyngeal erythema and uvula swelling present.     Comments: Fine creamy white patches noted over posterior tongue Eyes:     Extraocular Movements: Extraocular movements intact.     Conjunctiva/sclera: Conjunctivae normal.     Pupils: Pupils are equal, round, and reactive to light.  Cardiovascular:     Rate and Rhythm: Normal rate and regular rhythm.     Pulses: Normal pulses.     Heart sounds: Normal heart sounds.  Pulmonary:     Effort: Pulmonary effort is normal.     Breath sounds: Normal breath sounds. No wheezing, rhonchi or rales.  Musculoskeletal:     Cervical back: Normal range of motion and neck supple. No tenderness.  Lymphadenopathy:     Cervical: Cervical adenopathy present.  Skin:    General: Skin is warm and dry.  Neurological:     General: No focal deficit present.     Mental Status: She is alert and oriented to person, place, and time. Mental status is at baseline.      UC Treatments / Results  Labs (all labs ordered are listed, but only abnormal results are displayed) Labs Reviewed  POCT RAPID STREP A (OFFICE)    EKG   Radiology No results found.  Procedures Procedures (including critical care time)  Medications Ordered in UC Medications - No data to display  Initial Impression / Assessment and Plan / UC Course  I have reviewed the triage vital signs and the nursing notes.  Pertinent labs & imaging results that were available during my care of the patient were reviewed by me and considered in my medical decision making (see chart for details).     MDM: 1.  Uvulitis-Rx'd Augmentin; 2.  Oral candidiasis-Rx'd Diflucan, Mycostatin. Instructed patient to take medications as directed with  food to completion.  Advised may use Mycostatin  daily or as needed if oral Diflucan is not successful.  Encouraged patient to increase daily water intake while taking these medications.  Advised patient if symptoms worsen and/or unresolved please follow-up with PCP or here for further evaluation.  Patient discharged home, hemodynamically stable.  Final Clinical Impressions(s) / UC Diagnoses   Final diagnoses:  Uvulitis  Oral candidiasis     Discharge Instructions      Instructed patient to take medications as directed with food to completion.  Advised may use Mycostatin daily or as needed if oral Diflucan is not successful.  Encouraged patient to increase daily water intake while taking these medications.  Advised patient if symptoms worsen and/or unresolved please follow-up with PCP or here for further evaluation.     ED Prescriptions     Medication Sig Dispense Auth. Provider   amoxicillin-clavulanate (AUGMENTIN) 875-125 MG tablet Take 1 tablet by mouth 2 (two) times daily for 7 days. 14 tablet Trevor Iha, FNP   fluconazole (DIFLUCAN) 150 MG tablet Take 1 tab p.o. for oral candidiasis, may repeat 1 tab p.o. in 3 days if symptoms are not resolved. 4 tablet Trevor Iha, FNP   nystatin (MYCOSTATIN) 100000 UNIT/ML suspension Take 5 mLs (500,000 Units total) by mouth 4 (four) times daily for 7 days. 473 mL Trevor Iha, FNP      PDMP not reviewed this encounter.   Trevor Iha, FNP 04/13/22 1124

## 2022-04-13 NOTE — ED Triage Notes (Signed)
Pt presents to Urgent Care with c/o sore throat w/ "white layer" on tongue x approx one month. Has done 2 virtual visits and was prescribed magic mouthwash and diflucan. States this helped, but symptoms returned. Now states throat hurts worse and has bilateral ear pain.

## 2022-04-27 ENCOUNTER — Telehealth: Payer: Self-pay | Admitting: Physician Assistant

## 2022-04-27 DIAGNOSIS — T3695XA Adverse effect of unspecified systemic antibiotic, initial encounter: Secondary | ICD-10-CM

## 2022-04-27 DIAGNOSIS — B379 Candidiasis, unspecified: Secondary | ICD-10-CM

## 2022-04-27 MED ORDER — FLUCONAZOLE 150 MG PO TABS: 150.0000 mg | ORAL_TABLET | ORAL | 0 refills | Status: DC | PRN

## 2022-04-27 NOTE — Progress Notes (Signed)

## 2022-06-02 ENCOUNTER — Telehealth: Payer: Self-pay | Admitting: Physician Assistant

## 2022-06-02 DIAGNOSIS — B3731 Acute candidiasis of vulva and vagina: Secondary | ICD-10-CM

## 2022-06-02 MED ORDER — FLUCONAZOLE 150 MG PO TABS: 150.0000 mg | ORAL_TABLET | ORAL | 0 refills | Status: DC | PRN

## 2022-06-02 NOTE — Progress Notes (Signed)

## 2022-06-13 ENCOUNTER — Telehealth: Payer: Self-pay | Admitting: Nurse Practitioner

## 2022-06-13 DIAGNOSIS — T3695XA Adverse effect of unspecified systemic antibiotic, initial encounter: Secondary | ICD-10-CM

## 2022-06-13 DIAGNOSIS — N760 Acute vaginitis: Secondary | ICD-10-CM

## 2022-06-13 DIAGNOSIS — B9689 Other specified bacterial agents as the cause of diseases classified elsewhere: Secondary | ICD-10-CM

## 2022-06-13 DIAGNOSIS — B379 Candidiasis, unspecified: Secondary | ICD-10-CM

## 2022-06-13 MED ORDER — METRONIDAZOLE 500 MG PO TABS: 500.0000 mg | ORAL_TABLET | Freq: Two times a day (BID) | ORAL | 0 refills | Status: AC

## 2022-06-13 MED ORDER — FLUCONAZOLE 150 MG PO TABS: 150.0000 mg | ORAL_TABLET | Freq: Once | ORAL | 0 refills | Status: AC

## 2022-06-13 NOTE — Addendum Note (Signed)
Addended by: Harlow Mares on: 06/13/2022 08:41 AM   Modules accepted: Orders

## 2022-06-13 NOTE — Progress Notes (Signed)
E-Visit for Vaginal Symptoms  We are sorry that you are not feeling well. Here is how we plan to help! Based on what you shared with me it looks like you: May have a vaginosis due to bacteria If your symptoms persist after this round of treatment we will need to recommend an in person evaluation for testing.   Vaginosis is an inflammation of the vagina that can result in discharge, itching and pain. The cause is usually a change in the normal balance of vaginal bacteria or an infection. Vaginosis can also result from reduced estrogen levels after menopause.  The most common causes of vaginosis are:   Bacterial vaginosis which results from an overgrowth of one on several organisms that are normally present in your vagina.   Yeast infections which are caused by a naturally occurring fungus called candida.   Vaginal atrophy (atrophic vaginosis) which results from the thinning of the vagina from reduced estrogen levels after menopause.   Trichomoniasis which is caused by a parasite and is commonly transmitted by sexual intercourse.  Factors that increase your risk of developing vaginosis include: Medications, such as antibiotics and steroids Uncontrolled diabetes Use of hygiene products such as bubble bath, vaginal spray or vaginal deodorant Douching Wearing damp or tight-fitting clothing Using an intrauterine device (IUD) for birth control Hormonal changes, such as those associated with pregnancy, birth control pills or menopause Sexual activity Having a sexually transmitted infection  Your treatment plan is Metronidazole or Flagyl 500mg  twice a day for 7 days.  I have electronically sent this prescription into the pharmacy that you have chosen.  Be sure to take all of the medication as directed. Stop taking any medication if you develop a rash, tongue swelling or shortness of breath. Mothers who are breast feeding should consider pumping and discarding their breast milk while on these  antibiotics. However, there is no consensus that infant exposure at these doses would be harmful.  Remember that medication creams can weaken latex condoms.   HOME CARE:  Good hygiene may prevent some types of vaginosis from recurring and may relieve some symptoms:  Avoid baths, hot tubs and whirlpool spas. Rinse soap from your outer genital area after a shower, and dry the area well to prevent irritation. Don't use scented or harsh soaps, such as those with deodorant or antibacterial action. Avoid irritants. These include scented tampons and pads. Wipe from front to back after using the toilet. Doing so avoids spreading fecal bacteria to your vagina.  Other things that may help prevent vaginosis include:  Don't douche. Your vagina doesn't require cleansing other than normal bathing. Repetitive douching disrupts the normal organisms that reside in the vagina and can actually increase your risk of vaginal infection. Douching won't clear up a vaginal infection. Use a latex condom. Both female and female latex condoms may help you avoid infections spread by sexual contact. Wear cotton underwear. Also wear pantyhose with a cotton crotch. If you feel comfortable without it, skip wearing underwear to bed. Yeast thrives in [place] Your symptoms should improve in the next day or two.  GET HELP RIGHT AWAY IF:  You have pain in your lower abdomen ( pelvic area or over your ovaries) You develop nausea or vomiting You develop a fever Your discharge changes or worsens You have persistent pain with intercourse You develop shortness of breath, a rapid pulse, or you faint.  These symptoms could be signs of problems or infections that need to be evaluated by  a medical provider now.  MAKE SURE YOU   Understand these instructions. Will watch your condition. Will get help right away if you are not doing well or get worse.  Thank you for choosing an e-visit.  Your e-visit answers were  reviewed by a board certified advanced clinical practitioner to complete your personal care plan. Depending upon the condition, your plan could have included both over the counter or prescription medications.  Please review your pharmacy choice. Make sure the pharmacy is open so you can pick up prescription now. If there is a problem, you may contact your provider through [employer] and have the prescription routed to another pharmacy.  Your safety is important to [place]. If you have drug allergies check your prescription carefully.   For the next 24 hours you can use MyChart to ask questions about today's visit, request a non-urgent call back, or ask for a work or school excuse. You will get an email in the next two days asking about your experience. I hope that your e-visit has been valuable and will speed your recovery.   I spent approximately 5 minutes reviewing the patient's history, current symptoms and coordinating their plan of care today.    Meds ordered this encounter  Medications   metroNIDAZOLE (FLAGYL) 500 MG tablet    Sig: Take 1 tablet (500 mg total) by mouth 2 (two) times daily for 7 days.    Dispense:  14 tablet    Refill:  0

## 2022-07-02 ENCOUNTER — Telehealth: Payer: Self-pay | Admitting: Family

## 2022-07-02 DIAGNOSIS — B370 Candidal stomatitis: Secondary | ICD-10-CM

## 2022-07-02 MED ORDER — FLUCONAZOLE 150 MG PO TABS: 150.0000 mg | ORAL_TABLET | ORAL | 0 refills | Status: DC | PRN

## 2022-07-02 MED ORDER — NYSTATIN 100000 UNIT/ML MT SUSP: 5.0000 mL | Freq: Four times a day (QID) | OROMUCOSAL | 0 refills | Status: DC

## 2022-07-02 NOTE — Progress Notes (Signed)
  E-Visit for Sore Throat  We are sorry that you are not feeling well.  Here is how we plan to help!  Your symptoms indicate oral thrush. I have sent in nystatin mouthwash you will use four times a day. I have also sent in diflucan 150 mg tablet you will take every three days.  Home Care: Only take medications as instructed by your medical team. Do not drink alcohol while taking these medications. A steam or ultrasonic humidifier can help congestion.  You can place a towel over your head and breathe in the steam from hot water coming from a faucet. Avoid close contacts especially the very young and the elderly. Cover your mouth when you cough or sneeze. Always remember to wash your hands.  Get Help Right Away If: You develop worsening fever or throat pain. You develop a severe head ache or visual changes. Your symptoms persist after you have completed your treatment plan.  Make sure you Understand these instructions. Will watch your condition. Will get help right away if you are not doing well or get worse.   Thank you for choosing an e-visit.  Your e-visit answers were reviewed by a board certified advanced clinical practitioner to complete your personal care plan. Depending upon the condition, your plan could have included both over the counter or prescription medications.  Please review your pharmacy choice. Make sure the pharmacy is open so you can pick up prescription now. If there is a problem, you may contact your provider through [employer] and have the prescription routed to another pharmacy.  Your safety is important to [place]. If you have drug allergies check your prescription carefully.   For the next 24 hours you can use MyChart to ask questions about today's visit, request a non-urgent call back, or ask for a work or school excuse. You will get an email in the next two days asking about your experience. I hope that your e-visit has been valuable and will speed your  recovery.   Approximately 5 minutes was spent documenting and reviewing patient's chart.

## 2022-11-06 ENCOUNTER — Telehealth: Payer: Self-pay | Admitting: Physician Assistant

## 2022-11-06 DIAGNOSIS — B3731 Acute candidiasis of vulva and vagina: Secondary | ICD-10-CM

## 2022-11-06 MED ORDER — FLUCONAZOLE 150 MG PO TABS: 150.0000 mg | ORAL_TABLET | ORAL | 0 refills | Status: DC | PRN

## 2022-11-06 NOTE — Progress Notes (Signed)

## 2022-11-30 ENCOUNTER — Telehealth: Payer: Self-pay | Admitting: Family Medicine

## 2022-11-30 DIAGNOSIS — R3989 Other symptoms and signs involving the genitourinary system: Secondary | ICD-10-CM

## 2022-11-30 DIAGNOSIS — B379 Candidiasis, unspecified: Secondary | ICD-10-CM

## 2022-11-30 DIAGNOSIS — T3695XA Adverse effect of unspecified systemic antibiotic, initial encounter: Secondary | ICD-10-CM

## 2022-11-30 MED ORDER — FLUCONAZOLE 150 MG PO TABS: 150.0000 mg | ORAL_TABLET | Freq: Every day | ORAL | 0 refills | Status: DC

## 2022-11-30 MED ORDER — NITROFURANTOIN MONOHYD MACRO 100 MG PO CAPS: 100.0000 mg | ORAL_CAPSULE | Freq: Two times a day (BID) | ORAL | 0 refills | Status: AC

## 2022-11-30 NOTE — Progress Notes (Signed)
E-Visit for Urinary Problems  We are sorry that you are not feeling well.  Here is how we plan to help!  Based on what you shared with me it looks like you most likely have a simple urinary tract infection.  A UTI (Urinary Tract Infection) is a bacterial infection of the bladder.  Most cases of urinary tract infections are simple to treat but a key part of your care is to encourage you to drink plenty of fluids and watch your symptoms carefully.  I have prescribed MacroBid 100 mg twice a day for 5 days.  Your symptoms should gradually improve. Call us if the burning in your urine worsens, you develop worsening fever, back pain or pelvic pain or if your symptoms do not resolve after completing the antibiotic.  After you complete that medication you can take the tablet for yeast. If your symptoms return you will have to be seen in person for follow up.  Urinary tract infections can be prevented by drinking plenty of water to keep your body hydrated.  Also be sure when you wipe, wipe from front to back and don't hold it in!  If possible, empty your bladder every 4 hours.  HOME CARE Drink plenty of fluids Compete the full course of the antibiotics even if the symptoms resolve Remember, when you need to go.go. Holding in your urine can increase the likelihood of getting a UTI! GET HELP RIGHT AWAY IF: You cannot urinate You get a high fever Worsening back pain occurs You see blood in your urine You feel sick to your stomach or throw up You feel like you are going to pass out  MAKE SURE YOU  Understand these instructions. Will watch your condition. Will get help right away if you are not doing well or get worse.   Thank you for choosing an e-visit.  Your e-visit answers were reviewed by a board certified advanced clinical practitioner to complete your personal care plan. Depending upon the condition, your plan could have included both over the counter or prescription  medications.  Please review your pharmacy choice. Make sure the pharmacy is open so you can pick up prescription now. If there is a problem, you may contact your provider through [employer] and have the prescription routed to another pharmacy.  Your safety is important to [place]. If you have drug allergies check your prescription carefully.   For the next 24 hours you can use MyChart to ask questions about today's visit, request a non-urgent call back, or ask for a work or school excuse. You will get an email in the next two days asking about your experience. I hope that your e-visit has been valuable and will speed your recovery.  I provided 5 minutes of non face-to-face time during this encounter for chart review, medication and order placement, as well as and documentation.

## 2023-06-25 ENCOUNTER — Telehealth: Payer: Self-pay | Admitting: Physician Assistant

## 2023-06-25 DIAGNOSIS — T3695XA Adverse effect of unspecified systemic antibiotic, initial encounter: Secondary | ICD-10-CM

## 2023-06-25 DIAGNOSIS — R3989 Other symptoms and signs involving the genitourinary system: Secondary | ICD-10-CM

## 2023-06-25 DIAGNOSIS — B379 Candidiasis, unspecified: Secondary | ICD-10-CM

## 2023-06-25 MED ORDER — FLUCONAZOLE 150 MG PO TABS: 150.0000 mg | ORAL_TABLET | ORAL | 0 refills | Status: DC | PRN

## 2023-06-25 MED ORDER — NITROFURANTOIN MONOHYD MACRO 100 MG PO CAPS: 100.0000 mg | ORAL_CAPSULE | Freq: Two times a day (BID) | ORAL | 0 refills | Status: DC

## 2023-06-25 NOTE — Progress Notes (Signed)
E-Visit for Urinary Problems  We are sorry that you are not feeling well.  Here is how we plan to help!  Based on what you shared with me it looks like you most likely have a simple urinary tract infection.  A UTI (Urinary Tract Infection) is a bacterial infection of the bladder.  Most cases of urinary tract infections are simple to treat but a key part of your care is to encourage you to drink plenty of fluids and watch your symptoms carefully.  I have prescribed MacroBid 100 mg twice a day for 5 days.  Your symptoms should gradually improve. Call us if the burning in your urine worsens, you develop worsening fever, back pain or pelvic pain or if your symptoms do not resolve after completing the antibiotic.  I have also prescribed Fluconazole 150 mg for possible yeast infection.   Urinary tract infections can be prevented by drinking plenty of water to keep your body hydrated.  Also be sure when you wipe, wipe from front to back and don't hold it in!  If possible, empty your bladder every 4 hours.  HOME CARE Drink plenty of fluids Compete the full course of the antibiotics even if the symptoms resolve Remember, when you need to go.go. Holding in your urine can increase the likelihood of getting a UTI! GET HELP RIGHT AWAY IF: You cannot urinate You get a high fever Worsening back pain occurs You see blood in your urine You feel sick to your stomach or throw up You feel like you are going to pass out  MAKE SURE YOU  Understand these instructions. Will watch your condition. Will get help right away if you are not doing well or get worse.   Thank you for choosing an e-visit.  Your e-visit answers were reviewed by a board certified advanced clinical practitioner to complete your personal care plan. Depending upon the condition, your plan could have included both over the counter or prescription medications.  Please review your pharmacy choice. Make sure the pharmacy is open so you  can pick up prescription now. If there is a problem, you may contact your provider through [employer] and have the prescription routed to another pharmacy.  Your safety is important to [place]. If you have drug allergies check your prescription carefully.   For the next 24 hours you can use MyChart to ask questions about today's visit, request a non-urgent call back, or ask for a work or school excuse. You will get an email in the next two days asking about your experience. I hope that your e-visit has been valuable and will speed your recovery.  I have spent 5 minutes in review of e-visit questionnaire, review and updating patient chart, medical decision making and response to patient.   Margaretann Loveless, PA-C

## 2023-09-04 ENCOUNTER — Telehealth: Payer: Self-pay | Admitting: Family Medicine

## 2023-09-04 DIAGNOSIS — T3695XA Adverse effect of unspecified systemic antibiotic, initial encounter: Secondary | ICD-10-CM

## 2023-09-04 DIAGNOSIS — B9689 Other specified bacterial agents as the cause of diseases classified elsewhere: Secondary | ICD-10-CM

## 2023-09-04 DIAGNOSIS — B379 Candidiasis, unspecified: Secondary | ICD-10-CM

## 2023-09-04 DIAGNOSIS — N760 Acute vaginitis: Secondary | ICD-10-CM

## 2023-09-04 MED ORDER — METRONIDAZOLE 500 MG PO TABS: 500.0000 mg | ORAL_TABLET | Freq: Two times a day (BID) | ORAL | 0 refills | Status: AC

## 2023-09-04 MED ORDER — FLUCONAZOLE 150 MG PO TABS: 150.0000 mg | ORAL_TABLET | Freq: Every day | ORAL | 0 refills | Status: DC

## 2023-09-04 NOTE — Progress Notes (Signed)
E-Visit for Vaginal Symptoms  We are sorry that you are not feeling well. Here is how we plan to help! Based on what you shared with me it looks like you: May have a vaginosis due to bacteria  Vaginosis is an inflammation of the vagina that can result in discharge, itching and pain. The cause is usually a change in the normal balance of vaginal bacteria or an infection. Vaginosis can also result from reduced estrogen levels after menopause.  The most common causes of vaginosis are:   Bacterial vaginosis which results from an overgrowth of one on several organisms that are normally present in your vagina.   Yeast infections which are caused by a naturally occurring fungus called candida.   Vaginal atrophy (atrophic vaginosis) which results from the thinning of the vagina from reduced estrogen levels after menopause.   Trichomoniasis which is caused by a parasite and is commonly transmitted by sexual intercourse.  Factors that increase your risk of developing vaginosis include: Medications, such as antibiotics and steroids Uncontrolled diabetes Use of hygiene products such as bubble bath, vaginal spray or vaginal deodorant Douching Wearing damp or tight-fitting clothing Using an intrauterine device (IUD) for birth control Hormonal changes, such as those associated with pregnancy, birth control pills or menopause Sexual activity Having a sexually transmitted infection  Your treatment plan is Metronidazole or Flagyl 500mg  twice a day for 7 days.  I have electronically sent this prescription into the pharmacy that you have chosen.  We will send in one Diflucan to take after the Flagyl is completed- Probiotics over the counter can help with this as well.  Be sure to take all of the medication as directed. Stop taking any medication if you develop a rash, tongue swelling or shortness of breath. Mothers who are breast feeding should consider pumping and discarding their breast milk while on  these antibiotics. However, there is no consensus that infant exposure at these doses would be harmful.  Remember that medication creams can weaken latex condoms. Marland Kitchen   HOME CARE:  Good hygiene may prevent some types of vaginosis from recurring and may relieve some symptoms:  Avoid baths, hot tubs and whirlpool spas. Rinse soap from your outer genital area after a shower, and dry the area well to prevent irritation. Don't use scented or harsh soaps, such as those with deodorant or antibacterial action. Avoid irritants. These include scented tampons and pads. Wipe from front to back after using the toilet. Doing so avoids spreading fecal bacteria to your vagina.  Other things that may help prevent vaginosis include:  Don't douche. Your vagina doesn't require cleansing other than normal bathing. Repetitive douching disrupts the normal organisms that reside in the vagina and can actually increase your risk of vaginal infection. Douching won't clear up a vaginal infection. Use a latex condom. Both female and female latex condoms may help you avoid infections spread by sexual contact. Wear cotton underwear. Also wear pantyhose with a cotton crotch. If you feel comfortable without it, skip wearing underwear to bed. Yeast thrives in [employer] Your symptoms should improve in the next day or two.  GET HELP RIGHT AWAY IF:  You have pain in your lower abdomen ( pelvic area or over your ovaries) You develop nausea or vomiting You develop a fever Your discharge changes or worsens You have persistent pain with intercourse You develop shortness of breath, a rapid pulse, or you faint.  These symptoms could be signs of problems or infections that need to  be evaluated by a medical provider now.  MAKE SURE YOU   Understand these instructions. Will watch your condition. Will get help right away if you are not doing well or get worse.  Thank you for choosing an e-visit.  Your e-visit answers  were reviewed by a board certified advanced clinical practitioner to complete your personal care plan. Depending upon the condition, your plan could have included both over the counter or prescription medications.  Please review your pharmacy choice. Make sure the pharmacy is open so you can pick up prescription now. If there is a problem, you may contact your provider through [employer] and have the prescription routed to another pharmacy.  Your safety is important to [place]. If you have drug allergies check your prescription carefully.   For the next 24 hours you can use MyChart to ask questions about today's visit, request a non-urgent call back, or ask for a work or school excuse. You will get an email in the next two days asking about your experience. I hope that your e-visit has been valuable and will speed your recovery.  I provided 5 minutes of non face-to-face time during this encounter for chart review, medication and order placement, as well as and documentation.

## 2023-09-27 ENCOUNTER — Telehealth: Payer: Self-pay | Admitting: Physician Assistant

## 2023-09-27 DIAGNOSIS — T3695XA Adverse effect of unspecified systemic antibiotic, initial encounter: Secondary | ICD-10-CM

## 2023-09-27 DIAGNOSIS — B379 Candidiasis, unspecified: Secondary | ICD-10-CM

## 2023-09-27 NOTE — Progress Notes (Signed)

## 2023-09-27 NOTE — Progress Notes (Signed)
I have spent 5 minutes in review of e-visit questionnaire, review and updating patient chart, medical decision making and response to patient.   Mia Milan Cody Jacklynn Dehaas, PA-C    

## 2023-11-29 ENCOUNTER — Telehealth: Payer: Self-pay | Admitting: Physician Assistant

## 2023-11-29 DIAGNOSIS — N760 Acute vaginitis: Secondary | ICD-10-CM

## 2023-11-29 DIAGNOSIS — B9689 Other specified bacterial agents as the cause of diseases classified elsewhere: Secondary | ICD-10-CM

## 2023-11-29 MED ORDER — FLUCONAZOLE 150 MG PO TABS: ORAL_TABLET | ORAL | 0 refills | Status: DC

## 2023-11-29 MED ORDER — METRONIDAZOLE 500 MG PO TABS: 500.0000 mg | ORAL_TABLET | Freq: Two times a day (BID) | ORAL | 0 refills | Status: DC

## 2023-11-29 NOTE — Progress Notes (Signed)
 I have spent 5 minutes in review of e-visit questionnaire, review and updating patient chart, medical decision making and response to patient.   Piedad Climes, PA-C

## 2023-11-29 NOTE — Progress Notes (Signed)

## 2024-03-31 ENCOUNTER — Telehealth: Payer: Self-pay | Admitting: Physician Assistant

## 2024-03-31 DIAGNOSIS — N760 Acute vaginitis: Secondary | ICD-10-CM

## 2024-03-31 MED ORDER — METRONIDAZOLE 500 MG PO TABS: 500.0000 mg | ORAL_TABLET | Freq: Two times a day (BID) | ORAL | 0 refills | Status: DC

## 2024-03-31 NOTE — Progress Notes (Signed)

## 2024-04-01 MED ORDER — METRONIDAZOLE 500 MG PO TABS: 500.0000 mg | ORAL_TABLET | Freq: Two times a day (BID) | ORAL | 0 refills | Status: AC

## 2024-04-01 NOTE — Addendum Note (Signed)
 Addended by: GLADIS ELSIE BROCKS on: 04/01/2024 11:09 AM   Modules accepted: Orders

## 2024-04-15 ENCOUNTER — Telehealth: Payer: Self-pay | Admitting: Nurse Practitioner

## 2024-04-15 DIAGNOSIS — R3989 Other symptoms and signs involving the genitourinary system: Secondary | ICD-10-CM

## 2024-04-15 MED ORDER — CEPHALEXIN 500 MG PO CAPS: 500.0000 mg | ORAL_CAPSULE | Freq: Two times a day (BID) | ORAL | 0 refills | Status: AC

## 2024-04-15 NOTE — Progress Notes (Signed)

## 2024-04-15 NOTE — Progress Notes (Signed)
 I have spent 5 minutes in review of e-visit questionnaire, review and updating patient chart, medical decision making and response to patient.   Piedad Climes, PA-C

## 2024-04-26 ENCOUNTER — Telehealth: Payer: Self-pay | Admitting: Nurse Practitioner

## 2024-04-26 DIAGNOSIS — B3731 Acute candidiasis of vulva and vagina: Secondary | ICD-10-CM

## 2024-04-26 MED ORDER — FLUCONAZOLE 150 MG PO TABS: 150.0000 mg | ORAL_TABLET | ORAL | 0 refills | Status: DC | PRN

## 2024-04-26 NOTE — Progress Notes (Signed)
 I have spent 5 minutes in review of e-visit questionnaire, review and updating patient chart, medical decision making and response to patient.   Claiborne Rigg, NP

## 2024-04-26 NOTE — Progress Notes (Signed)

## 2024-05-17 ENCOUNTER — Telehealth: Payer: Self-pay | Admitting: Nurse Practitioner

## 2024-05-17 DIAGNOSIS — B3731 Acute candidiasis of vulva and vagina: Secondary | ICD-10-CM

## 2024-05-17 DIAGNOSIS — R399 Unspecified symptoms and signs involving the genitourinary system: Secondary | ICD-10-CM

## 2024-05-17 MED ORDER — FLUCONAZOLE 150 MG PO TABS: 150.0000 mg | ORAL_TABLET | ORAL | 0 refills | Status: AC | PRN

## 2024-05-17 MED ORDER — NITROFURANTOIN MONOHYD MACRO 100 MG PO CAPS: 100.0000 mg | ORAL_CAPSULE | Freq: Two times a day (BID) | ORAL | 0 refills | Status: AC

## 2024-05-17 NOTE — Progress Notes (Signed)
 E-Visit for Vaginal Symptoms  While I do understand your transportation issues, I would recommend if your symptoms return that you be seen in person to test your urine and for a vaginal swab to correctly diagnose your symptoms. I have sent a yeast pill at this time.   We are sorry that you are not feeling well. Here is how we plan to help! Based on what you shared with me it looks like you: May have a yeast vaginosis  Vaginosis is an inflammation of the vagina that can result in discharge, itching and pain. The cause is usually a change in the normal balance of vaginal bacteria or an infection. Vaginosis can also result from reduced estrogen levels after menopause.  The most common causes of vaginosis are:   Bacterial vaginosis which results from an overgrowth of one on several organisms that are normally present in your vagina.   Yeast infections which are caused by a naturally occurring fungus called candida.   Vaginal atrophy (atrophic vaginosis) which results from the thinning of the vagina from reduced estrogen levels after menopause.   Trichomoniasis which is caused by a parasite and is commonly transmitted by sexual intercourse.  Factors that increase your risk of developing vaginosis include: Medications, such as antibiotics and steroids Uncontrolled diabetes Use of hygiene products such as bubble bath, vaginal spray or vaginal deodorant Douching Wearing damp or tight-fitting clothing Using an intrauterine device (IUD) for birth control Hormonal changes, such as those associated with pregnancy, birth control pills or menopause Sexual activity Having a sexually transmitted infection  Your treatment plan is Diflucan  (fluconazole ) 150mg  tablets.  I have electronically sent this prescription into the pharmacy that you have chosen.  Be sure to take all of the medication as directed. Stop taking any medication if you develop a rash, tongue swelling or shortness of breath. Mothers  who are breast feeding should consider pumping and discarding their breast milk while on these antibiotics. However, there is no consensus that infant exposure at these doses would be harmful.  Remember that medication creams can weaken latex condoms. SABRA   HOME CARE:  Good hygiene may prevent some types of vaginosis from recurring and may relieve some symptoms:  Avoid baths, hot tubs and whirlpool spas. Rinse soap from your outer genital area after a shower, and dry the area well to prevent irritation. Don't use scented or harsh soaps, such as those with deodorant or antibacterial action. Avoid irritants. These include scented tampons and pads. Wipe from front to back after using the toilet. Doing so avoids spreading fecal bacteria to your vagina.  Other things that may help prevent vaginosis include:  Don't douche. Your vagina doesn't require cleansing other than normal bathing. Repetitive douching disrupts the normal organisms that reside in the vagina and can actually increase your risk of vaginal infection. Douching won't clear up a vaginal infection. Use a latex condom. Both female and female latex condoms may help you avoid infections spread by sexual contact. Wear cotton underwear. Also wear pantyhose with a cotton crotch. If you feel comfortable without it, skip wearing underwear to bed. Yeast thrives in [employer] Your symptoms should improve in the next day or two.  GET HELP RIGHT AWAY IF:  You have pain in your lower abdomen ( pelvic area or over your ovaries) You develop nausea or vomiting You develop a fever Your discharge changes or worsens You have persistent pain with intercourse You develop shortness of breath, a rapid pulse, or you faint.  These symptoms could be signs of problems or infections that need to be evaluated by a medical provider now.  MAKE SURE YOU   Understand these instructions. Will watch your condition. Will get help right away if you are not  doing well or get worse.  Thank you for choosing an e-visit.  Your e-visit answers were reviewed by a board certified advanced clinical practitioner to complete your personal care plan. Depending upon the condition, your plan could have included both over the counter or prescription medications.  Please review your pharmacy choice. Make sure the pharmacy is open so you can pick up prescription now. If there is a problem, you may contact your provider through [employer] and have the prescription routed to another pharmacy.  Your safety is important to us . If you have drug allergies check your prescription carefully.   For the next 24 hours you can use MyChart to ask questions about today's visit, request a non-urgent call back, or ask for a work or school excuse. You will get an email in the next two days asking about your experience. I hope that your e-visit has been valuable and will speed your recovery.

## 2024-05-17 NOTE — Progress Notes (Signed)
 I have spent 5 minutes in review of e-visit questionnaire, review and updating patient chart, medical decision making and response to patient.   Claiborne Rigg, NP

## 2024-05-17 NOTE — Progress Notes (Signed)

## 2024-07-17 ENCOUNTER — Telehealth: Payer: Self-pay | Admitting: Physician Assistant

## 2024-07-17 DIAGNOSIS — H5713 Ocular pain, bilateral: Secondary | ICD-10-CM

## 2024-07-17 NOTE — Progress Notes (Signed)
  Because of severity of eye pain and discomfort with this and need for examination of the eyes and lids, I feel your condition warrants further evaluation and I recommend that you be seen in a face-to-face visit.   NOTE: There will be NO CHARGE for this E-Visit   If you are having a true medical emergency, please call 911.     For an urgent face to face visit, [facility] has multiple urgent care centers for your convenience.  Click the link below for the full list of locations and hours, walk-in wait times, appointment scheduling options and driving directions:  Urgent Care - [place], [place], [place], [place], [place], [place]  [place]     Your MyChart E-visit questionnaire answers were reviewed by a board certified advanced clinical practitioner to complete your personal care plan based on your specific symptoms.    Thank you for using e-Visits.
